# Patient Record
Sex: Female | Born: 1970
Health system: Southern US, Community
[De-identification: ages and names within clinical notes are randomized; demographics above are authoritative.]

## PROBLEM LIST (undated history)

## (undated) DIAGNOSIS — C801 Malignant (primary) neoplasm, unspecified: Secondary | ICD-10-CM

## (undated) DIAGNOSIS — F32A Depression, unspecified: Secondary | ICD-10-CM

## (undated) DIAGNOSIS — R200 Anesthesia of skin: Secondary | ICD-10-CM

## (undated) DIAGNOSIS — R51 Headache: Secondary | ICD-10-CM

## (undated) DIAGNOSIS — R159 Full incontinence of feces: Secondary | ICD-10-CM

## (undated) DIAGNOSIS — G4730 Sleep apnea, unspecified: Secondary | ICD-10-CM

## (undated) DIAGNOSIS — N3281 Overactive bladder: Secondary | ICD-10-CM

## (undated) DIAGNOSIS — F419 Anxiety disorder, unspecified: Secondary | ICD-10-CM

## (undated) DIAGNOSIS — R251 Tremor, unspecified: Secondary | ICD-10-CM

## (undated) DIAGNOSIS — M1990 Unspecified osteoarthritis, unspecified site: Secondary | ICD-10-CM

## (undated) DIAGNOSIS — D649 Anemia, unspecified: Secondary | ICD-10-CM

## (undated) DIAGNOSIS — F329 Major depressive disorder, single episode, unspecified: Secondary | ICD-10-CM

## (undated) HISTORY — PX: TONSILLECTOMY: SUR1361

## (undated) HISTORY — DX: Tremor, unspecified: R25.1

## (undated) HISTORY — PX: OTHER SURGICAL HISTORY: SHX169

## (undated) HISTORY — PX: URETERAL EXPLORATION: SHX2609

## (undated) HISTORY — PX: REMOVAL OF URINARY SLING: SHX6218

## (undated) HISTORY — PX: ABDOMINAL HYSTERECTOMY: SHX81

## (undated) HISTORY — DX: Full incontinence of feces: R15.9

## (undated) HISTORY — PX: VAGINAL HYSTERECTOMY: SUR661

## (undated) HISTORY — DX: Overactive bladder: N32.81

## (undated) HISTORY — PX: DILATION AND CURETTAGE OF UTERUS: SHX78

## (undated) HISTORY — DX: Anesthesia of skin: R20.0

---

## 1997-10-26 HISTORY — PX: COLONOSCOPY: SHX174

## 2000-05-14 ENCOUNTER — Inpatient Hospital Stay (HOSPITAL_COMMUNITY): Admission: AD | Admit: 2000-05-14 | Discharge: 2000-05-14 | Payer: Self-pay | Admitting: *Deleted

## 2000-12-07 ENCOUNTER — Ambulatory Visit (HOSPITAL_BASED_OUTPATIENT_CLINIC_OR_DEPARTMENT_OTHER): Admission: RE | Admit: 2000-12-07 | Discharge: 2000-12-07 | Payer: Self-pay | Admitting: Otolaryngology

## 2004-10-26 HISTORY — PX: FOOT SURGERY: SHX648

## 2012-01-27 ENCOUNTER — Ambulatory Visit: Payer: Self-pay | Admitting: Sports Medicine

## 2012-02-01 ENCOUNTER — Ambulatory Visit (INDEPENDENT_AMBULATORY_CARE_PROVIDER_SITE_OTHER): Payer: BC Managed Care – PPO | Admitting: Sports Medicine

## 2012-02-01 VITALS — BP 133/86 | Ht 63.0 in | Wt 128.0 lb

## 2012-02-01 DIAGNOSIS — M549 Dorsalgia, unspecified: Secondary | ICD-10-CM

## 2012-02-01 MED ORDER — TRAMADOL HCL 50 MG PO TABS: 50.0000 mg | ORAL_TABLET | Freq: Four times a day (QID) | ORAL | Status: AC | PRN

## 2012-02-01 MED ORDER — AMITRIPTYLINE HCL 25 MG PO TABS: 25.0000 mg | ORAL_TABLET | Freq: Every day | ORAL | Status: DC

## 2012-02-01 NOTE — Patient Instructions (Signed)
1. We have given you a medication to take at night which should relax the muscles and one for the daytime which is for pain.  2. Walking daily and gentle stretching motions of your back should also be helpful.  3. Follow up with [place] in about one month.

## 2012-02-01 NOTE — Progress Notes (Signed)
  Subjective:    Patient ID: Megan Gonzalez, female    DOB: 01/10/1971, 41 y.o.   MRN: [id number]  HPI 41 y/o female is here c/o lower back pain since September 2011.  It started after delivery.  She started having back pain immediately at the vaginal delivery.  She has had urinary incontinence since delivery.  She has incontinence due to a prolapse bladder and is currently awaiting surgical repair.  Bowels are controlled.  Back pain is isolated to the lower back.  No leg symptoms.  It is difficult for her to transfer out of a chair.  The pain is a consistent ache.  It is moderately controlled with ibuprofen.  Bending forward increases the pain.  No problem with walking.  She hasn't had any treatments at all for her back pain.  The urologist ordered an MRI.  She was told that the MRI was normal except for arthritis.   Review of Systems     Objective:   Physical Exam  She is in pain as she sits in the chair She transfers unassisted to the exam table Tenderness to palpation over the lumbar spine and paraspinal areas Motion is not restricted although she does get pain with extension No increased pain with FABER Hip girdle strength is decreased, right greater than left DTR's 2+ Negative straight leg raise Sensation is intact     Assessment & Plan:

## 2012-02-03 ENCOUNTER — Encounter: Payer: Self-pay | Admitting: Sports Medicine

## 2012-02-03 DIAGNOSIS — M549 Dorsalgia, unspecified: Secondary | ICD-10-CM | POA: Insufficient documentation

## 2012-02-03 NOTE — Assessment & Plan Note (Addendum)
For now we will be conservative with her treatment.  She will start a walking program and do gentle stretching.  If she continues to have pain after her surgery we will review her previous MRI and plain films to more closely evaluate her facets.  Note review of MRI did not show extensive DJD so I suspect there are other factors in LBP

## 2012-02-23 ENCOUNTER — Other Ambulatory Visit: Payer: Self-pay | Admitting: Urology

## 2012-02-23 MED ORDER — BUPIVACAINE-EPINEPHRINE 0.5% -1:200000 IJ SOLN: 20.0000 mL | Freq: Once | INTRAMUSCULAR | Status: DC

## 2012-02-29 ENCOUNTER — Encounter (HOSPITAL_COMMUNITY): Payer: Self-pay | Admitting: *Deleted

## 2012-02-29 NOTE — Progress Notes (Signed)
Upon obtaining patient's medical history, patient informed me that she had sleep apnea and uses a cpap.  Scheduled a PAT appt.

## 2012-03-02 ENCOUNTER — Ambulatory Visit: Payer: BC Managed Care – PPO | Admitting: Sports Medicine

## 2012-03-09 ENCOUNTER — Encounter (HOSPITAL_COMMUNITY): Payer: Self-pay | Admitting: Pharmacist

## 2012-03-16 ENCOUNTER — Encounter (HOSPITAL_COMMUNITY)
Admission: RE | Admit: 2012-03-16 | Discharge: 2012-03-16 | Disposition: A | Discharge status: Home / Self Care | Payer: BC Managed Care – PPO | Source: Ambulatory Visit | Attending: Obstetrics and Gynecology | Admitting: Obstetrics and Gynecology

## 2012-03-16 ENCOUNTER — Encounter (HOSPITAL_COMMUNITY): Payer: Self-pay

## 2012-03-16 HISTORY — DX: Depression, unspecified: F32.A

## 2012-03-16 HISTORY — DX: Unspecified osteoarthritis, unspecified site: M19.90

## 2012-03-16 HISTORY — DX: Major depressive disorder, single episode, unspecified: F32.9

## 2012-03-16 HISTORY — DX: Headache: R51

## 2012-03-16 HISTORY — DX: Anemia, unspecified: D64.9

## 2012-03-16 LAB — CBC
HCT: 36 % (ref 36.0–46.0)
Hemoglobin: 11.5 g/dL — ABNORMAL LOW (ref 12.0–15.0)
MCV: 81.3 fL (ref 78.0–100.0)
RBC: 4.43 MIL/uL (ref 3.87–5.11)
WBC: 4.3 10*3/uL (ref 4.0–10.5)

## 2012-03-16 NOTE — Patient Instructions (Addendum)
   Your procedure is scheduled on: Thursday May 30th  Enter through the Main Entrance of [facility] at:6am Pick up the phone at the desk and dial [phone] and inform us of your arrival.  Please call this number if you have any problems the morning of surgery: [phone]  Remember: Do not eat food after midnight: Wednesday Do not drink clear liquids after:midnight Wednesday Take these medicines the morning of surgery with a SIP OF WATER: wellbutrin  Do not wear jewelry, make-up, or FINGER nail polish Do not wear lotions, powders, perfumes or deodorant. Do not shave 48 hours prior to surgery. Do not bring valuables to the hospital. Contacts, dentures or bridgework may not be worn into surgery.    Patients discharged on the day of surgery will not be allowed to drive home.     Remember to use your hibiclens as instructed.Please shower with 1/2 bottle the evening before your surgery and the other 1/2 bottle the morning of surgery. Neck down avoiding private area.

## 2012-03-16 NOTE — Pre-Procedure Instructions (Signed)
Pt needed to use wheelchair after arriving to admitting for pat-lower back pain

## 2012-03-17 ENCOUNTER — Other Ambulatory Visit: Payer: Self-pay | Admitting: Obstetrics and Gynecology

## 2012-03-23 MED ORDER — ACETAMINOPHEN 10 MG/ML IV SOLN
1000.0000 mg | Freq: Once | INTRAVENOUS | Status: AC
  Administered 2012-03-24: 1000 mg via INTRAVENOUS
  Filled 2012-03-23: qty 100

## 2012-03-23 MED ORDER — CIPROFLOXACIN IN D5W 400 MG/200ML IV SOLN
400.0000 mg | Freq: Once | INTRAVENOUS | Status: AC
  Administered 2012-03-24: 400 mg via INTRAVENOUS
  Filled 2012-03-23: qty 200

## 2012-03-23 MED ORDER — CIPROFLOXACIN IN D5W 400 MG/200ML IV SOLN
400.0000 mg | INTRAVENOUS | Status: DC
  Filled 2012-03-23: qty 200

## 2012-03-24 ENCOUNTER — Ambulatory Visit (HOSPITAL_COMMUNITY): Payer: BC Managed Care – PPO | Admitting: Anesthesiology

## 2012-03-24 ENCOUNTER — Encounter (HOSPITAL_COMMUNITY): Payer: Self-pay | Admitting: Anesthesiology

## 2012-03-24 ENCOUNTER — Ambulatory Visit (HOSPITAL_COMMUNITY)
Admission: RE | Admit: 2012-03-24 | Discharge: 2012-03-25 | Disposition: A | Discharge status: Home / Self Care | Payer: BC Managed Care – PPO | Source: Ambulatory Visit | Attending: Obstetrics and Gynecology | Admitting: Obstetrics and Gynecology

## 2012-03-24 ENCOUNTER — Encounter (HOSPITAL_COMMUNITY): Payer: Self-pay

## 2012-03-24 ENCOUNTER — Encounter (HOSPITAL_COMMUNITY): Payer: Self-pay | Admitting: *Deleted

## 2012-03-24 ENCOUNTER — Encounter (HOSPITAL_COMMUNITY)
Admission: RE | Disposition: A | Discharge status: Home / Self Care | Payer: Self-pay | Source: Ambulatory Visit | Attending: Obstetrics and Gynecology

## 2012-03-24 DIAGNOSIS — R32 Unspecified urinary incontinence: Secondary | ICD-10-CM

## 2012-03-24 DIAGNOSIS — N920 Excessive and frequent menstruation with regular cycle: Secondary | ICD-10-CM | POA: Insufficient documentation

## 2012-03-24 DIAGNOSIS — N840 Polyp of corpus uteri: Secondary | ICD-10-CM | POA: Insufficient documentation

## 2012-03-24 DIAGNOSIS — M549 Dorsalgia, unspecified: Secondary | ICD-10-CM

## 2012-03-24 DIAGNOSIS — N393 Stress incontinence (female) (male): Secondary | ICD-10-CM | POA: Insufficient documentation

## 2012-03-24 HISTORY — DX: Sleep apnea, unspecified: G47.30

## 2012-03-24 HISTORY — PX: PUBOVAGINAL SLING: SHX1035

## 2012-03-24 LAB — PREGNANCY, URINE: Preg Test, Ur: NEGATIVE

## 2012-03-24 SURGERY — DILATATION & CURETTAGE/HYSTEROSCOPY WITH RESECTOCOPE
Anesthesia: General | Site: Vagina | Wound class: Clean Contaminated

## 2012-03-24 MED ORDER — KETOROLAC TROMETHAMINE 30 MG/ML IJ SOLN
INTRAMUSCULAR | Status: DC | PRN
  Administered 2012-03-24: 30 mg via INTRAVENOUS

## 2012-03-24 MED ORDER — ONDANSETRON HCL 4 MG/2ML IJ SOLN
INTRAMUSCULAR | Status: AC
  Filled 2012-03-24: qty 2

## 2012-03-24 MED ORDER — KETOROLAC TROMETHAMINE 30 MG/ML IJ SOLN: 15.0000 mg | Freq: Once | INTRAMUSCULAR | Status: DC | PRN

## 2012-03-24 MED ORDER — CIPROFLOXACIN IN D5W 400 MG/200ML IV SOLN
400.0000 mg | Freq: Two times a day (BID) | INTRAVENOUS | Status: AC
  Administered 2012-03-24 – 2012-03-25 (×2): 400 mg via INTRAVENOUS
  Filled 2012-03-24 (×2): qty 200

## 2012-03-24 MED ORDER — LIDOCAINE HCL (CARDIAC) 20 MG/ML IV SOLN
INTRAVENOUS | Status: AC
  Filled 2012-03-24: qty 5

## 2012-03-24 MED ORDER — BUPIVACAINE-EPINEPHRINE PF 0.25-1:200000 % IJ SOLN
INTRAMUSCULAR | Status: AC
  Filled 2012-03-24: qty 30

## 2012-03-24 MED ORDER — ZOLPIDEM TARTRATE 5 MG PO TABS: 5.0000 mg | ORAL_TABLET | Freq: Every evening | ORAL | Status: DC | PRN

## 2012-03-24 MED ORDER — KETOROLAC TROMETHAMINE 30 MG/ML IJ SOLN: 30.0000 mg | Freq: Four times a day (QID) | INTRAMUSCULAR | Status: DC

## 2012-03-24 MED ORDER — MIDAZOLAM HCL 2 MG/2ML IJ SOLN
INTRAMUSCULAR | Status: AC
  Filled 2012-03-24: qty 2

## 2012-03-24 MED ORDER — ACETAMINOPHEN 10 MG/ML IV SOLN
1000.0000 mg | Freq: Four times a day (QID) | INTRAVENOUS | Status: DC
  Administered 2012-03-24 – 2012-03-25 (×3): 1000 mg via INTRAVENOUS
  Filled 2012-03-24 (×5): qty 100

## 2012-03-24 MED ORDER — DIPHENHYDRAMINE HCL 12.5 MG/5ML PO ELIX: 12.5000 mg | ORAL_SOLUTION | Freq: Four times a day (QID) | ORAL | Status: DC | PRN

## 2012-03-24 MED ORDER — LACTATED RINGERS IV SOLN
INTRAVENOUS | Status: DC
  Administered 2012-03-24 (×2): via INTRAVENOUS

## 2012-03-24 MED ORDER — STERILE WATER FOR IRRIGATION IR SOLN
Status: DC | PRN
  Administered 2012-03-24: 1000 mL via INTRAVESICAL

## 2012-03-24 MED ORDER — PROPOFOL 10 MG/ML IV EMUL
INTRAVENOUS | Status: DC | PRN
  Administered 2012-03-24: 160 mg via INTRAVENOUS

## 2012-03-24 MED ORDER — ESTRADIOL 0.1 MG/GM VA CREA
TOPICAL_CREAM | VAGINAL | Status: DC | PRN
  Administered 2012-03-24: 1 via VAGINAL

## 2012-03-24 MED ORDER — BELLADONNA ALKALOIDS-OPIUM 16.2-60 MG RE SUPP
1.0000 | Freq: Once | RECTAL | Status: AC
  Administered 2012-03-24: 1 via RECTAL

## 2012-03-24 MED ORDER — AMPHETAMINE-DEXTROAMPHET ER 25 MG PO CP24: 25.0000 mg | ORAL_CAPSULE | Freq: Two times a day (BID) | ORAL | Status: DC

## 2012-03-24 MED ORDER — FENTANYL CITRATE 0.05 MG/ML IJ SOLN
INTRAMUSCULAR | Status: DC | PRN
  Administered 2012-03-24: 100 ug via INTRAVENOUS

## 2012-03-24 MED ORDER — CHLOROPROCAINE HCL 1 % IJ SOLN
INTRAMUSCULAR | Status: DC | PRN
  Administered 2012-03-24: 10 mL

## 2012-03-24 MED ORDER — ONDANSETRON HCL 4 MG/2ML IJ SOLN
4.0000 mg | INTRAMUSCULAR | Status: DC | PRN
  Filled 2012-03-24: qty 2

## 2012-03-24 MED ORDER — LIDOCAINE HCL (CARDIAC) 20 MG/ML IV SOLN
INTRAVENOUS | Status: DC | PRN
  Administered 2012-03-24: 50 mg via INTRAVENOUS

## 2012-03-24 MED ORDER — FENTANYL CITRATE 0.05 MG/ML IJ SOLN
INTRAMUSCULAR | Status: AC
  Administered 2012-03-24: 50 ug via INTRAVENOUS
  Filled 2012-03-24: qty 2

## 2012-03-24 MED ORDER — GLYCINE 1.5 % IR SOLN
Status: DC | PRN
  Administered 2012-03-24: 3000 mL

## 2012-03-24 MED ORDER — CHLOROPROCAINE HCL 1 % IJ SOLN
INTRAMUSCULAR | Status: AC
  Filled 2012-03-24: qty 30

## 2012-03-24 MED ORDER — FENTANYL CITRATE 0.05 MG/ML IJ SOLN
25.0000 ug | INTRAMUSCULAR | Status: DC | PRN
  Administered 2012-03-24: 50 ug via INTRAVENOUS

## 2012-03-24 MED ORDER — INDIGOTINDISULFONATE SODIUM 8 MG/ML IJ SOLN
INTRAMUSCULAR | Status: DC | PRN
  Administered 2012-03-24: 40 mg via INTRAVENOUS

## 2012-03-24 MED ORDER — BUPROPION HCL ER (XL) 300 MG PO TB24
450.0000 mg | ORAL_TABLET | Freq: Every morning | ORAL | Status: DC
  Administered 2012-03-25: 450 mg via ORAL
  Filled 2012-03-24: qty 1

## 2012-03-24 MED ORDER — KETOROLAC TROMETHAMINE 30 MG/ML IJ SOLN
30.0000 mg | Freq: Four times a day (QID) | INTRAMUSCULAR | Status: DC
  Filled 2012-03-24: qty 1

## 2012-03-24 MED ORDER — BISACODYL 10 MG RE SUPP: 10.0000 mg | Freq: Every day | RECTAL | Status: DC | PRN

## 2012-03-24 MED ORDER — DEXAMETHASONE SODIUM PHOSPHATE 10 MG/ML IJ SOLN
INTRAMUSCULAR | Status: AC
  Filled 2012-03-24: qty 1

## 2012-03-24 MED ORDER — SODIUM CHLORIDE 0.9 % IR SOLN: 1.0000 "application " | Freq: Once | Status: DC

## 2012-03-24 MED ORDER — MIDAZOLAM HCL 5 MG/5ML IJ SOLN
INTRAMUSCULAR | Status: DC | PRN
  Administered 2012-03-24: 2 mg via INTRAVENOUS

## 2012-03-24 MED ORDER — BELLADONNA ALKALOIDS-OPIUM 16.2-60 MG RE SUPP
RECTAL | Status: AC
  Filled 2012-03-24: qty 1

## 2012-03-24 MED ORDER — LORATADINE 10 MG PO TABS
10.0000 mg | ORAL_TABLET | Freq: Every morning | ORAL | Status: DC
  Administered 2012-03-25: 10 mg via ORAL
  Filled 2012-03-24 (×2): qty 1

## 2012-03-24 MED ORDER — ONDANSETRON HCL 4 MG/2ML IJ SOLN
4.0000 mg | Freq: Four times a day (QID) | INTRAMUSCULAR | Status: DC | PRN
  Administered 2012-03-24 (×2): 4 mg via INTRAVENOUS
  Filled 2012-03-24: qty 2

## 2012-03-24 MED ORDER — MENTHOL 3 MG MT LOZG: 1.0000 | LOZENGE | OROMUCOSAL | Status: DC | PRN

## 2012-03-24 MED ORDER — KETOROLAC TROMETHAMINE 30 MG/ML IJ SOLN
30.0000 mg | Freq: Four times a day (QID) | INTRAMUSCULAR | Status: DC
  Administered 2012-03-24 – 2012-03-25 (×4): 30 mg via INTRAVENOUS
  Filled 2012-03-24 (×3): qty 1

## 2012-03-24 MED ORDER — INDIGOTINDISULFONATE SODIUM 8 MG/ML IJ SOLN
INTRAMUSCULAR | Status: AC
  Filled 2012-03-24: qty 5

## 2012-03-24 MED ORDER — FENTANYL CITRATE 0.05 MG/ML IJ SOLN
INTRAMUSCULAR | Status: AC
  Filled 2012-03-24: qty 5

## 2012-03-24 MED ORDER — SILVER NITRATE-POT NITRATE 75-25 % EX MISC
CUTANEOUS | Status: AC
  Filled 2012-03-24: qty 6

## 2012-03-24 MED ORDER — CIPROFLOXACIN IN D5W 400 MG/200ML IV SOLN
400.0000 mg | Freq: Two times a day (BID) | INTRAVENOUS | Status: DC
  Filled 2012-03-24 (×2): qty 200

## 2012-03-24 MED ORDER — DEXAMETHASONE SODIUM PHOSPHATE 10 MG/ML IJ SOLN
INTRAMUSCULAR | Status: DC | PRN
  Administered 2012-03-24: 10 mg via INTRAVENOUS

## 2012-03-24 MED ORDER — ACETAMINOPHEN 10 MG/ML IV SOLN: 1000.0000 mg | Freq: Four times a day (QID) | INTRAVENOUS | Status: DC

## 2012-03-24 MED ORDER — PANTOPRAZOLE SODIUM 40 MG PO TBEC
40.0000 mg | DELAYED_RELEASE_TABLET | Freq: Every day | ORAL | Status: DC
  Filled 2012-03-24 (×2): qty 1

## 2012-03-24 MED ORDER — BUPIVACAINE HCL (PF) 0.25 % IJ SOLN
INTRAMUSCULAR | Status: AC
  Filled 2012-03-24: qty 30

## 2012-03-24 MED ORDER — SODIUM CHLORIDE 0.9 % IR SOLN
Freq: Once | Status: DC
  Filled 2012-03-24: qty 1

## 2012-03-24 MED ORDER — HYDROMORPHONE HCL PF 1 MG/ML IJ SOLN: 0.2000 mg | INTRAMUSCULAR | Status: DC | PRN

## 2012-03-24 MED ORDER — SODIUM CHLORIDE 0.9 % IR SOLN
Status: DC | PRN
  Administered 2012-03-24: 09:00:00

## 2012-03-24 MED ORDER — ESTRADIOL 0.1 MG/GM VA CREA
TOPICAL_CREAM | VAGINAL | Status: AC
  Filled 2012-03-24: qty 42.5

## 2012-03-24 MED ORDER — ONDANSETRON HCL 4 MG PO TABS: 4.0000 mg | ORAL_TABLET | Freq: Four times a day (QID) | ORAL | Status: DC | PRN

## 2012-03-24 MED ORDER — BUPIVACAINE-EPINEPHRINE PF 0.25-1:200000 % IJ SOLN
INTRAMUSCULAR | Status: DC | PRN
  Administered 2012-03-24: 18 mL

## 2012-03-24 MED ORDER — DIPHENHYDRAMINE HCL 50 MG/ML IJ SOLN: 12.5000 mg | Freq: Four times a day (QID) | INTRAMUSCULAR | Status: DC | PRN

## 2012-03-24 MED ORDER — PROPOFOL 10 MG/ML IV EMUL
INTRAVENOUS | Status: AC
  Filled 2012-03-24: qty 20

## 2012-03-24 MED ORDER — KETOROLAC TROMETHAMINE 30 MG/ML IJ SOLN
INTRAMUSCULAR | Status: AC
  Filled 2012-03-24: qty 1

## 2012-03-24 MED ORDER — ONDANSETRON HCL 4 MG/2ML IJ SOLN
INTRAMUSCULAR | Status: DC | PRN
  Administered 2012-03-24: 4 mg via INTRAVENOUS

## 2012-03-24 SURGICAL SUPPLY — 54 items
ADH SKN CLS APL DERMABOND .7 (GAUZE/BANDAGES/DRESSINGS) ×3
BLADE SURG 10 STRL SS (BLADE) ×6 IMPLANT
BLADE SURG 15 STRL LF C SS BP (BLADE) ×3 IMPLANT
BLADE SURG 15 STRL SS (BLADE) ×4
BLADE SURG CLIPPER 3M 9600 (MISCELLANEOUS) ×1 IMPLANT
BUPIVACAINE .25% WITH EPI 1:200,000 ×1 IMPLANT
CANISTER SUCTION 2500CC (MISCELLANEOUS) ×8 IMPLANT
CATH FOLEY 2WAY SLVR  5CC 16FR (CATHETERS) ×1
CATH FOLEY 2WAY SLVR 5CC 16FR (CATHETERS) ×3 IMPLANT
CATH ROBINSON RED A/P 16FR (CATHETERS) ×4 IMPLANT
CLOTH BEACON ORANGE TIMEOUT ST (SAFETY) ×4 IMPLANT
CONTAINER PREFILL 10% NBF 60ML (FORM) ×7 IMPLANT
COUNTER NEEDLE 1200 MAGNETIC (NEEDLE) ×1 IMPLANT
DECANTER SPIKE VIAL GLASS SM (MISCELLANEOUS) ×2 IMPLANT
DERMABOND ADVANCED (GAUZE/BANDAGES/DRESSINGS) ×1
DERMABOND ADVANCED .7 DNX12 (GAUZE/BANDAGES/DRESSINGS) ×3 IMPLANT
DRAIN PENROSE 1/2X12 (DRAIN) IMPLANT
DRAPE HYSTEROSCOPY (DRAPE) ×1 IMPLANT
ELECT REM PT RETURN 9FT ADLT (ELECTROSURGICAL) ×4
ELECTRODE REM PT RTRN 9FT ADLT (ELECTROSURGICAL) ×3 IMPLANT
ELECTRODE ROLLER VERSAPOINT (ELECTRODE) IMPLANT
ELECTRODE RT ANGLE VERSAPOINT (CUTTING LOOP) IMPLANT
GAUZE PACKING 2X5 YD STERILE (GAUZE/BANDAGES/DRESSINGS) IMPLANT
GAUZE SPONGE 4X4 16PLY XRAY LF (GAUZE/BANDAGES/DRESSINGS) ×1 IMPLANT
GLOVE BIO SURGEON STRL SZ 6.5 (GLOVE) ×4 IMPLANT
GLOVE BIO SURGEON STRL SZ7.5 (GLOVE) ×4 IMPLANT
GLOVE BIOGEL PI IND STRL 6.5 (GLOVE) IMPLANT
GLOVE BIOGEL PI IND STRL 7.0 (GLOVE) ×6 IMPLANT
GLOVE BIOGEL PI INDICATOR 6.5 (GLOVE) ×2
GLOVE BIOGEL PI INDICATOR 7.0 (GLOVE) ×3
GLOVE ECLIPSE 6.0 STRL STRAW (GLOVE) ×3 IMPLANT
GLOVE SURG SS PI 7.0 STRL IVOR (GLOVE) ×1 IMPLANT
GOWN PREVENTION PLUS LG XLONG (DISPOSABLE) ×12 IMPLANT
GOWN STRL REIN XL XLG (GOWN DISPOSABLE) ×4 IMPLANT
LONE STAR RETRACTOR RING ×1 IMPLANT
LOOP ANGLED CUTTING 22FR (CUTTING LOOP) IMPLANT
NEEDLE HYPO 22GX1.5 SAFETY (NEEDLE) ×6 IMPLANT
PACK HYSTEROSCOPY LF (CUSTOM PROCEDURE TRAY) ×4 IMPLANT
PACK VAGINAL WOMENS (CUSTOM PROCEDURE TRAY) IMPLANT
PENCIL BUTTON HOLSTER BLD 10FT (ELECTRODE) ×4 IMPLANT
PLUG CATH AND CAP STER (CATHETERS) ×5 IMPLANT
PROTECTOR NERVE ULNAR (MISCELLANEOUS) ×2 IMPLANT
RETRACTOR STAY HOOK 5MM (MISCELLANEOUS) ×1 IMPLANT
SET CYSTO W/LG BORE CLAMP LF (SET/KITS/TRAYS/PACK) ×1 IMPLANT
SLING LYNX SUPRAPUBIC (Sling) ×1 IMPLANT
SUT VIC AB 2-0 UR6 27 (SUTURE) ×1 IMPLANT
SYR BULB IRRIGATION 50ML (SYRINGE) ×4 IMPLANT
SYR CONTROL 10ML LL (SYRINGE) ×2 IMPLANT
TOWEL OR 17X24 6PK STRL BLUE (TOWEL DISPOSABLE) ×16 IMPLANT
TRAY FOLEY CATH 14FR (SET/KITS/TRAYS/PACK) IMPLANT
TUBING NON-CON 1/4 X 20 CONN (TUBING) ×4 IMPLANT
TUBING SUCTION BULK 100 FT (MISCELLANEOUS) ×1 IMPLANT
WATER STERILE IRR 1000ML POUR (IV SOLUTION) ×8 IMPLANT
YANKAUER SUCT BULB TIP NO VENT (SUCTIONS) ×5 IMPLANT

## 2012-03-24 NOTE — Anesthesia Postprocedure Evaluation (Signed)
Anesthesia Post Note  Patient: Megan Gonzalez  Procedure(s) Performed: Procedure(s) (LRB): DILATATION & CURETTAGE/HYSTEROSCOPY WITH RESECTOCOPE (N/A) PUBO-VAGINAL SLING (N/A) CYSTO (N/A)  Anesthesia type: General  Patient location: PACU  Post pain: Pain level controlled  Post assessment: Post-op Vital signs reviewed  Last Vitals:  Filed Vitals:   03/24/12 0939  BP: 134/79  Pulse: 95  Temp: 36.6 C  Resp: 16    Post vital signs: Reviewed  Level of consciousness: sedated  Complications: No apparent anesthesia complicationsfj

## 2012-03-24 NOTE — Op Note (Signed)
NAME:  Megan Gonzalez, Megan Gonzalez NO.:  [id number]  MEDICAL RECORD NO.:  [id number]  LOCATION:  [facility]                          FACILITY:  [facility]  PHYSICIAN:  Maxie Better, M.D.DATE OF BIRTH:  September 13, 1971  DATE OF PROCEDURE:  03/24/2012 DATE OF DISCHARGE:                              OPERATIVE REPORT   PREOPERATIVE DIAGNOSES:  Menometrorrhagia, endometrial mass.  POSTOPERATIVE DIAGNOSES:  Menometrorrhagia, endometrial mass.  PROCEDURE:  Diagnostic hysteroscopy, D and C.  ANESTHESIA:  General, paracervical block.  SURGEON:  Maxie Better, MD  ASSISTANT:  None.  PROCEDURE:  Under adequate general anesthesia, the patient, who was positioned prior to being put to sleep was sterilely prepped and draped in usual fashion.  The bladder was catheterized for scant amount of urine.  Examination under anesthesia revealed anteverted uterus.  No adnexal masses could be appreciated.  The patient has a first-degree uterine prolapse.  Bivalve speculum was placed in the vagina.  A 10 mL of 1% Nesacaine was injected paracervical at the 3 and 9 o'clock position.  The anterior lip of the cervix was grasped with a single- tooth tenaculum.  The cervix was the transverse os.  Cervix serially dilated to a #31 Pratt dilator.  Resectoscope with a single loop was inserted into the uterine cavity.  Multiple polypoid appearing endometrium was noted but no discrete polyp per se was seen.  Both tubal ostia were seen.  No masses in endocervical canal was noted.  The cavity was subsequently curetted, reinspected with the hysteroscope, and had resection on the left of what appeared to be a flat polypoid lesion at that point.  After that was done, the cavity was once again curetted. When all tissue was felt to be removed, the procedure was terminated by removing tenaculum and vaginal speculum.  The patient then underwent sling procedure by Dr. Patsi Sears.  Please see his dictation for  the specifics.  Estimated blood loss in my part was minimal.  Fluid deficit was 100 mL.  Specimen was endometrial curetting, resection sent to pathology.  Complication was none.  The patient tolerated the procedure well, will be transferred to recovery room in stable condition.     Maxie Better, M.D.     Volin/MEDQ  D:  03/24/2012  T:  03/24/2012  Job:  [id number]

## 2012-03-24 NOTE — Transfer of Care (Signed)
Immediate Anesthesia Transfer of Care Note  Patient: Megan Gonzalez  Procedure(s) Performed: Procedure(s) (LRB): DILATATION & CURETTAGE/HYSTEROSCOPY WITH RESECTOCOPE (N/A) PUBO-VAGINAL SLING (N/A) CYSTO ()  Patient Location: PACU  Anesthesia Type: General  Level of Consciousness: sedated  Airway & Oxygen Therapy: Patient Spontanous Breathing and Patient connected to nasal cannula oxygen  Post-op Assessment: Report given to PACU RN  Post vital signs: Reviewed and stable  Complications: No apparent anesthesia complications

## 2012-03-24 NOTE — Brief Op Note (Signed)
03/24/2012  9:03 AM  PATIENT:  Megan Gonzalez  41 y.o. female  PRE-OPERATIVE DIAGNOSIS:  Menometrorrhagia, Endometrial Mass  POST-OPERATIVE DIAGNOSIS:  Menometrorrhagia, Endometrial Mass  PROCEDURE:  Procedure(s) (LRB): DILATATION & CURETTAGE/DIAGNOSTIC HYSTEROSCOPY  SURGEON:  Surgeon(s) and Role: Panel 1:    * Joylyn Duggin Cathie Beams, MD - Primary  Panel 2:    * Kathi Ludwig, MD - Primary  PHYSICIAN ASSISTANT:   ASSISTANTS: none   ANESTHESIA:   general, PARACERVICAL BLOCK  FINDINGS: TUBAL OSTIA SEEEN , POLYPOID ENDOMETRIAL TISSUE NO ENDOCERVICAL MASS EBL:  Total I/O In: 500 [I.V.:500] Out: 45 [Urine:25; Blood:20]  BLOOD ADMINISTERED:none  DRAINS: none   LOCAL MEDICATIONS USED:  OTHER NESICAINE  SPECIMEN:  Source of Specimen:  [facility]  DISPOSITION OF SPECIMEN:  PATHOLOGY  COUNTS:  YES  TOURNIQUET:  * No tourniquets in log *  DICTATION: .Other Dictation: Dictation Number [id number]  PLAN OF CARE: Admit for overnight observation  PATIENT DISPOSITION:  PACU - hemodynamically stable.   Delay start of Pharmacological VTE agent (>24hrs) due to surgical blood loss or risk of bleeding: no

## 2012-03-24 NOTE — Op Note (Signed)
Pre-operative diagnosis : Stress urinary incontinence  Postoperative diagnosis: Same  Operation: [place] pubovaginal sling  Surgeon:  S. Patsi Sears, MD  First assistant: None  Anesthesia:  general  Preparation: After appropriate preanesthesia, the patient was brought to the operating room, and placed on the operating table in the dorsal supine position. Armband was rechecked. She was placed in the dorsal lithotomy position, where the pubis was prepped with Betadine solution draped in usual fashion. Timeout was accomplished, and the patient then underwent procedure per Dr. cousins.  Following this, the patient underwent a second timeout, and then was prepared for her anti-incontinence procedure. She required shaving of the pubic hair in the suprapubic space.  Review history: The patient is a 41 year old female, para 4-3-1, with a history of traumatic vaginal delivery in September of 2011 in [place]. She delivered an 8 pound, 6 ounce child, which remained "stuck" within the endocervical canal for several hours. After delivery, the patient noted total urinary incontinence, which did not resolve over time. She underwent physical therapy, which helped somewhat, but did not cure her stress incontinence. She was seen in consultation [facility], advised to have pubovaginal sling, but was unable to have surgery at that time. She has failed anticholinergic medications, and now presents for pubovaginal sling surgery.  Statement of  Likelihood of Success: Excellent. TIME-OUT observed.:  Procedure: Using a marking pen, outline of suprapubic incisions are made 2 fingerbreadths above the pubic tubercle, and lateral. In addition, Foley catheters placed, and the bladder neck is outlined. The mid urethra is also outlined. Marcaine 25% with epinephrine 1 200,000, 10 cc, was injected into the periurethral space, and, with a separate needle, into the suprapubic incision areas.  Using a fresh 10 blade, incisions were made in the suprapubic sites. Using a fresh 15 blade, and with the [place] retractor in place, and Foley catheter placed, a 2 cm incision is made in the mid urethra, previously marked. Subcutaneous tissue was dissected, with care taken to avoid injury to the urethra area dissection is carried lateralward, to the level of the pubis, and the obturator internus. This is accomplished with both sharp and blunt dissection. The [place] needles were then placed bilaterally, after draining the bladder fluid, and placing the patient in a dorsal lithotomy and in Trendelenburg position. Once the [place] needles were placed, cystoscopy was accomplished, with indigo carmine was given, and blue jets of urine were seen bilaterally from the orifices, which were normally located on the trigone. There was no evidence of bladder stone, tumor, or diverticular formation. There was no blood within the bladder. There was no evidence of needles within the bladder.    The [place] sling was then placed, and with the right angle behind the midline for tentioning, was brought into the retropubic space. No tension on the [place] sling was noted. The central blue tab was removed, and the plastic sleeves were removed. The right angle  clamp remained for tensioning, and the mesh sleeves were then incised subcutaneously. The mesh was smooth and flat and loose against the urethra. The mid urethral incision was then closed with 2-0 Vicryl suture, and the suprapubic incisions were closed with Dermabond. Foley cath was left in place. Vaginal packing with Estrace was in place, with a blue string outside the vagina. The patient had received a B. and O. suppository, IV Toradol, and IV Tylenol. She was awakened, taken recovery room in good condition.

## 2012-03-24 NOTE — OR Nursing (Signed)
0830 Dr.Tannenbaum injected Bupivacaine .25% w/ Epi 1:200,000 to vaginal mucosa.

## 2012-03-24 NOTE — Anesthesia Preprocedure Evaluation (Addendum)
Anesthesia Evaluation  Patient identified by MRN, date of birth, ID band Patient awake    Reviewed: Allergy & Precautions, H&P , NPO status , Patient's Chart, lab work & pertinent test results, reviewed documented beta blocker date and time   History of Anesthesia Complications Negative for: history of anesthetic complications  Airway Mallampati: III      Dental  (+) Teeth Intact   Pulmonary sleep apnea (non-compliant with CPAP) ,  breath sounds clear to auscultation  Pulmonary exam normal       Cardiovascular Exercise Tolerance: Good Rhythm:regular Rate:Normal     Neuro/Psych  Headaches, PSYCHIATRIC DISORDERS (depression) Chronic low back pain    GI/Hepatic negative GI ROS, Neg liver ROS,   Endo/Other  negative endocrine ROS  Renal/GU negative Renal ROS     Musculoskeletal   Abdominal   Peds  Hematology negative hematology ROS (+)   Anesthesia Other Findings   Reproductive/Obstetrics negative OB ROS                          Anesthesia Physical Anesthesia Plan  ASA: II  Anesthesia Plan: General LMA   Post-op Pain Management:    Induction:   Airway Management Planned:   Additional Equipment:   Intra-op Plan:   Post-operative Plan:   Informed Consent: I have reviewed the patients History and Physical, chart, labs and discussed the procedure including the risks, benefits and alternatives for the proposed anesthesia with the patient or authorized representative who has indicated his/her understanding and acceptance.   Dental Advisory Given  Plan Discussed with: CRNA and Surgeon  Anesthesia Plan Comments:         Anesthesia Quick Evaluation

## 2012-03-25 ENCOUNTER — Encounter (HOSPITAL_COMMUNITY): Payer: Self-pay | Admitting: Obstetrics and Gynecology

## 2012-03-25 ENCOUNTER — Inpatient Hospital Stay (HOSPITAL_COMMUNITY)
Admission: AD | Admit: 2012-03-25 | Discharge: 2012-03-26 | Disposition: A | Discharge status: Home / Self Care | Payer: BC Managed Care – PPO | Source: Ambulatory Visit | Attending: Obstetrics & Gynecology | Admitting: Obstetrics & Gynecology

## 2012-03-25 DIAGNOSIS — R339 Retention of urine, unspecified: Secondary | ICD-10-CM | POA: Insufficient documentation

## 2012-03-25 DIAGNOSIS — N9989 Other postprocedural complications and disorders of genitourinary system: Secondary | ICD-10-CM | POA: Insufficient documentation

## 2012-03-25 DIAGNOSIS — R338 Other retention of urine: Secondary | ICD-10-CM

## 2012-03-25 DIAGNOSIS — IMO0002 Reserved for concepts with insufficient information to code with codable children: Secondary | ICD-10-CM | POA: Insufficient documentation

## 2012-03-25 MED ORDER — TRIMETHOPRIM 100 MG PO TABS: 100.0000 mg | ORAL_TABLET | ORAL | Status: AC

## 2012-03-25 MED ORDER — HYDROCODONE-ACETAMINOPHEN 7.5-650 MG PO TABS: 1.0000 | ORAL_TABLET | Freq: Four times a day (QID) | ORAL | Status: AC | PRN

## 2012-03-25 NOTE — Discharge Instructions (Signed)
HOME CARE INSTRUCTIONS FOR VAGINAL SLING  Activity:  -No lifting greater than 10-15 pounds for  3 weeks, or as instructed by your  physician.  -No driving a car for one week.  -No sexual intercourse for  6 weeks.  Diet:  You may return to your normal diet tomorrow.   It is important to keep your   bowels regular during the postoperative period.  To avoid constipation, drink   plenty of fluids during the day (8-10 glasses) and eat plenty of fresh fruits and   vegetables.  Use a mild laxative or stool softener if necessary.  Wound Care:  You may begin showering in  1 days, or as instructed by your physician.  .   Return to Work as instructed by your physician.    Special Instructions:   Call your physician if any of these symptoms occur:   -temperature greater than 101 degrees Farenheit.   -redness, swelling or drainage at incision site.   -foul odor of your urine.   -a significant decrease in the amount of urine you have every day.   -severe pain not relieved by your pain medication.   Trial Voiding - refer to Trial Voiding Fact Sheet.  Return to see your doctor per appointment Call to set up a follow-up appointment.  Patient Signature:  ________________________________________________________  Nurse's Signature:  ________________________________________________________

## 2012-03-25 NOTE — Progress Notes (Signed)
  Subjective: Patient reports catheter removed at 8 am. No void yet, but she is drinking fluids.  Objective: Vital signs in last 24 hours: Temp:  [97.9 F (36.6 C)-98.5 F (36.9 C)] 98.2 F (36.8 C) (05/31 0545) Pulse Rate:  [82-95] 88  (05/31 0545) Resp:  [12-19] 18  (05/31 0545) BP: (97-134)/(59-80) 104/68 mmHg (05/31 0545) SpO2:  [99 %-100 %] 100 % (05/31 0545) Weight:  [58.06 kg (128 lb)] 58.06 kg (128 lb) (05/30 1115)A  Intake/Output from previous day: 05/30 0701 - 05/31 0700 In: 5268.7 [P.O.:690; I.V.:3378.7] Out: 1495 [Urine:1075; Emesis/NG output:300; Blood:120] Intake/Output this shift: Total I/O In: -  Out: 200 [Urine:200]  Past Medical History  Diagnosis Date  . Sleep apnea     CPAP-non compliant  . Headache   . Anemia   . Depression   . Arthritis     chronic low back pain    Physical Exam:  General:wdwnwf in nad. Lungs - Normal respiratory effort, chest expands symmetrically.  Abdomen - Soft, non-tender & non-distended.  Lab Results: No results found for this basename: WBC:3,HGB:3,HCT:3 in the last 72 hours BMET No results found for this basename: NA:2,K:2,CL:2,CO2:2,GLUCOSE:2,BUN:2,CREATININE:2,CALCIUM:2 in the last 72 hours No results found for this basename: LABURIN:1 in the last 72 hours No results found for this or any previous visit.  Studies/Results: @RISRSLT24 @  Assessment/Plan: Stable / She has BMs only 3x/week, and has had no BM since Tuesday. She will take Senakot 2 tabs tonight, and begin Miralax 2 tbsp/day.  Anticipate D/c today, pot voiding trial.   Ajia Chadderdon I 03/25/2012, 8:37 AM

## 2012-03-25 NOTE — Progress Notes (Signed)
Packing was removed, scant amount of drainage noted. Pt tolerated well.

## 2012-03-26 ENCOUNTER — Encounter (HOSPITAL_COMMUNITY): Payer: Self-pay | Admitting: *Deleted

## 2012-03-26 MED ORDER — KETOROLAC TROMETHAMINE 30 MG/ML IJ SOLN
30.0000 mg | Freq: Once | INTRAMUSCULAR | Status: AC
  Administered 2012-03-26: 30 mg via INTRAMUSCULAR
  Filled 2012-03-26: qty 1

## 2012-03-26 NOTE — MAU Note (Signed)
Dr Mena Goes returned call and aware foley placed, drained total 1000cc amber urine, pt more comfortable. Ok for pt to go home and follow up in office. Aware provider must see pt before d/c. RN to call Dr Juliene Pina to see pt since ob/gyn directed pt to come to [facility].

## 2012-03-26 NOTE — Discharge Instructions (Signed)
Foley Catheter Care, Adult A soft, flexible tube (Foley catheter) has been placed in your bladder. This may be done to temporarily help with urine drainage after an operation or to relieve blockage from an enlarged prostate gland. HOME CARE INSTRUCTIONS  If you are going home with a Foley catheter in place, follow these instructions: Taking Care of the Catheter:  Keep the area where the catheter leaves your body clean.   Attach the catheter to the leg so there is no tension on the catheter.   Keep the drainage bag below the level of the bladder, but keep it OFF the floor.   Do not take long soaking baths. Your caregiver will give instructions about showering.   Wash your hands before touching ANYTHING related to the catheter or bag.   Using mild soap and warm water on a washcloth:   Clean the area closest to the catheter insertion site using a circular motion around the catheter.   Clean the catheter itself by wiping AWAY from the insertion site for several inches down the tube.   NEVER wipe upward as this could sweep bacteria up into the urethra (tube in your body that normally drains the bladder) and cause infection.  Taking Care of the Drainage Bags:  Two drainage bags will be taken home: a large overnight drainage bag, and a smaller leg bag which fits underneath clothing.   It is okay to wear the overnight bag at any time, but NEVER wear the smaller leg bag at night.   Keep the drainage bag well below the level of your bladder. This prevents backflow of urine into the bladder and allows the urine to drain freely.   Anchor the tubing to your leg to prevent pulling or tension on the catheter. Use tape or a leg strap provided by the hospital.   Empty the drainage bag when it is  to  full. Wash your hands before and after touching the bag.   Periodically check the tubing for kinks to make sure there is no pressure on the tubing which could restrict the flow of urine.  Changing  the Drainage Bags:  Cleanse both ends of the clean bag with alcohol before changing.   Pinch off the rubber catheter to avoid urine spillage during the disconnection.   Disconnect the dirty bag and connect the clean one.   Empty the dirty bag carefully to avoid a urine spill.   Attach the new bag to the leg with tape or a leg strap.  Cleaning the Drainage Bags:  Whenever a drainage bag is disconnected, it must be cleaned quickly so it is ready for the next use.   Wash the bag in warm, soapy water.   Rinse the bag thoroughly with warm water.   Soak the bag for 30 minutes in a solution of white vinegar and water (1 cup vinegar to 1 quart warm water).   Rinse with warm water.  SEEK MEDICAL CARE IF:   Some pain develops in the kidney (lower back) area.   The urine is cloudy or smells bad.   There is some blood in the urine.   The catheter becomes clogged and/or there is no urine drainage.  SEEK IMMEDIATE MEDICAL CARE IF:   You have moderate or severe pain in the kidney region.   You start to throw up (vomit).   Blood fills the tube.   Worsening belly (abdominal) pain develops.   You have a fever.  MAKE SURE YOU:  You have a fever.  MAKE SURE YOU:    Understand these instructions.   Will watch your condition.   Will get help right away if you are not doing well or get worse.  Document Released: 10/12/2005 Document Revised: 10/01/2011 Document Reviewed: 04/08/2007  ExitCare Patient Information 2012 [employer].

## 2012-03-26 NOTE — MAU Note (Signed)
Dr. Eskridge paged.  

## 2012-03-26 NOTE — MAU Note (Signed)
Dr Juliene Pina called and will come see pt

## 2012-03-26 NOTE — MAU Note (Signed)
450cc amber urine drained after foley cath placed to drainage bag. Pt felt much better

## 2012-03-26 NOTE — MAU Provider Note (Signed)
  History     CSN: [id number]  Arrival date and time: 03/25/12 2354   First Provider Initiated Contact with Patient 03/26/12 [phone]      Chief Complaint  Patient presents with  . Post-op Problem   HPI Acute urinary retention following bladder Sling yesterday, was d/c home this morning and has not voided since.  No fever/chills. Some pelvic pain/ pulling sensation. No hematuria.  Surgery reviewed.    Past Medical History  Diagnosis Date  . Sleep apnea     CPAP-non compliant  . Headache   . Anemia   . Depression   . Arthritis     chronic low back pain    Past Surgical History  Procedure Date  . Foot surgery 2006    left  . Svd     x 3  . Colonoscopy 1999  . Tonsillectomy as child  . Pubovaginal sling 03/24/2012    Procedure: Leonides Grills;  Surgeon: Kathi Ludwig, MD;  Location: [facility];  Service: Urology;  Laterality: N/A;  lynx with Environmental manager   . Dilation and curettage of uterus   . Ureteral exploration 5./30/13    Family History  Problem Relation Age of Onset  . Anesthesia problems Neg Hx     History  Substance Use Topics  . Smoking status: Never Smoker   . Smokeless tobacco: Not on file  . Alcohol Use: No    Allergies:  Allergies  Allergen Reactions  . Codeine Nausea And Vomiting  . Penicillins Other (See Comments)    Unknown- childhood reaction    Prescriptions prior to admission  Medication Sig Dispense Refill  . buPROPion (WELLBUTRIN XL) 150 MG 24 hr tablet Take 450 mg by mouth every morning.      . hydrocodone-acetaminophen (LORCET PLUS) 7.5-650 MG per tablet Take 1 tablet by mouth every 6 (six) hours as needed for pain.  30 tablet  0  . loratadine (CLARITIN) 10 MG tablet Take 10 mg by mouth every morning.      . trimethoprim (TRIMPEX) 100 MG tablet Take 1 tablet (100 mg total) by mouth 1 day or 1 dose.  30 tablet  1  . amphetamine-dextroamphetamine (ADDERALL XR) 25 MG 24 hr capsule Take 25 mg by mouth 2 (two) times daily.       . Cyanocobalamin (VITAMIN B-12 PO) Take 1 tablet by mouth every morning.      . Multiple Vitamin (MULITIVITAMIN WITH MINERALS) TABS Take 1 tablet by mouth every morning.        ROS Physical Exam   Blood pressure 111/77, pulse 83, temperature 98.8 F (37.1 C), temperature source Oral, resp. rate 20, height 5\' 3"  (1.6 m), weight 58.06 kg (128 lb), last menstrual period 03/07/2012.  Physical Exam A&O x 3, no acute distress but in discomfort from pain.  Abdo soft, non tender, non acute, suprapubic incisions tender but no erythema.hematoma Extr no edema/ tenderness Pelvic deferred  MAU Course  Procedures Foley catheter placement. Large volume of retained urine drained, is clear   Assessment and Plan  Foley placed, explained cause and care.  F/up 3 days with Dr Patsi Sears or call Dr Cousin's office.  Warning s/s of infection and pain mngmt reviewed.    Gali Spinney R 03/26/2012, 2:48 AM

## 2012-03-26 NOTE — MAU Note (Signed)
Dr Juliene Pina notified of pt's admission and status. Aware Dr Mena Goes paged but has not returned call yet and pt very uncomfortable. Order to place foley #14

## 2012-03-26 NOTE — MAU Note (Signed)
Pt offered leg bag for foley but prefers to keep larger foley bag.

## 2012-03-26 NOTE — Progress Notes (Signed)
Written and verbal d/c instructions given and understanding voiced. Instructed husband in emptying foley bag and understanding voiced. Pt understands to wash around foley with soap and water daily. Will call office Monday am for follow up with urologist.

## 2012-03-26 NOTE — MAU Note (Signed)
Foley has drained total 1000cc amber urine.

## 2012-03-26 NOTE — MAU Note (Signed)
Had D&C, hysteroscopy, urethral sling done yesterday. Went home this am. Last voided at 0930 and now unable to void.

## 2012-05-22 ENCOUNTER — Emergency Department (HOSPITAL_COMMUNITY)
Admission: EM | Admit: 2012-05-22 | Discharge: 2012-05-23 | Disposition: A | Discharge status: Home / Self Care | Payer: BC Managed Care – PPO | Attending: Emergency Medicine | Admitting: Emergency Medicine

## 2012-05-22 ENCOUNTER — Encounter (HOSPITAL_COMMUNITY): Payer: Self-pay | Admitting: Emergency Medicine

## 2012-05-22 DIAGNOSIS — M47817 Spondylosis without myelopathy or radiculopathy, lumbosacral region: Secondary | ICD-10-CM | POA: Insufficient documentation

## 2012-05-22 DIAGNOSIS — Z880 Allergy status to penicillin: Secondary | ICD-10-CM | POA: Insufficient documentation

## 2012-05-22 DIAGNOSIS — Z886 Allergy status to analgesic agent status: Secondary | ICD-10-CM | POA: Insufficient documentation

## 2012-05-22 DIAGNOSIS — Z91199 Patient's noncompliance with other medical treatment and regimen due to unspecified reason: Secondary | ICD-10-CM | POA: Insufficient documentation

## 2012-05-22 DIAGNOSIS — R339 Retention of urine, unspecified: Secondary | ICD-10-CM | POA: Insufficient documentation

## 2012-05-22 DIAGNOSIS — Z9119 Patient's noncompliance with other medical treatment and regimen: Secondary | ICD-10-CM | POA: Insufficient documentation

## 2012-05-22 DIAGNOSIS — G4730 Sleep apnea, unspecified: Secondary | ICD-10-CM | POA: Insufficient documentation

## 2012-05-22 NOTE — ED Notes (Signed)
Pt alert, arrives from home, c/o urinary retention, recent "sling placed on bladder", May 2013, resp even unlabored, skin pwd

## 2012-05-23 LAB — URINALYSIS, ROUTINE W REFLEX MICROSCOPIC
Glucose, UA: NEGATIVE mg/dL
Hgb urine dipstick: NEGATIVE
Specific Gravity, Urine: 1.008 (ref 1.005–1.030)
Urobilinogen, UA: 0.2 mg/dL (ref 0.0–1.0)
pH: 6.5 (ref 5.0–8.0)

## 2012-05-23 NOTE — ED Provider Notes (Signed)
History     CSN: [id number]  Arrival date & time 05/22/12  2328   First MD Initiated Contact with Patient 05/23/12 0026      Chief Complaint  Patient presents with  . Urinary Retention     HPI Pt has not been able to urinate well today.  The last time she felt like she could urinate completely was 3 pm.  Pt had bladder surgery May 30th but has been fine since except for one episode of urinary retention right after the surgery.  She has some back discomfort.  No vomiting fever or diarrhea.  She has seen Dr. Patsi Sears in the urology office. She mentions that she was told she has a neurogenic bladder Past Medical History  Diagnosis Date  . Sleep apnea     CPAP-non compliant  . Headache   . Anemia   . Depression   . Arthritis     chronic low back pain    Past Surgical History  Procedure Date  . Foot surgery 2006    left  . Svd     x 3  . Colonoscopy 1999  . Tonsillectomy as child  . Pubovaginal sling 03/24/2012    Procedure: Leonides Grills;  Surgeon: Kathi Ludwig, MD;  Location: [facility];  Service: Urology;  Laterality: N/A;  lynx with Environmental manager   . Dilation and curettage of uterus   . Ureteral exploration 5./30/13    Family History  Problem Relation Age of Onset  . Anesthesia problems Neg Hx     History  Substance Use Topics  . Smoking status: Never Smoker   . Smokeless tobacco: Not on file  . Alcohol Use: No    OB History    Grav Para Term Preterm Abortions TAB SAB Ect Mult Living   4 3 3  0 1 0 1 0 0 3      Review of Systems  All other systems reviewed and are negative.    Allergies  Codeine and Penicillins  Home Medications   Current Outpatient Rx  Name Route Sig Dispense Refill  . AMPHETAMINE-DEXTROAMPHET ER 25 MG PO CP24 Oral Take 25 mg by mouth 2 (two) times daily.    . BUPROPION HCL ER (XL) 150 MG PO TB24 Oral Take 450 mg by mouth every morning.    Marland Kitchen VITAMIN B-12 PO Oral Take 1 tablet by mouth every morning.    Marland Kitchen  LORATADINE 10 MG PO TABS Oral Take 10 mg by mouth every morning.    . ADULT MULTIVITAMIN W/MINERALS CH Oral Take 1 tablet by mouth every morning.      BP 112/85  Pulse 95  Temp 98 F (36.7 C)  Resp 16  SpO2 99%  LMP 04/24/2012  Physical Exam  Nursing note and vitals reviewed. Constitutional: She appears well-developed and well-nourished. No distress.  HENT:  Head: Normocephalic and atraumatic.  Right Ear: External ear normal.  Left Ear: External ear normal.  Eyes: Conjunctivae are normal. Right eye exhibits no discharge. Left eye exhibits no discharge. No scleral icterus.  Neck: Neck supple. No tracheal deviation present.  Cardiovascular: Normal rate and normal heart sounds.   Pulmonary/Chest: Effort normal and breath sounds normal. No stridor. No respiratory distress.  Abdominal: Soft. Bowel sounds are normal. There is tenderness. There is no rebound and no guarding (mild tenderness suprapubic area).  Musculoskeletal: She exhibits no edema.  Neurological: She is alert. Cranial nerve deficit: no gross deficits.  Skin: Skin is warm and dry. No  rash noted.  Psychiatric: She has a normal mood and affect.    ED Course  Procedures (including critical care time)   Labs Reviewed  URINALYSIS, ROUTINE W REFLEX MICROSCOPIC  URINE CULTURE   No results found.  1. Urinary retention     MDM  Pt urinated and still had >500 cc urine on the bladder scan.  Will place a foley catheter and have her follow up with her urologist.        Celene Kras, MD 05/23/12 [phone]

## 2012-05-23 NOTE — ED Notes (Signed)
Patient given discharge instructions, information, prescriptions, and diet order. Patient states that they adequately understand discharge information given and to return to ED if symptoms return or worsen.    Pt informed to follow up with MD Patsi Sears

## 2012-05-23 NOTE — ED Notes (Signed)
Patient sts she had a bladder sling placed on may 30th. Sts she has not been able to fully empty her bladder all day today. Patient sts she feels very full at this time and a lot of pressure. sts she can urinate, but her bladder doesn't fully empty. Patient in NAD.

## 2012-05-24 LAB — URINE CULTURE
Culture: NO GROWTH
Special Requests: NORMAL

## 2013-10-01 ENCOUNTER — Emergency Department (HOSPITAL_COMMUNITY): Payer: 59

## 2013-10-01 ENCOUNTER — Emergency Department (HOSPITAL_COMMUNITY)
Admission: EM | Admit: 2013-10-01 | Discharge: 2013-10-01 | Disposition: A | Discharge status: Home / Self Care | Payer: 59 | Attending: Emergency Medicine | Admitting: Emergency Medicine

## 2013-10-01 ENCOUNTER — Encounter (HOSPITAL_COMMUNITY): Payer: Self-pay | Admitting: Emergency Medicine

## 2013-10-01 DIAGNOSIS — N949 Unspecified condition associated with female genital organs and menstrual cycle: Secondary | ICD-10-CM | POA: Insufficient documentation

## 2013-10-01 DIAGNOSIS — R109 Unspecified abdominal pain: Secondary | ICD-10-CM

## 2013-10-01 DIAGNOSIS — Z880 Allergy status to penicillin: Secondary | ICD-10-CM | POA: Insufficient documentation

## 2013-10-01 DIAGNOSIS — F329 Major depressive disorder, single episode, unspecified: Secondary | ICD-10-CM | POA: Insufficient documentation

## 2013-10-01 DIAGNOSIS — M79609 Pain in unspecified limb: Secondary | ICD-10-CM | POA: Insufficient documentation

## 2013-10-01 DIAGNOSIS — N938 Other specified abnormal uterine and vaginal bleeding: Secondary | ICD-10-CM

## 2013-10-01 DIAGNOSIS — N898 Other specified noninflammatory disorders of vagina: Secondary | ICD-10-CM | POA: Insufficient documentation

## 2013-10-01 DIAGNOSIS — Z79899 Other long term (current) drug therapy: Secondary | ICD-10-CM | POA: Insufficient documentation

## 2013-10-01 DIAGNOSIS — Z862 Personal history of diseases of the blood and blood-forming organs and certain disorders involving the immune mechanism: Secondary | ICD-10-CM | POA: Insufficient documentation

## 2013-10-01 DIAGNOSIS — M129 Arthropathy, unspecified: Secondary | ICD-10-CM | POA: Insufficient documentation

## 2013-10-01 DIAGNOSIS — F3289 Other specified depressive episodes: Secondary | ICD-10-CM | POA: Insufficient documentation

## 2013-10-01 LAB — CBC WITH DIFFERENTIAL/PLATELET
Eosinophils Absolute: 0.1 10*3/uL (ref 0.0–0.7)
Eosinophils Relative: 2 % (ref 0–5)
Hemoglobin: 12.5 g/dL (ref 12.0–15.0)
Lymphs Abs: 1.9 10*3/uL (ref 0.7–4.0)
MCH: 26.8 pg (ref 26.0–34.0)
MCV: 79.4 fL (ref 78.0–100.0)
Monocytes Absolute: 0.5 10*3/uL (ref 0.1–1.0)
Monocytes Relative: 9 % (ref 3–12)
Platelets: 303 10*3/uL (ref 150–400)
RBC: 4.67 MIL/uL (ref 3.87–5.11)

## 2013-10-01 LAB — BASIC METABOLIC PANEL
BUN: 9 mg/dL (ref 6–23)
Calcium: 9 mg/dL (ref 8.4–10.5)
Creatinine, Ser: 1.06 mg/dL (ref 0.50–1.10)
GFR calc non Af Amer: 64 mL/min — ABNORMAL LOW (ref >90)
Glucose, Bld: 74 mg/dL (ref 70–99)

## 2013-10-01 LAB — URINALYSIS, ROUTINE W REFLEX MICROSCOPIC
Bilirubin Urine: NEGATIVE
Specific Gravity, Urine: 1.015 (ref 1.005–1.030)
Urobilinogen, UA: 1 mg/dL (ref 0.0–1.0)

## 2013-10-01 MED ORDER — MEGESTROL ACETATE 40 MG PO TABS: 40.0000 mg | ORAL_TABLET | Freq: Every day | ORAL | Status: DC

## 2013-10-01 MED ORDER — MORPHINE SULFATE 4 MG/ML IJ SOLN
4.0000 mg | Freq: Once | INTRAMUSCULAR | Status: AC
  Administered 2013-10-01: 4 mg via INTRAVENOUS
  Filled 2013-10-01: qty 1

## 2013-10-01 MED ORDER — ONDANSETRON 4 MG PO TBDP: 4.0000 mg | ORAL_TABLET | Freq: Three times a day (TID) | ORAL | Status: DC | PRN

## 2013-10-01 MED ORDER — ONDANSETRON HCL 4 MG/2ML IJ SOLN
4.0000 mg | Freq: Once | INTRAMUSCULAR | Status: AC
  Administered 2013-10-01: 4 mg via INTRAVENOUS
  Filled 2013-10-01: qty 2

## 2013-10-01 MED ORDER — MEGESTROL ACETATE 40 MG PO TABS
160.0000 mg | ORAL_TABLET | Freq: Every day | ORAL | Status: DC
  Administered 2013-10-01: 160 mg via ORAL
  Filled 2013-10-01: qty 4

## 2013-10-01 MED ORDER — DIAZEPAM 5 MG/ML IJ SOLN
2.5000 mg | Freq: Once | INTRAMUSCULAR | Status: AC
  Administered 2013-10-01: 2.5 mg via INTRAVENOUS
  Filled 2013-10-01: qty 2

## 2013-10-01 MED ORDER — OXYCODONE-ACETAMINOPHEN 5-325 MG PO TABS: 2.0000 | ORAL_TABLET | ORAL | Status: DC | PRN

## 2013-10-01 NOTE — ED Provider Notes (Signed)
CSN: [id number]     Arrival date & time 10/01/13  1042 History   First MD Initiated Contact with Patient 10/01/13 1124     Chief Complaint  Patient presents with  . Vaginal Bleeding  . Leg Pain   (Consider location/radiation/quality/duration/timing/severity/associated sxs/prior Treatment) HPI Comments: Patient is a 42 year old female with a past medical history of anemia, arthritis and sleep apnea who presents with heavy vaginal bleeding since yesterday. Symptoms started gradually and progressively worsened since the onset. Patient reports this is her second time menstruating this month, which has happened frequently for the past few months. She reports generalized lower abdominal cramping and passing clots. She reports having a history of "bladder issues" for which she gets pelvic physical therapy at [facility]. No aggravating/alleviating factors. No other associated symptoms.    Past Medical History  Diagnosis Date  . Sleep apnea     CPAP-non compliant  . Headache(784.0)   . Anemia   . Depression   . Arthritis     chronic low back pain   Past Surgical History  Procedure Laterality Date  . Foot surgery  2006    left  . Svd      x 3  . Colonoscopy  1999  . Tonsillectomy  as child  . Pubovaginal sling  03/24/2012    Procedure: Leonides Grills;  Surgeon: Kathi Ludwig, MD;  Location: [facility];  Service: Urology;  Laterality: N/A;  lynx with Environmental manager   . Dilation and curettage of uterus    . Ureteral exploration  5./30/13   Family History  Problem Relation Age of Onset  . Anesthesia problems Neg Hx    History  Substance Use Topics  . Smoking status: Never Smoker   . Smokeless tobacco: Not on file  . Alcohol Use: No   OB History   Grav Para Term Preterm Abortions TAB SAB Ect Mult Living   4 3 3  0 1 0 1 0 0 3     Review of Systems  Constitutional: Negative for fever, chills and fatigue.  HENT: Negative for trouble swallowing.   Eyes: Negative for visual  disturbance.  Respiratory: Negative for shortness of breath.   Cardiovascular: Negative for chest pain and palpitations.  Gastrointestinal: Negative for nausea, vomiting, abdominal pain and diarrhea.  Genitourinary: Positive for vaginal discharge and pelvic pain. Negative for dysuria and difficulty urinating.  Musculoskeletal: Negative for arthralgias and neck pain.  Skin: Negative for color change.  Neurological: Negative for dizziness and weakness.  Psychiatric/Behavioral: Negative for dysphoric mood.    Allergies  Codeine and Penicillins  Home Medications   Current Outpatient Rx  Name  Route  Sig  Dispense  Refill  . amphetamine-dextroamphetamine (ADDERALL XR) 25 MG 24 hr capsule   Oral   Take 25 mg by mouth 2 (two) times daily.         Marland Kitchen buPROPion (WELLBUTRIN XL) 150 MG 24 hr tablet   Oral   Take 450 mg by mouth every morning.         . cyclobenzaprine (FLEXERIL) 10 MG tablet   Oral   Take 10 mg by mouth 3 (three) times daily as needed for muscle spasms.         Marland Kitchen gabapentin (NEURONTIN) 100 MG capsule   Oral   Take 100 mg by mouth 2 (two) times daily.         Marland Kitchen lidocaine (LIDODERM) 5 %   Transdermal   Place 1 patch onto the skin  daily. Remove & Discard patch within 12 hours or as directed by MD          BP 110/74  Pulse 65  Temp(Src) 98.7 F (37.1 C) (Oral)  Resp 12  SpO2 100% Physical Exam  Nursing note and vitals reviewed. Constitutional: She is oriented to person, place, and time. She appears well-developed and well-nourished. No distress.  HENT:  Head: Normocephalic and atraumatic.  Eyes: Conjunctivae and EOM are normal.  Neck: Normal range of motion.  Cardiovascular: Normal rate and regular rhythm.  Exam reveals no gallop and no friction rub.   No murmur heard. Pulmonary/Chest: Effort normal and breath sounds normal. She has no wheezes. She has no rales. She exhibits no tenderness.  Abdominal: Soft. She exhibits no distension. There is  tenderness. There is no rebound and no guarding.  Generalized lower abdominal tenderness to palpation. No focal tenderness or peritoneal signs.   Genitourinary: Vagina normal.  Copious blood in vagina. Cervical os closed. Patient is not tolerable of speculum exam, as she is in pain. Tenderness to palpation on bimanual exam.   Musculoskeletal: Normal range of motion.  Neurological: She is alert and oriented to person, place, and time. Coordination normal.  Speech is goal-oriented. Moves limbs without ataxia.   Skin: Skin is warm and dry.  Psychiatric: She has a normal mood and affect. Her behavior is normal.    ED Course  Procedures (including critical care time) Labs Review Labs Reviewed  URINALYSIS, ROUTINE W REFLEX MICROSCOPIC - Abnormal; Notable for the following:    Hgb urine dipstick SMALL (*)    All other components within normal limits  BASIC METABOLIC PANEL - Abnormal; Notable for the following:    GFR calc non Af Amer 64 (*)    GFR calc Af Amer 75 (*)    All other components within normal limits  CBC WITH DIFFERENTIAL  URINE MICROSCOPIC-ADD ON   Imaging Review US Transvaginal Non-ob  10/01/2013   CLINICAL DATA:  Dysfunctional uterine bleeding.  EXAM: TRANSABDOMINAL AND TRANSVAGINAL ULTRASOUND OF PELVIS  TECHNIQUE: Both transabdominal and transvaginal ultrasound examinations of the pelvis were performed. Transabdominal technique was performed for global imaging of the pelvis including uterus, ovaries, adnexal regions, and pelvic cul-de-sac. It was necessary to proceed with endovaginal exam following the transabdominal exam to visualize the uterus, ovaries and endometrium.  COMPARISON:  None  FINDINGS: Uterus  Measurements: 8.1 x 4.9 x 4.7 cm. Heterogeneous without a distinctly visible mass. Retroverted.  Endometrium  Thickness: 6.4 mm.  No focal abnormality visualized.  Right ovary  Measurements: 3.1 x 2.0 x 1.9 cm. Normal appearance/no adnexal mass.  Left ovary  Measurements: 2.9  x 1.8 x 1.7 cm. Normal appearance/no adnexal mass.  Other findings  Trace amount of free peritoneal fluid, within normal limits of physiological fluid.  IMPRESSION: Heterogeneous uterus without a discrete mass and without significant enlargement. This could represent early changes of adenomyosis. Otherwise, normal examination.   Electronically Signed   By: Gordan Payment M.D.   On: 10/01/2013 14:17   US Pelvis Complete  10/01/2013   CLINICAL DATA:  Dysfunctional uterine bleeding.  EXAM: TRANSABDOMINAL AND TRANSVAGINAL ULTRASOUND OF PELVIS  TECHNIQUE: Both transabdominal and transvaginal ultrasound examinations of the pelvis were performed. Transabdominal technique was performed for global imaging of the pelvis including uterus, ovaries, adnexal regions, and pelvic cul-de-sac. It was necessary to proceed with endovaginal exam following the transabdominal exam to visualize the uterus, ovaries and endometrium.  COMPARISON:  None  FINDINGS: Uterus  Measurements: 8.1 x 4.9 x 4.7 cm. Heterogeneous without a distinctly visible mass. Retroverted.  Endometrium  Thickness: 6.4 mm.  No focal abnormality visualized.  Right ovary  Measurements: 3.1 x 2.0 x 1.9 cm. Normal appearance/no adnexal mass.  Left ovary  Measurements: 2.9 x 1.8 x 1.7 cm. Normal appearance/no adnexal mass.  Other findings  Trace amount of free peritoneal fluid, within normal limits of physiological fluid.  IMPRESSION: Heterogeneous uterus without a discrete mass and without significant enlargement. This could represent early changes of adenomyosis. Otherwise, normal examination.   Electronically Signed   By: Gordan Payment M.D.   On: 10/01/2013 14:17    EKG Interpretation   None       MDM   1. Dysfunctional uterine bleeding   2. Abdominal pain     2:58 PM Labs and urinalysis unremarkable for acute changes. Vitals stable and patient afebrile. Pelvic US shows no mass or enlargement of uterus. Patient will have Megace here and be discharged with  prescriptions for megace and pain medication. Patient instructed to follow up with her obgyn.     Emilia Beck, PA-C 10/03/13 [phone]

## 2013-10-01 NOTE — ED Notes (Signed)
Pt has heavy bleeding clots since yesterday. Has been seen at [facility] for bladder issues since her children. Was treated for a uti last month. Pt also has rt leg pain and is being treated at [facility] for physical therapy for this. Denies any urianry sx, no n/v/d no dizziness.

## 2013-10-04 NOTE — ED Provider Notes (Signed)
Medical screening examination/treatment/procedure(s) were performed by non-physician practitioner and as supervising physician I was immediately available for consultation/collaboration.  EKG Interpretation   None         Siara Gorder, MD 10/04/13 0707 

## 2013-11-17 ENCOUNTER — Inpatient Hospital Stay (HOSPITAL_COMMUNITY)
Admission: EM | Admit: 2013-11-17 | Discharge: 2013-11-21 | DRG: 989 | Disposition: A | Discharge status: Other Acute Inpatient Hospital | Payer: 59 | Attending: Obstetrics and Gynecology | Admitting: Obstetrics and Gynecology

## 2013-11-17 ENCOUNTER — Encounter (HOSPITAL_COMMUNITY): Payer: Self-pay | Admitting: Emergency Medicine

## 2013-11-17 ENCOUNTER — Emergency Department (HOSPITAL_COMMUNITY): Payer: 59

## 2013-11-17 DIAGNOSIS — IMO0002 Reserved for concepts with insufficient information to code with codable children: Principal | ICD-10-CM | POA: Diagnosis present

## 2013-11-17 DIAGNOSIS — N739 Female pelvic inflammatory disease, unspecified: Secondary | ICD-10-CM

## 2013-11-17 DIAGNOSIS — K5900 Constipation, unspecified: Secondary | ICD-10-CM | POA: Diagnosis present

## 2013-11-17 DIAGNOSIS — D649 Anemia, unspecified: Secondary | ICD-10-CM | POA: Diagnosis present

## 2013-11-17 DIAGNOSIS — N731 Chronic parametritis and pelvic cellulitis: Secondary | ICD-10-CM | POA: Diagnosis present

## 2013-11-17 DIAGNOSIS — G4730 Sleep apnea, unspecified: Secondary | ICD-10-CM | POA: Diagnosis present

## 2013-11-17 DIAGNOSIS — Y836 Removal of other organ (partial) (total) as the cause of abnormal reaction of the patient, or of later complication, without mention of misadventure at the time of the procedure: Secondary | ICD-10-CM | POA: Diagnosis present

## 2013-11-17 DIAGNOSIS — Z90710 Acquired absence of both cervix and uterus: Secondary | ICD-10-CM

## 2013-11-17 DIAGNOSIS — R102 Pelvic and perineal pain: Secondary | ICD-10-CM | POA: Diagnosis present

## 2013-11-17 HISTORY — DX: Anxiety disorder, unspecified: F41.9

## 2013-11-17 HISTORY — DX: Malignant (primary) neoplasm, unspecified: C80.1

## 2013-11-17 LAB — CBC WITH DIFFERENTIAL/PLATELET
BASOS ABS: 0 10*3/uL (ref 0.0–0.1)
Basophils Relative: 0 % (ref 0–1)
EOS PCT: 0 % (ref 0–5)
Eosinophils Absolute: 0.1 10*3/uL (ref 0.0–0.7)
HEMATOCRIT: 26.8 % — AB (ref 36.0–46.0)
HEMOGLOBIN: 8.8 g/dL — AB (ref 12.0–15.0)
Lymphocytes Relative: 7 % — ABNORMAL LOW (ref 12–46)
Lymphs Abs: 1.1 10*3/uL (ref 0.7–4.0)
MCH: 25.9 pg — ABNORMAL LOW (ref 26.0–34.0)
MCHC: 32.8 g/dL (ref 30.0–36.0)
MCV: 78.8 fL (ref 78.0–100.0)
MONO ABS: 0.9 10*3/uL (ref 0.1–1.0)
MONOS PCT: 6 % (ref 3–12)
NEUTROS ABS: 13.7 10*3/uL — AB (ref 1.7–7.7)
Neutrophils Relative %: 87 % — ABNORMAL HIGH (ref 43–77)
Platelets: 463 10*3/uL — ABNORMAL HIGH (ref 150–400)
RBC: 3.4 MIL/uL — ABNORMAL LOW (ref 3.87–5.11)
RDW: 15.1 % (ref 11.5–15.5)
WBC: 15.8 10*3/uL — AB (ref 4.0–10.5)

## 2013-11-17 LAB — URINALYSIS, ROUTINE W REFLEX MICROSCOPIC
Bilirubin Urine: NEGATIVE
Glucose, UA: NEGATIVE mg/dL
Hgb urine dipstick: NEGATIVE
KETONES UR: NEGATIVE mg/dL
LEUKOCYTES UA: NEGATIVE
Nitrite: NEGATIVE
PROTEIN: NEGATIVE mg/dL
Specific Gravity, Urine: 1.009 (ref 1.005–1.030)
Urobilinogen, UA: 0.2 mg/dL (ref 0.0–1.0)
pH: 6 (ref 5.0–8.0)

## 2013-11-17 LAB — COMPREHENSIVE METABOLIC PANEL
ALT: 35 U/L (ref 0–35)
AST: 39 U/L — AB (ref 0–37)
Albumin: 3.1 g/dL — ABNORMAL LOW (ref 3.5–5.2)
Alkaline Phosphatase: 110 U/L (ref 39–117)
BUN: 12 mg/dL (ref 6–23)
CALCIUM: 8.3 mg/dL — AB (ref 8.4–10.5)
CHLORIDE: 96 meq/L (ref 96–112)
CO2: 25 meq/L (ref 19–32)
CREATININE: 1.33 mg/dL — AB (ref 0.50–1.10)
GFR calc Af Amer: 57 mL/min — ABNORMAL LOW (ref >90)
GFR, EST NON AFRICAN AMERICAN: 49 mL/min — AB (ref >90)
Glucose, Bld: 103 mg/dL — ABNORMAL HIGH (ref 70–99)
Potassium: 3.7 mEq/L (ref 3.7–5.3)
Sodium: 134 mEq/L — ABNORMAL LOW (ref 137–147)
Total Bilirubin: 0.3 mg/dL (ref 0.3–1.2)
Total Protein: 7.1 g/dL (ref 6.0–8.3)

## 2013-11-17 LAB — POCT PREGNANCY, URINE: Preg Test, Ur: NEGATIVE

## 2013-11-17 LAB — POCT I-STAT CREATININE: Creatinine, Ser: 1.4 mg/dL — ABNORMAL HIGH (ref 0.50–1.10)

## 2013-11-17 LAB — LIPASE, BLOOD: LIPASE: 13 U/L (ref 11–59)

## 2013-11-17 MED ORDER — MORPHINE SULFATE 4 MG/ML IJ SOLN
4.0000 mg | Freq: Once | INTRAMUSCULAR | Status: AC
  Administered 2013-11-17: 4 mg via INTRAVENOUS
  Filled 2013-11-17: qty 1

## 2013-11-17 MED ORDER — SODIUM CHLORIDE 0.9 % IV BOLUS (SEPSIS)
1000.0000 mL | Freq: Once | INTRAVENOUS | Status: AC
  Administered 2013-11-17: 1000 mL via INTRAVENOUS

## 2013-11-17 MED ORDER — IOHEXOL 300 MG/ML  SOLN
25.0000 mL | Freq: Once | INTRAMUSCULAR | Status: AC | PRN
  Administered 2013-11-17: 25 mL via ORAL

## 2013-11-17 MED ORDER — MORPHINE SULFATE 4 MG/ML IJ SOLN
4.0000 mg | Freq: Once | INTRAMUSCULAR | Status: DC
  Filled 2013-11-17: qty 1

## 2013-11-17 MED ORDER — IOHEXOL 300 MG/ML  SOLN
100.0000 mL | Freq: Once | INTRAMUSCULAR | Status: AC | PRN
  Administered 2013-11-17: 100 mL via INTRAVENOUS

## 2013-11-17 MED ORDER — ONDANSETRON HCL 4 MG/2ML IJ SOLN
4.0000 mg | Freq: Once | INTRAMUSCULAR | Status: AC
  Administered 2013-11-17: 4 mg via INTRAVENOUS
  Filled 2013-11-17: qty 2

## 2013-11-17 NOTE — ED Notes (Signed)
[facility] called for transport. 

## 2013-11-17 NOTE — ED Notes (Signed)
Pt reports persistent constipation and abdominal cramping since hysterectomy. The  surgery was at [facility] on 10/27/2013. Pt using laxatives and enema with minimal relief. Pt advised by surgeon to be seen in ED. Pt c/o bloating, chills, cramping, also, burning on urination

## 2013-11-17 NOTE — ED Provider Notes (Signed)
CSN: [id number]     Arrival date & time 11/17/13  1832 History   First MD Initiated Contact with Patient 11/17/13 1919     Chief Complaint  Patient presents with  . Constipation  . Abdominal Pain  . Abdominal Cramping    pt reports persistant constipation since surgery 10/27/2013  . Urinary Frequency   (Consider location/radiation/quality/duration/timing/severity/associated sxs/prior Treatment) HPI Comments: Patient is a 43 yo F PMHx significant for sleep apnea, depression, anemia, arthritis, anxiety, presenting to the ED for diffuse abdominal cramping with distention with associated constipation and nausea since having a hysterectomy on October 27, 2013 at [facility]. Patient states she has been on a daily regimen of Miralax, Senna, and two enemas with minimal stool passage. Patient states she has been able to pass gas. Last BM was on Monday after an enema. Patient denies any vomiting. Patient's abdominal surgical history includes recent hysterectomy, ureteral exploration, pubovaginal sling.   Patient is a 43 y.o. female presenting with constipation, abdominal pain, cramps, and frequency. The history is provided by the patient.  Constipation Associated symptoms: abdominal pain and nausea   Associated symptoms: no vomiting   Abdominal Pain Associated symptoms: constipation and nausea   Associated symptoms: no vomiting   Abdominal Cramping Associated symptoms include abdominal pain and nausea. Pertinent negatives include no vomiting.  Urinary Frequency Associated symptoms include abdominal pain and nausea. Pertinent negatives include no vomiting.    Past Medical History  Diagnosis Date  . Sleep apnea     CPAP-non compliant  . Headache(784.0)   . Anemia   . Depression   . Arthritis     chronic low back pain  . Anxiety    Past Surgical History  Procedure Laterality Date  . Foot surgery  2006    left  . Svd      x 3  . Colonoscopy  1999  . Tonsillectomy  as child  . Pubovaginal sling   03/24/2012    Procedure: Gaynelle Arabian;  Surgeon: Ailene Rud, MD;  Location: [facility];  Service: Urology;  Laterality: N/A;  lynx with Chemical engineer   . Dilation and curettage of uterus    . Ureteral exploration  5./30/13  . Abdominal hysterectomy    . Removal of urinary sling     Family History  Problem Relation Age of Onset  . Anesthesia problems Neg Hx    History  Substance Use Topics  . Smoking status: Never Smoker   . Smokeless tobacco: Not on file  . Alcohol Use: No   OB History   Grav Para Term Preterm Abortions TAB SAB Ect Mult Living   4 3 3  0 1 0 1 0 0 3     Review of Systems  Gastrointestinal: Positive for nausea, abdominal pain, constipation and abdominal distention. Negative for vomiting, blood in stool and anal bleeding.  Genitourinary: Positive for frequency.  All other systems reviewed and are negative.    Allergies  Codeine and Penicillins  Home Medications   Current Outpatient Rx  Name  Route  Sig  Dispense  Refill  . amphetamine-dextroamphetamine (ADDERALL XR) 25 MG 24 hr capsule   Oral   Take 25 mg by mouth 2 (two) times daily.         Marland Kitchen buPROPion (WELLBUTRIN XL) 150 MG 24 hr tablet   Oral   Take 450 mg by mouth every morning.         . cyclobenzaprine (FLEXERIL) 10 MG tablet   Oral  Take 10 mg by mouth 3 (three) times daily as needed for muscle spasms.         . Flaxseed, Linseed, (FLAX SEEDS) POWD   Oral   Take 1 scoop by mouth daily as needed (digestion).         . gabapentin (NEURONTIN) 100 MG capsule   Oral   Take 100 mg by mouth 2 (two) times daily.         Marland Kitchen ibuprofen (ADVIL,MOTRIN) 200 MG tablet   Oral   Take 600 mg by mouth every 6 (six) hours as needed for moderate pain.         Marland Kitchen lidocaine (LIDODERM) 5 %   Transdermal   Place 1 patch onto the skin daily. Remove & Discard patch within 12 hours or as directed by MD         . magnesium hydroxide (MILK OF MAGNESIA) 800 MG/5ML suspension    Oral   Take 30 mLs by mouth daily as needed for constipation.         . ondansetron (ZOFRAN ODT) 4 MG disintegrating tablet   Oral   Take 1 tablet (4 mg total) by mouth every 8 (eight) hours as needed for nausea or vomiting.   10 tablet   0   . polyethylene glycol (MIRALAX / GLYCOLAX) packet   Oral   Take 17 g by mouth daily.         Marland Kitchen senna (SENOKOT) 8.6 MG tablet   Oral   Take 1 tablet by mouth daily as needed for constipation.          BP 115/66  Pulse 116  Temp(Src) 99.8 F (37.7 C) (Oral)  Resp 18  SpO2 99%  LMP 04/24/2012 Physical Exam  Constitutional: She is oriented to person, place, and time. She appears well-developed and well-nourished. No distress.  HENT:  Head: Normocephalic and atraumatic.  Right Ear: External ear normal.  Left Ear: External ear normal.  Nose: Nose normal.  Mouth/Throat: No oropharyngeal exudate.  Eyes: Conjunctivae are normal.  Neck: Normal range of motion. Neck supple.  Cardiovascular: Normal rate, regular rhythm and normal heart sounds.   Pulmonary/Chest: Effort normal and breath sounds normal. No respiratory distress.  Abdominal: Soft. Bowel sounds are normal. She exhibits distension. She exhibits no mass. There is generalized tenderness. There is guarding. There is no rigidity, no rebound and no CVA tenderness.  Musculoskeletal: Normal range of motion. She exhibits no edema.  Neurological: She is alert and oriented to person, place, and time.  Skin: Skin is warm and dry. She is not diaphoretic.    ED Course  Procedures (including critical care time) Medications  morphine 4 MG/ML injection 4 mg (4 mg Intravenous Not Given 11/17/13 2201)  morphine 4 MG/ML injection 4 mg (4 mg Intravenous Given 11/17/13 2033)  ondansetron (ZOFRAN) injection 4 mg (4 mg Intravenous Given 11/17/13 2033)  iohexol (OMNIPAQUE) 300 MG/ML solution 25 mL (25 mLs Oral Contrast Given 11/17/13 2030)  iohexol (OMNIPAQUE) 300 MG/ML solution 100 mL (100 mLs  Intravenous Contrast Given 11/17/13 2123)  sodium chloride 0.9 % bolus 1,000 mL (0 mLs Intravenous Stopped 11/17/13 2352)  morphine 4 MG/ML injection 4 mg (4 mg Intravenous Given 11/17/13 2212)    Labs Review Labs Reviewed  CBC WITH DIFFERENTIAL - Abnormal; Notable for the following:    WBC 15.8 (*)    RBC 3.40 (*)    Hemoglobin 8.8 (*)    HCT 26.8 (*)    MCH 25.9 (*)  Platelets 463 (*)    Neutrophils Relative % 87 (*)    Neutro Abs 13.7 (*)    Lymphocytes Relative 7 (*)    All other components within normal limits  COMPREHENSIVE METABOLIC PANEL - Abnormal; Notable for the following:    Sodium 134 (*)    Glucose, Bld 103 (*)    Creatinine, Ser 1.33 (*)    Calcium 8.3 (*)    Albumin 3.1 (*)    AST 39 (*)    GFR calc non Af Amer 49 (*)    GFR calc Af Amer 57 (*)    All other components within normal limits  URINALYSIS, ROUTINE W REFLEX MICROSCOPIC - Abnormal; Notable for the following:    APPearance CLOUDY (*)    All other components within normal limits  POCT I-STAT CREATININE - Abnormal; Notable for the following:    Creatinine, Ser 1.40 (*)    All other components within normal limits  LIPASE, BLOOD  POCT PREGNANCY, URINE   Imaging Review Ct Abdomen Pelvis W Contrast  11/17/2013   CLINICAL DATA:  Lower pelvic pain.  Recent vaginal hysterectomy.  EXAM: CT ABDOMEN AND PELVIS WITH CONTRAST  TECHNIQUE: Multidetector CT imaging of the abdomen and pelvis was performed using the standard protocol following bolus administration of intravenous contrast.  CONTRAST:  135mL OMNIPAQUE IOHEXOL 300 MG/ML  SOLN  COMPARISON:  US PELVIS COMPLETE dated 10/01/2013; MR LUMBAR SPINE W/O CM dated 08/12/2012  FINDINGS: The visualized lung bases are clear.  The liver and spleen are unremarkable in appearance. The gallbladder is within normal limits. The pancreas and adrenal glands are unremarkable.  There is mild heterogeneity with respect to the enhancement of both kidneys, with a few apparent  wedge-shaped defects in enhancement, raising question for underlying mild bilateral pyelonephritis. No significant perinephric stranding is seen. There is no evidence of hydronephrosis. No renal or ureteral stones are identified. Prominence of the right ureter likely reflects the process within the pelvis.  The small bowel is unremarkable in appearance. The stomach is within normal limits. No acute vascular abnormalities are seen.  The appendix is normal in caliber and contains air, without evidence for appendicitis. The colon is partially filled with stool and is unremarkable in appearance, aside soft tissue inflammation at the level of the pelvis. Presacral stranding likely reflects the acute pelvic process.  There is a large mildly complex collection of peripherally enhancing fluid within the pelvis measuring 10.3 x 7.2 x 3.8 cm, compatible with a large abscess. Surrounding soft tissue inflammation involves the adjacent small and large bowel loops, and the overlying bladder. There is also a 7.3 x 4.5 x 4.8 cm apparent cyst at the right adnexa; this may be physiologic, but an underlying tubo-ovarian abscess cannot be entirely excluded. There is adjacent inflammation of the right ovary and right fallopian tube, which contains a small amount of fluid.  The bladder is mildly distended and anterior displaced, but grossly unremarkable. The patient is status post hysterectomy. There is mild inflammation about both ovaries, with the right-sided adnexal cystic lesion as described above. No inguinal lymphadenopathy is seen.  No acute osseous abnormalities are identified.  IMPRESSION: 1. Large mildly complex collection of peripherally enhancing fluid in the pelvis measuring 10.3 x 7.2 x 3.8 cm, compatible with a large abscess. Surrounding soft tissue inflammation involves adjacent small and large bowel loops, and the overlying bladder. 7.3 x 4.5 x 4.8 cm apparent cyst at the right adnexa may be physiologic, but underlying  tubo-ovarian abscess  cannot be entirely excluded. Adjacent inflammation of both ovaries and the right fallopian tube; the right fallopian tube contains a small amount of fluid. This could reflect pyosalpinx. 2. Mild heterogeneity with respect to enhancement of both kidneys, with a few apparent wedge-shaped defects in enhancement, raising question for underlying mild bilateral pyelonephritis. Prominence of the right ureter likely reflects the process within the pelvis.  These results were called by telephone at the time of interpretation on 11/17/2013 at 9:43 PM to Dr. Baron Sane, who verbally acknowledged these results.   Electronically Signed   By: Garald Balding M.D.   On: 11/17/2013 21:44    EKG Interpretation   None       MDM   1. Pelvic abscess in female   2. S/P hysterectomy     10:07 PM Discussed patient case with Dr. Mallory Shirk at [facility], he will accept patient as direct admit and provide IV antibiotics and treatment starting over there.    Filed Vitals:   11/17/13 2213  BP: 115/66  Pulse: 116  Temp: 99.8 F (37.7 C)  Resp: 18    Afebrile, NAD, non-toxic appearing, AAOx4. Abdomen diffusely tender with guarding and distention s/p hysterectomy. Bowel sounds present. IVF, pain medication, and nausea medications given with improvement symptoms. I have reviewed nursing notes, vital signs, and all appropriate lab and imaging results for this patient. CT scan concerning for pelvic abscess and questionable tubo-ovarian abscess. Discussed patient case with Dr. Glo Herring at [facility] who will accept patient as admission for further management and treatment. Patient is agreeable to plan for transfer. Patient d/w with Dr. Aline Brochure, agrees with plan.        Harlow Mares, PA-C 11/17/13 2359

## 2013-11-18 ENCOUNTER — Encounter (HOSPITAL_COMMUNITY): Payer: Self-pay | Admitting: Obstetrics and Gynecology

## 2013-11-18 LAB — BASIC METABOLIC PANEL
BUN: 8 mg/dL (ref 6–23)
CALCIUM: 8 mg/dL — AB (ref 8.4–10.5)
CO2: 27 mEq/L (ref 19–32)
Chloride: 103 mEq/L (ref 96–112)
Creatinine, Ser: 1.13 mg/dL — ABNORMAL HIGH (ref 0.50–1.10)
GFR, EST AFRICAN AMERICAN: 69 mL/min — AB (ref >90)
GFR, EST NON AFRICAN AMERICAN: 60 mL/min — AB (ref >90)
Glucose, Bld: 107 mg/dL — ABNORMAL HIGH (ref 70–99)
Potassium: 4.1 mEq/L (ref 3.7–5.3)
SODIUM: 140 meq/L (ref 137–147)

## 2013-11-18 LAB — CBC
HCT: 25.4 % — ABNORMAL LOW (ref 36.0–46.0)
Hemoglobin: 8.3 g/dL — ABNORMAL LOW (ref 12.0–15.0)
MCH: 25.6 pg — AB (ref 26.0–34.0)
MCHC: 32.7 g/dL (ref 30.0–36.0)
MCV: 78.4 fL (ref 78.0–100.0)
PLATELETS: 423 10*3/uL — AB (ref 150–400)
RBC: 3.24 MIL/uL — AB (ref 3.87–5.11)
RDW: 15.3 % (ref 11.5–15.5)
WBC: 12.7 10*3/uL — ABNORMAL HIGH (ref 4.0–10.5)

## 2013-11-18 LAB — SEDIMENTATION RATE: SED RATE: 75 mm/h — AB (ref 0–22)

## 2013-11-18 MED ORDER — BUPROPION HCL ER (XL) 300 MG PO TB24
450.0000 mg | ORAL_TABLET | Freq: Every morning | ORAL | Status: DC
  Administered 2013-11-18 – 2013-11-21 (×3): 450 mg via ORAL
  Filled 2013-11-18 (×5): qty 1

## 2013-11-18 MED ORDER — ALUM & MAG HYDROXIDE-SIMETH 200-200-20 MG/5ML PO SUSP: 30.0000 mL | ORAL | Status: DC | PRN

## 2013-11-18 MED ORDER — AMPHETAMINE-DEXTROAMPHET ER 25 MG PO CP24: 25.0000 mg | ORAL_CAPSULE | Freq: Two times a day (BID) | ORAL | Status: DC

## 2013-11-18 MED ORDER — PIPERACILLIN-TAZOBACTAM 3.375 G IVPB 30 MIN
3.3750 g | Freq: Three times a day (TID) | INTRAVENOUS | Status: DC
Start: 2013-11-18 — End: 2013-11-18

## 2013-11-18 MED ORDER — ZOLPIDEM TARTRATE 5 MG PO TABS
5.0000 mg | ORAL_TABLET | Freq: Every evening | ORAL | Status: DC | PRN
  Administered 2013-11-20: 5 mg via ORAL
  Filled 2013-11-18: qty 1

## 2013-11-18 MED ORDER — ONDANSETRON HCL 4 MG/2ML IJ SOLN
4.0000 mg | Freq: Three times a day (TID) | INTRAMUSCULAR | Status: AC | PRN
  Administered 2013-11-18: 4 mg via INTRAVENOUS
  Filled 2013-11-18: qty 2

## 2013-11-18 MED ORDER — ONDANSETRON 4 MG PO TBDP
4.0000 mg | ORAL_TABLET | Freq: Three times a day (TID) | ORAL | Status: DC | PRN
  Administered 2013-11-18 – 2013-11-19 (×3): 4 mg via ORAL
  Filled 2013-11-18 (×2): qty 1

## 2013-11-18 MED ORDER — OXYCODONE-ACETAMINOPHEN 5-325 MG PO TABS
1.0000 | ORAL_TABLET | ORAL | Status: DC | PRN
  Administered 2013-11-18: 1 via ORAL
  Administered 2013-11-18: 2 via ORAL
  Filled 2013-11-18: qty 2
  Filled 2013-11-18: qty 1

## 2013-11-18 MED ORDER — SODIUM CHLORIDE 0.9 % IV SOLN
INTRAVENOUS | Status: AC
  Administered 2013-11-18: 01:00:00 via INTRAVENOUS

## 2013-11-18 MED ORDER — HYDROMORPHONE HCL PF 1 MG/ML IJ SOLN
1.0000 mg | INTRAMUSCULAR | Status: AC | PRN
  Administered 2013-11-18 (×2): 1 mg via INTRAVENOUS
  Filled 2013-11-18 (×3): qty 1

## 2013-11-18 MED ORDER — SENNA 8.6 MG PO TABS
1.0000 | ORAL_TABLET | Freq: Every day | ORAL | Status: DC | PRN
  Filled 2013-11-18: qty 1

## 2013-11-18 MED ORDER — FLEET ENEMA 7-19 GM/118ML RE ENEM
1.0000 | ENEMA | Freq: Every day | RECTAL | Status: DC | PRN
  Administered 2013-11-18: 1 via RECTAL

## 2013-11-18 MED ORDER — SODIUM CHLORIDE 0.9 % IV SOLN
INTRAVENOUS | Status: DC
  Administered 2013-11-18 – 2013-11-21 (×8): via INTRAVENOUS

## 2013-11-18 MED ORDER — PIPERACILLIN-TAZOBACTAM 3.375 G IVPB
3.3750 g | Freq: Three times a day (TID) | INTRAVENOUS | Status: DC
  Administered 2013-11-18 – 2013-11-21 (×11): 3.375 g via INTRAVENOUS
  Filled 2013-11-18 (×13): qty 50

## 2013-11-18 MED ORDER — IBUPROFEN 600 MG PO TABS
600.0000 mg | ORAL_TABLET | Freq: Four times a day (QID) | ORAL | Status: DC | PRN
  Administered 2013-11-18 (×2): 600 mg via ORAL
  Filled 2013-11-18 (×2): qty 1

## 2013-11-18 MED ORDER — GABAPENTIN 100 MG PO CAPS
100.0000 mg | ORAL_CAPSULE | Freq: Two times a day (BID) | ORAL | Status: DC
  Administered 2013-11-18 – 2013-11-21 (×8): 100 mg via ORAL
  Filled 2013-11-18 (×10): qty 1

## 2013-11-18 MED ORDER — POLYETHYLENE GLYCOL 3350 17 G PO PACK
17.0000 g | PACK | Freq: Every day | ORAL | Status: DC
  Administered 2013-11-18: 17 g via ORAL
  Filled 2013-11-18 (×3): qty 1

## 2013-11-18 MED ORDER — PRENATAL MULTIVITAMIN CH
1.0000 | ORAL_TABLET | Freq: Every day | ORAL | Status: DC
Start: 2013-11-18 — End: 2013-11-20

## 2013-11-18 MED ORDER — ENOXAPARIN SODIUM 40 MG/0.4ML ~~LOC~~ SOLN
40.0000 mg | SUBCUTANEOUS | Status: DC
  Filled 2013-11-18 (×2): qty 0.4

## 2013-11-18 MED ORDER — CYCLOBENZAPRINE HCL 10 MG PO TABS
10.0000 mg | ORAL_TABLET | Freq: Three times a day (TID) | ORAL | Status: DC | PRN
  Filled 2013-11-18: qty 1

## 2013-11-18 NOTE — ED Provider Notes (Signed)
Medical screening examination/treatment/procedure(s) were performed by non-physician practitioner and as supervising physician I was immediately available for consultation/collaboration.  EKG Interpretation    Date/Time:    Ventricular Rate:    PR Interval:    QRS Duration:   QT Interval:    QTC Calculation:   R Axis:     Text Interpretation:                Blanchard Kelch, MD 11/18/13 1144

## 2013-11-18 NOTE — H&P (Signed)
Megan Gonzalez is an 43 y.o. female. She is admitted for antibiotic therapy and consideration of drainage of a central pelvic fluid collection that appears on CT (with Contrast) to represent an abscess, that has developed postoperatively after a vaginal hysterectomy  And release of a pubovaginal sling done 10/27/2013 at [facility] by Drs Linus Mako and Dr Leory Plowman, with pt sent home one day after surgery with foley catheter x 1 wk , removed at [place] then pt has gradually developed intermittent fevers,(felt hot, did not check temp, no thermometer) and constipation with pt having bowel function this week only in response to enemas.  Tonight the pt presented to [facility] ED, where CT contrast shows a central pelvic hourglass shaped rim enhancing fluid collection adjacent to a 5 cm enlarged right ovary, in addition to marked constipation involving the ascending and transverse colon. Appendix is visualized as normal. She has been on No antibiotics prior to admit.   Pertinent Gynecological History: Menses: heavy prior to hysterectomy Bleeding:  Contraception: status post hysterectomy DES exposure: unknown Blood transfusions: none Sexually transmitted diseases: no past history Previous GYN Procedures: vaginal hyst 10/27/13 , release of pubovag sling at same time, prior sling placement and prior effort to correct it  Last mammogram:  Date:  Last pap:  Date:  OB History: G, P   Menstrual History: Menarche age:  Patient's last menstrual period was 04/24/2012.    Past Medical History  Diagnosis Date  . Sleep apnea     CPAP-non compliant  . Headache(784.0)   . Anemia   . Depression   . Arthritis     chronic low back pain  . Anxiety   . Cancer     Past Surgical History  Procedure Laterality Date  . Foot surgery  2006    left  . Svd      x 3  . Colonoscopy  1999  . Tonsillectomy  as child  . Pubovaginal sling  03/24/2012    Procedure: Gaynelle Arabian;  Surgeon: Ailene Rud, MD;  Location: [facility];  Service: Urology;  Laterality: N/A;  lynx with Chemical engineer   . Dilation and curettage of uterus    . Ureteral exploration  5./30/13  . Abdominal hysterectomy    . Removal of urinary sling    . Vaginal hysterectomy N/A 1/2/015    [name], Dr Linus Mako and Dr Leory Plowman, with release of Pubovag sling    Family History  Problem Relation Age of Onset  . Anesthesia problems Neg Hx     Social History:  reports that she has never smoked. She does not have any smokeless tobacco history on file. She reports that she does not drink alcohol or use illicit drugs.  Allergies:  Allergies  Allergen Reactions  . Codeine Nausea And Vomiting  . Penicillins Other (See Comments)    Unknown- childhood reaction    Prescriptions prior to admission  Medication Sig Dispense Refill  . amphetamine-dextroamphetamine (ADDERALL XR) 25 MG 24 hr capsule Take 25 mg by mouth 2 (two) times daily.      Marland Kitchen buPROPion (WELLBUTRIN XL) 150 MG 24 hr tablet Take 450 mg by mouth every morning.      . cyclobenzaprine (FLEXERIL) 10 MG tablet Take 10 mg by mouth 3 (three) times daily as needed for muscle spasms.      . Flaxseed, Linseed, (FLAX SEEDS) POWD Take 1 scoop by mouth daily as needed (digestion).      . gabapentin (NEURONTIN) 100 MG capsule  Take 100 mg by mouth 2 (two) times daily.      Marland Kitchen ibuprofen (ADVIL,MOTRIN) 200 MG tablet Take 600 mg by mouth every 6 (six) hours as needed for moderate pain.      Marland Kitchen lidocaine (LIDODERM) 5 % Place 1 patch onto the skin daily. Remove & Discard patch within 12 hours or as directed by MD      . magnesium hydroxide (MILK OF MAGNESIA) 800 MG/5ML suspension Take 30 mLs by mouth daily as needed for constipation.      . ondansetron (ZOFRAN ODT) 4 MG disintegrating tablet Take 1 tablet (4 mg total) by mouth every 8 (eight) hours as needed for nausea or vomiting.  10 tablet  0  . polyethylene glycol (MIRALAX / GLYCOLAX) packet Take 17 g by mouth  daily.      Marland Kitchen senna (SENOKOT) 8.6 MG tablet Take 1 tablet by mouth daily as needed for constipation.        ROS No vomiting to date. Has had nausea and anorexia the last 2 days, while constipated  Blood pressure 101/60, pulse 115, temperature 98.7 F (37.1 C), temperature source Oral, resp. rate 18, height 5\' 2"  (1.575 m), weight 63.05 kg (139 lb), last menstrual period 04/24/2012, SpO2 99.00%. Physical Exam  Constitutional: She is oriented to person, place, and time. She appears well-developed and well-nourished.  HENT:  Head: Normocephalic.  Eyes: Pupils are equal, round, and reactive to light.  Neck: Neck supple.  Cardiovascular: Normal rate.   Respiratory: Effort normal.  GI: Soft. Bowel sounds are normal. She exhibits distension. She exhibits no mass. There is tenderness. There is guarding. There is no rebound.  Musculoskeletal: Normal range of motion.  Neurological: She is alert and oriented to person, place, and time.  Skin: Skin is warm and dry.  Psychiatric: She has a normal mood and affect. Her behavior is normal. Thought content normal.    Results for orders placed during the hospital encounter of 11/17/13 (from the past 24 hour(s))  URINALYSIS, ROUTINE W REFLEX MICROSCOPIC     Status: Abnormal   Collection Time    11/17/13  7:28 PM      Result Value Range   Color, Urine YELLOW  YELLOW   APPearance CLOUDY (*) CLEAR   Specific Gravity, Urine 1.009  1.005 - 1.030   pH 6.0  5.0 - 8.0   Glucose, UA NEGATIVE  NEGATIVE mg/dL   Hgb urine dipstick NEGATIVE  NEGATIVE   Bilirubin Urine NEGATIVE  NEGATIVE   Ketones, ur NEGATIVE  NEGATIVE mg/dL   Protein, ur NEGATIVE  NEGATIVE mg/dL   Urobilinogen, UA 0.2  0.0 - 1.0 mg/dL   Nitrite NEGATIVE  NEGATIVE   Leukocytes, UA NEGATIVE  NEGATIVE  POCT PREGNANCY, URINE     Status: None   Collection Time    11/17/13  7:35 PM      Result Value Range   Preg Test, Ur NEGATIVE  NEGATIVE  CBC WITH DIFFERENTIAL     Status: Abnormal    Collection Time    11/17/13  7:54 PM      Result Value Range   WBC 15.8 (*) 4.0 - 10.5 K/uL   RBC 3.40 (*) 3.87 - 5.11 MIL/uL   Hemoglobin 8.8 (*) 12.0 - 15.0 g/dL   HCT 26.8 (*) 36.0 - 46.0 %   MCV 78.8  78.0 - 100.0 fL   MCH 25.9 (*) 26.0 - 34.0 pg   MCHC 32.8  30.0 - 36.0 g/dL   RDW  15.1  11.5 - 15.5 %   Platelets 463 (*) 150 - 400 K/uL   Neutrophils Relative % 87 (*) 43 - 77 %   Neutro Abs 13.7 (*) 1.7 - 7.7 K/uL   Lymphocytes Relative 7 (*) 12 - 46 %   Lymphs Abs 1.1  0.7 - 4.0 K/uL   Monocytes Relative 6  3 - 12 %   Monocytes Absolute 0.9  0.1 - 1.0 K/uL   Eosinophils Relative 0  0 - 5 %   Eosinophils Absolute 0.1  0.0 - 0.7 K/uL   Basophils Relative 0  0 - 1 %   Basophils Absolute 0.0  0.0 - 0.1 K/uL  COMPREHENSIVE METABOLIC PANEL     Status: Abnormal   Collection Time    11/17/13  7:54 PM      Result Value Range   Sodium 134 (*) 137 - 147 mEq/L   Potassium 3.7  3.7 - 5.3 mEq/L   Chloride 96  96 - 112 mEq/L   CO2 25  19 - 32 mEq/L   Glucose, Bld 103 (*) 70 - 99 mg/dL   BUN 12  6 - 23 mg/dL   Creatinine, Ser 1.33 (*) 0.50 - 1.10 mg/dL   Calcium 8.3 (*) 8.4 - 10.5 mg/dL   Total Protein 7.1  6.0 - 8.3 g/dL   Albumin 3.1 (*) 3.5 - 5.2 g/dL   AST 39 (*) 0 - 37 U/L   ALT 35  0 - 35 U/L   Alkaline Phosphatase 110  39 - 117 U/L   Total Bilirubin 0.3  0.3 - 1.2 mg/dL   GFR calc non Af Amer 49 (*) >90 mL/min   GFR calc Af Amer 57 (*) >90 mL/min  LIPASE, BLOOD     Status: None   Collection Time    11/17/13  7:54 PM      Result Value Range   Lipase 13  11 - 59 U/L  POCT I-STAT CREATININE     Status: Abnormal   Collection Time    11/17/13  8:02 PM      Result Value Range   Creatinine, Ser 1.40 (*) 0.50 - 1.10 mg/dL    Ct Abdomen Pelvis W Contrast  11/17/2013   CLINICAL DATA:  Lower pelvic pain.  Recent vaginal hysterectomy.  EXAM: CT ABDOMEN AND PELVIS WITH CONTRAST  TECHNIQUE: Multidetector CT imaging of the abdomen and pelvis was performed using the standard  protocol following bolus administration of intravenous contrast.  CONTRAST:  146mL OMNIPAQUE IOHEXOL 300 MG/ML  SOLN  COMPARISON:  US PELVIS COMPLETE dated 10/01/2013; MR LUMBAR SPINE W/O CM dated 08/12/2012  FINDINGS: The visualized lung bases are clear.  The liver and spleen are unremarkable in appearance. The gallbladder is within normal limits. The pancreas and adrenal glands are unremarkable.  There is mild heterogeneity with respect to the enhancement of both kidneys, with a few apparent wedge-shaped defects in enhancement, raising question for underlying mild bilateral pyelonephritis. No significant perinephric stranding is seen. There is no evidence of hydronephrosis. No renal or ureteral stones are identified. Prominence of the right ureter likely reflects the process within the pelvis.  The small bowel is unremarkable in appearance. The stomach is within normal limits. No acute vascular abnormalities are seen.  The appendix is normal in caliber and contains air, without evidence for appendicitis. The colon is partially filled with stool and is unremarkable in appearance, aside soft tissue inflammation at the level of the pelvis. Presacral stranding likely reflects  the acute pelvic process.  There is a large mildly complex collection of peripherally enhancing fluid within the pelvis measuring 10.3 x 7.2 x 3.8 cm, compatible with a large abscess. Surrounding soft tissue inflammation involves the adjacent small and large bowel loops, and the overlying bladder. There is also a 7.3 x 4.5 x 4.8 cm apparent cyst at the right adnexa; this may be physiologic, but an underlying tubo-ovarian abscess cannot be entirely excluded. There is adjacent inflammation of the right ovary and right fallopian tube, which contains a small amount of fluid.  The bladder is mildly distended and anterior displaced, but grossly unremarkable. The patient is status post hysterectomy. There is mild inflammation about both ovaries, with the  right-sided adnexal cystic lesion as described above. No inguinal lymphadenopathy is seen.  No acute osseous abnormalities are identified.  IMPRESSION: 1. Large mildly complex collection of peripherally enhancing fluid in the pelvis measuring 10.3 x 7.2 x 3.8 cm, compatible with a large abscess. Surrounding soft tissue inflammation involves adjacent small and large bowel loops, and the overlying bladder. 7.3 x 4.5 x 4.8 cm apparent cyst at the right adnexa may be physiologic, but underlying tubo-ovarian abscess cannot be entirely excluded. Adjacent inflammation of both ovaries and the right fallopian tube; the right fallopian tube contains a small amount of fluid. This could reflect pyosalpinx. 2. Mild heterogeneity with respect to enhancement of both kidneys, with a few apparent wedge-shaped defects in enhancement, raising question for underlying mild bilateral pyelonephritis. Prominence of the right ureter likely reflects the process within the pelvis.  These results were called by telephone at the time of interpretation on 11/17/2013 at 9:43 PM to Dr. Baron Sane, who verbally acknowledged these results.   Electronically Signed   By: Garald Balding M.D.   On: 11/17/2013 21:44    Assessment/Plan: Central pelvic fluid collection postop s/p hysterectomy 10/27/13,  Probable postop hematoma, now r/o abscess Anemia Constipation S/p urinary retention with recent 10/27/13 release of sling.  hx pubovaginal [place]x sling Dr Gaynelle Arabian 2013  Plan Admit iv antibiotics zosyn, enema and miralax, rehydration,        Consider IR drainage of hematoma/absecess in 3-4 days if not responding to initial care.           Jaimarie Rapozo V 11/18/2013, 2:03 AM

## 2013-11-19 ENCOUNTER — Inpatient Hospital Stay (HOSPITAL_COMMUNITY): Payer: 59 | Admitting: Anesthesiology

## 2013-11-19 ENCOUNTER — Encounter (HOSPITAL_COMMUNITY)
Admission: EM | Disposition: A | Discharge status: Other Acute Inpatient Hospital | Payer: Self-pay | Source: Home / Self Care | Attending: Obstetrics and Gynecology

## 2013-11-19 ENCOUNTER — Encounter (HOSPITAL_COMMUNITY): Payer: 59 | Admitting: Anesthesiology

## 2013-11-19 ENCOUNTER — Encounter (HOSPITAL_COMMUNITY): Payer: Self-pay | Admitting: Anesthesiology

## 2013-11-19 DIAGNOSIS — K5900 Constipation, unspecified: Secondary | ICD-10-CM

## 2013-11-19 DIAGNOSIS — D649 Anemia, unspecified: Secondary | ICD-10-CM

## 2013-11-19 DIAGNOSIS — N731 Chronic parametritis and pelvic cellulitis: Secondary | ICD-10-CM

## 2013-11-19 DIAGNOSIS — IMO0002 Reserved for concepts with insufficient information to code with codable children: Secondary | ICD-10-CM

## 2013-11-19 HISTORY — PX: INCISION AND DRAINAGE ABSCESS: SHX5864

## 2013-11-19 LAB — CBC
HCT: 22.2 % — ABNORMAL LOW (ref 36.0–46.0)
HEMOGLOBIN: 7.2 g/dL — AB (ref 12.0–15.0)
MCH: 25.2 pg — ABNORMAL LOW (ref 26.0–34.0)
MCHC: 32.4 g/dL (ref 30.0–36.0)
MCV: 77.6 fL — ABNORMAL LOW (ref 78.0–100.0)
Platelets: 357 10*3/uL (ref 150–400)
RBC: 2.86 MIL/uL — ABNORMAL LOW (ref 3.87–5.11)
RDW: 15.5 % (ref 11.5–15.5)
WBC: 10.7 10*3/uL — AB (ref 4.0–10.5)

## 2013-11-19 LAB — TYPE AND SCREEN
ABO/RH(D): O POS
Antibody Screen: NEGATIVE

## 2013-11-19 LAB — ABO/RH: ABO/RH(D): O POS

## 2013-11-19 SURGERY — INCISION AND DRAINAGE, ABSCESS
Anesthesia: General

## 2013-11-19 MED ORDER — FENTANYL CITRATE 0.05 MG/ML IJ SOLN
INTRAMUSCULAR | Status: DC | PRN
  Administered 2013-11-19: 100 ug via INTRAVENOUS
  Administered 2013-11-19 (×3): 50 ug via INTRAVENOUS

## 2013-11-19 MED ORDER — LIDOCAINE HCL (CARDIAC) 20 MG/ML IV SOLN
INTRAVENOUS | Status: DC | PRN
  Administered 2013-11-19: 100 mg via INTRAVENOUS

## 2013-11-19 MED ORDER — ONDANSETRON HCL 4 MG/2ML IJ SOLN
INTRAMUSCULAR | Status: AC
  Filled 2013-11-19: qty 2

## 2013-11-19 MED ORDER — ACETAMINOPHEN 325 MG PO TABS
650.0000 mg | ORAL_TABLET | ORAL | Status: AC
  Administered 2013-11-19: 650 mg via ORAL
  Filled 2013-11-19: qty 2

## 2013-11-19 MED ORDER — ONDANSETRON HCL 4 MG/2ML IJ SOLN
INTRAMUSCULAR | Status: DC | PRN
  Administered 2013-11-19: 4 mg via INTRAVENOUS

## 2013-11-19 MED ORDER — DEXAMETHASONE SODIUM PHOSPHATE 10 MG/ML IJ SOLN
INTRAMUSCULAR | Status: DC | PRN
  Administered 2013-11-19: 10 mg via INTRAVENOUS

## 2013-11-19 MED ORDER — BISACODYL 10 MG RE SUPP
10.0000 mg | Freq: Every day | RECTAL | Status: DC | PRN
  Administered 2013-11-19: 10 mg via RECTAL
  Filled 2013-11-19: qty 1

## 2013-11-19 MED ORDER — LACTATED RINGERS IV SOLN
INTRAVENOUS | Status: DC | PRN
  Administered 2013-11-19 (×2): via INTRAVENOUS

## 2013-11-19 MED ORDER — MIDAZOLAM HCL 2 MG/2ML IJ SOLN
INTRAMUSCULAR | Status: DC | PRN
  Administered 2013-11-19: 2 mg via INTRAVENOUS

## 2013-11-19 MED ORDER — MEPERIDINE HCL 25 MG/ML IJ SOLN: 6.2500 mg | INTRAMUSCULAR | Status: DC | PRN

## 2013-11-19 MED ORDER — LIDOCAINE HCL 1 % IJ SOLN
INTRAMUSCULAR | Status: AC
  Filled 2013-11-19: qty 20

## 2013-11-19 MED ORDER — FENTANYL CITRATE 0.05 MG/ML IJ SOLN: 25.0000 ug | INTRAMUSCULAR | Status: DC | PRN

## 2013-11-19 MED ORDER — PROPOFOL 10 MG/ML IV EMUL
INTRAVENOUS | Status: AC
  Filled 2013-11-19: qty 20

## 2013-11-19 MED ORDER — LIDOCAINE HCL (CARDIAC) 20 MG/ML IV SOLN
INTRAVENOUS | Status: AC
  Filled 2013-11-19: qty 5

## 2013-11-19 MED ORDER — DEXAMETHASONE SODIUM PHOSPHATE 10 MG/ML IJ SOLN
INTRAMUSCULAR | Status: AC
  Filled 2013-11-19: qty 1

## 2013-11-19 MED ORDER — MIDAZOLAM HCL 2 MG/2ML IJ SOLN: 0.5000 mg | Freq: Once | INTRAMUSCULAR | Status: AC | PRN

## 2013-11-19 MED ORDER — HYDROMORPHONE HCL PF 1 MG/ML IJ SOLN
1.0000 mg | INTRAMUSCULAR | Status: DC | PRN
  Administered 2013-11-19 – 2013-11-20 (×6): 1 mg via INTRAVENOUS
  Filled 2013-11-19 (×6): qty 1

## 2013-11-19 MED ORDER — ONDANSETRON HCL 4 MG/2ML IJ SOLN: 4.0000 mg | Freq: Four times a day (QID) | INTRAMUSCULAR | Status: DC | PRN

## 2013-11-19 MED ORDER — FENTANYL CITRATE 0.05 MG/ML IJ SOLN
INTRAMUSCULAR | Status: AC
  Filled 2013-11-19: qty 2

## 2013-11-19 MED ORDER — PROPOFOL 10 MG/ML IV BOLUS
INTRAVENOUS | Status: DC | PRN
  Administered 2013-11-19: 50 mg via INTRAVENOUS
  Administered 2013-11-19: 150 mg via INTRAVENOUS
  Administered 2013-11-19: 50 mg via INTRAVENOUS

## 2013-11-19 MED ORDER — MIDAZOLAM HCL 2 MG/2ML IJ SOLN
INTRAMUSCULAR | Status: AC
  Filled 2013-11-19: qty 2

## 2013-11-19 MED ORDER — HYDROMORPHONE HCL 2 MG PO TABS
2.0000 mg | ORAL_TABLET | ORAL | Status: DC | PRN
  Administered 2013-11-19 – 2013-11-21 (×6): 2 mg via ORAL
  Filled 2013-11-19 (×6): qty 1

## 2013-11-19 MED ORDER — PROMETHAZINE HCL 25 MG/ML IJ SOLN: 6.2500 mg | INTRAMUSCULAR | Status: DC | PRN

## 2013-11-19 SURGICAL SUPPLY — 18 items
CATH FOLEY 2WAY SLVR  5CC 14FR (CATHETERS) ×1
CATH FOLEY 2WAY SLVR 5CC 14FR (CATHETERS) IMPLANT
DRAIN JACKSON PRT FLT 7MM (DRAIN) ×1 IMPLANT
DRAPE UNDERBUTTOCKS STRL (DRAPE) ×1 IMPLANT
ELECT REM PT RETURN 9FT ADLT (ELECTROSURGICAL) ×2
ELECTRODE REM PT RTRN 9FT ADLT (ELECTROSURGICAL) IMPLANT
EVACUATOR SILICONE 100CC (DRAIN) ×1 IMPLANT
GAUZE SPONGE 4X4 16PLY XRAY LF (GAUZE/BANDAGES/DRESSINGS) ×1 IMPLANT
GLOVE SS BIOGEL STRL SZ 9 (GLOVE) IMPLANT
GLOVE SUPERSENSE BIOGEL SZ 9 (GLOVE) ×1
GLOVE SURG SS PI 6.5 STRL IVOR (GLOVE) ×2 IMPLANT
GOWN STRL REUS W/TWL XL LVL3 (GOWN DISPOSABLE) ×2 IMPLANT
NS IRRIG 1000ML POUR BTL (IV SOLUTION) ×1 IMPLANT
PACK VAGINAL MINOR WOMEN LF (CUSTOM PROCEDURE TRAY) ×1 IMPLANT
PAD OB MATERNITY 4.3X12.25 (PERSONAL CARE ITEMS) ×1 IMPLANT
SUT MON AB 2-0 CT1 27 (SUTURE) ×1 IMPLANT
SYR 20CC LL (SYRINGE) ×1 IMPLANT
TOWEL OR 17X24 6PK STRL BLUE (TOWEL DISPOSABLE) ×1 IMPLANT

## 2013-11-19 NOTE — Progress Notes (Signed)
Subjective: Patient reports significant pressure in lower abdomen, and sense of rectal pressure. We tried fleets, not tolerated by pt. Bimanual exam repeated and with the rectum empty it is possible to feel the mass extending to cul de sac and significantly more painful to pt  With the exam than when examined on admission .  CTt reviewed, and I feel I & D of pelvic abscess indicated. Patient last ate  Approximately noon, so will post  Colpotomy, incision and drainage of pelvic abscess, vaginal technique.for 8 PM. Additionally the patient has now developed a temp to 102, is on Zosyn, given tylenol, and cultures will be obtained of colpotomy contents at time of surgery Type and screen performed, with pt informed of possibility of need for transfusion, with drain to be placed thru vagina at time of colpotomy. Intended procedure with potential complication such as bleeding , infection progression, need for further surgery should infection progress, or if injury to adjacent organs develops.  Objective: I have reviewed patient's vital signs, intake and output and medications.  General: alert, cooperative and moderate distress Resp: clear to auscultation bilaterally GI: normal findings: bowel sounds normal and abnormal findings:  mass, located on bimanual, markedly tender in cul de sac and at old vaginal cuff closure site. Vaginal Bleeding: none legs negative for Homan's sign   Assessment/Plan: Postoperative vaginal cuff hematoma, now suspected to be infected, with bulging at cul de sac, for colpotomy for drainage, with placement of vaginal cuff drain.   LOS: 2 days    Megan Gonzalez 11/19/2013, 5:59 PM

## 2013-11-19 NOTE — Brief Op Note (Signed)
11/17/2013 - 11/19/2013  10:46 PM  PATIENT:  Megan Gonzalez  43 y.o. female  PRE-OPERATIVE DIAGNOSIS:  Postoperative Cuff Hematoma  POST-OPERATIVE DIAGNOSIS:  Postoperative Cuff Hematoma  PROCEDURE:  Procedure(s): POSTERIOR CULPOTOMY (INCISION AND DRAINAGE OF POST OPERATIVE CUFF HEMATOMA) (N/A) Placement of 7 mm JP drain. SURGEON:  Surgeon(s) and Role:    * Jonnie Kind, MD - Primary  PHYSICIAN ASSISTANT:   ASSISTANTS: none   ANESTHESIA:   general LMA  EBL:  Total I/O In: 2700 [I.V.:2700] Out: 15 [Drains:15]  BLOOD ADMINISTERED:none  DRAINS: (vaginal cuff) Jackson-Pratt drain(s) with closed bulb suction in the vaginal cuff   LOCAL MEDICATIONS USED:  NONE  SPECIMEN:  Source of Specimen:  cuff culture  DISPOSITION OF SPECIMEN:  N/A  COUNTS:  YES  TOURNIQUET:  * No tourniquets in log *  DICTATION: .Dragon Dictation  PLAN OF CARE: has an admit order  PATIENT DISPOSITION:  PACU - hemodynamically stable.   Delay start of Pharmacological VTE agent (>24hrs) due to surgical blood loss or risk of bleeding: not applicable

## 2013-11-19 NOTE — Anesthesia Postprocedure Evaluation (Signed)
Anesthesia Post Note  Patient: Megan Gonzalez  Procedure(s) Performed: Procedure(s) (LRB): POSTERIOR CULPOTOMY (INCISION AND DRAINAGE OF POST OPERATIVE CUFF HEMATOMA) (N/A)  Anesthesia type: General  Patient location: PACU  Post pain: Pain level controlled  Post assessment: Post-op Vital signs reviewed  Last Vitals:  Filed Vitals:   11/19/13 2130  BP: 124/72  Pulse: 113  Temp:   Resp: 16    Post vital signs: Reviewed  Level of consciousness: sedated  Complications: No apparent anesthesia complications

## 2013-11-19 NOTE — Anesthesia Procedure Notes (Signed)
Procedure Name: LMA Insertion Date/Time: 11/19/2013 8:16 PM Performed by: Christain Niznik, Sheron Nightingale Pre-anesthesia Checklist: Patient identified, Timeout performed, Emergency Drugs available, Suction available and Patient being monitored Patient Re-evaluated:Patient Re-evaluated prior to inductionOxygen Delivery Method: Circle system utilized Preoxygenation: Pre-oxygenation with 100% oxygen Intubation Type: IV induction Ventilation: Mask ventilation without difficulty LMA: LMA inserted LMA Size: 4.0 Number of attempts: 1 Tube secured with: at lips. Dental Injury: Teeth and Oropharynx as per pre-operative assessment

## 2013-11-19 NOTE — Anesthesia Preprocedure Evaluation (Signed)
Anesthesia Evaluation  Patient identified by MRN, date of birth, ID band Patient awake    Reviewed: Allergy & Precautions, H&P , Patient's Chart, lab work & pertinent test results, reviewed documented beta blocker date and time   History of Anesthesia Complications Negative for: history of anesthetic complications  Airway Mallampati: II TM Distance: >3 FB Neck ROM: full    Dental   Pulmonary sleep apnea ,  breath sounds clear to auscultation        Cardiovascular Exercise Tolerance: Good Rhythm:regular Rate:Normal     Neuro/Psych  Headaches, PSYCHIATRIC DISORDERS Anxiety Depression negative psych ROS   GI/Hepatic   Endo/Other    Renal/GU      Musculoskeletal   Abdominal   Peds  Hematology  (+) anemia ,   Anesthesia Other Findings Sleep apnea CPAP noncompliant  Reproductive/Obstetrics                           Anesthesia Physical Anesthesia Plan  ASA: II and emergent  Anesthesia Plan: General LMA   Post-op Pain Management:    Induction:   Airway Management Planned:   Additional Equipment:   Intra-op Plan:   Post-operative Plan:   Informed Consent: I have reviewed the patients History and Physical, chart, labs and discussed the procedure including the risks, benefits and alternatives for the proposed anesthesia with the patient or authorized representative who has indicated his/her understanding and acceptance.   Dental Advisory Given  Plan Discussed with: CRNA, Surgeon and Anesthesiologist  Anesthesia Plan Comments:         Anesthesia Quick Evaluation

## 2013-11-19 NOTE — Transfer of Care (Signed)
Immediate Anesthesia Transfer of Care Note  Patient: Megan Gonzalez  Procedure(s) Performed: Procedure(s): POSTERIOR CULPOTOMY (INCISION AND DRAINAGE OF POST OPERATIVE CUFF HEMATOMA) (N/A)  Patient Location: PACU  Anesthesia Type:General  Level of Consciousness: awake, alert  and oriented  Airway & Oxygen Therapy: Patient Spontanous Breathing and Patient connected to nasal cannula oxygen  Post-op Assessment: Report given to PACU RN and Post -op Vital signs reviewed and stable  Post vital signs: Reviewed and stable  Complications: No apparent anesthesia complications

## 2013-11-19 NOTE — Op Note (Signed)
Procedure: Posterior vaginal colpotomy,, placement of vaginal 7 mm JP drain Surgeon Glo Herring Anesthesia Gen. with LMA Details of procedure: Patient was taken operating room prepped and draped after general anesthesia introduced with LMA in place. Since legs were in low lithotomy like support perineum and vagina were sterilely prepped, the patient received antibiotics and timeout was conducted. Next exam under anesthesia showed a well-healed cuff and a firm hard mass beneath the vaginal cuff, and extending posteriorly into the cul-de-sac Vaginal side lower tractors were placed anteriorly and posteriorly to identify the vaginal cuff.. The cuff closure could be taken down beginning on the patient's right side were removed and the stitch in opening the vaginal epithelium for distance approximate 1.5 cm, and a #14 uterine sound, and Kelly clamp could be ultimately used to probe through the vaginal cuff in a channel was identified and the uterine sound passed approximately 8-9 cm above the cuff, without any resistance. There was no blood released by this process. Kelly clamp to be passed as well, and slightly anteflexed position. A Pool suction could be passed into the suspected hematoma, and in no fluid was collected. 10 cc of saline was injected through the Pool suction, and a few cc to 3 removed under aspiration. A 7 mm flat JP drain was trimmed for length of approximately 5 cm of suction holes, and slid into the suspected hematoma, and allowed to exit through the vagina. It was sewn to the vaginal sidewall just inside the hymenal remnant on the right with 2-0 Monocryl for ease of removal. The aspirated saline solution obtained once the suction was initiated was collected and will be sent for culture Post insertion, and a double gloved digital rectal exam was performed the rectal vault was completely empty, and the rectum seemed to extend beneath the mass effect which filled the cul-de-sac (pouch of Douglas).  Again, the mass effect was firm, hard and without any fluctuance patient was allowed to be awakened and go to recovery in stable condition.

## 2013-11-19 NOTE — Progress Notes (Signed)
Subjective: constipated, leaks urine, nausea and vomiting Patient reports nausea, vomiting and tolerating PO.    Objective: I have reviewed patient's vital signs, intake and output, medications, labs and radiology results.   Filed Vitals:   11/18/13 1800 11/18/13 2118 11/19/13 0211 11/19/13 0526  BP: 111/66 97/52 89/51  90/55  Pulse: 77 98 98 99  Temp: 99 F (37.2 C) 99.7 F (37.6 C) 98.1 F (36.7 C) 98.6 F (37 C)  TempSrc: Oral Oral Oral Oral  Resp: 18 18 16 16   Height:      Weight:      SpO2: 100% 98% 98% 99%    General: alert, cooperative and no distress GI: mildly distended no tenderness no mass CBC    Component Value Date/Time   WBC 10.7* 11/19/2013 0515   RBC 2.86* 11/19/2013 0515   HGB 7.2* 11/19/2013 0515   HCT 22.2* 11/19/2013 0515   PLT 357 11/19/2013 0515   MCV 77.6* 11/19/2013 0515   MCH 25.2* 11/19/2013 0515   MCHC 32.4 11/19/2013 0515   RDW 15.5 11/19/2013 0515   LYMPHSABS 1.1 11/17/2013 1954   MONOABS 0.9 11/17/2013 1954   EOSABS 0.1 11/17/2013 1954   BASOSABS 0.0 11/17/2013 1954      Assessment/Plan: Post op 3 weeks TVH with abscess vs hematoma. Dulcolax, change pain med.   LOS: 2 days    ARNOLD,JAMES 11/19/2013, 7:02 AM

## 2013-11-20 ENCOUNTER — Encounter (HOSPITAL_COMMUNITY): Payer: Self-pay | Admitting: Obstetrics and Gynecology

## 2013-11-20 DIAGNOSIS — N731 Chronic parametritis and pelvic cellulitis: Secondary | ICD-10-CM

## 2013-11-20 MED ORDER — POLYETHYLENE GLYCOL 3350 17 G PO PACK
17.0000 g | PACK | Freq: Three times a day (TID) | ORAL | Status: AC
  Administered 2013-11-20 (×2): 17 g via ORAL
  Filled 2013-11-20 (×5): qty 1

## 2013-11-20 MED ORDER — MAGNESIUM HYDROXIDE 400 MG/5ML PO SUSP
30.0000 mL | Freq: Every day | ORAL | Status: DC | PRN
  Administered 2013-11-20: 30 mL via ORAL
  Filled 2013-11-20: qty 30

## 2013-11-20 MED ORDER — ENOXAPARIN SODIUM 40 MG/0.4ML ~~LOC~~ SOLN
40.0000 mg | SUBCUTANEOUS | Status: DC
  Administered 2013-11-20 – 2013-11-21 (×2): 40 mg via SUBCUTANEOUS
  Filled 2013-11-20 (×3): qty 0.4

## 2013-11-20 MED ORDER — FLEET ENEMA 7-19 GM/118ML RE ENEM
1.0000 | ENEMA | Freq: Three times a day (TID) | RECTAL | Status: DC | PRN
  Administered 2013-11-20 (×2): 1 via RECTAL

## 2013-11-20 NOTE — Anesthesia Postprocedure Evaluation (Signed)
Anesthesia Post Note  Patient: Megan Gonzalez  Procedure(s) Performed: Procedure(s) (LRB): POSTERIOR CULPOTOMY (INCISION AND DRAINAGE OF POST OPERATIVE CUFF HEMATOMA) (N/A)  Anesthesia type: General  Patient location: Mother/Baby  Post pain: Pain level controlled  Post assessment: Post-op Vital signs reviewed  Last Vitals:  Filed Vitals:   11/20/13 0656  BP: 111/73  Pulse: 78  Temp: 36.4 C  Resp: 17    Post vital signs: Reviewed  Level of consciousness: awake and alert   Complications: No apparent anesthesia complications

## 2013-11-20 NOTE — Progress Notes (Signed)
Ur chart review completed.  

## 2013-11-20 NOTE — Progress Notes (Signed)
1 Day Post-Op Procedure(s) (LRB): POSTERIOR CULPOTOMY (INCISION AND DRAINAGE OF POST OPERATIVE CUFF HEMATOMA) (N/A) HD #4 Subjective: Patient reports no problems voiding. Overall, she says that she feels "less full". She is hungry and is requesting a regular diet.   Objective: I have reviewed patient's vital signs, intake and output, medications and labs.  General: alert Resp: clear to auscultation bilaterally Cardio: regular rate and rhythm, S1, S2 normal, no murmur, click, rub or gallop GI: soft, non-tender; bowel sounds normal; no masses,  no organomegaly The JP has drained 15 since 0530, serosanguinous fluid noted  Assessment: s/p Procedure(s): POSTERIOR CULPOTOMY (INCISION AND DRAINAGE OF POST OPERATIVE CUFF HEMATOMA) (N/A): stable  Plan: Advance diet Encourage ambulation continue to follow drain output  LOS: 3 days    Sasha Rueth C. 11/20/2013, 9:29 AM

## 2013-11-21 NOTE — Progress Notes (Signed)
Pt transferred at 1759 via [facility]

## 2013-11-21 NOTE — Progress Notes (Signed)
[facility] on floor to transfer pt at 1739

## 2013-11-21 NOTE — Discharge Summary (Signed)
Physician Discharge/External Transfer Summary  Patient ID: Megan Gonzalez MRN: [id number] DOB/AGE: 1971-09-24 43 y.o.  Admit date: 11/17/2013 Transfer date: 11/21/2013  Admission and Active Diagnoses: Active Problems:   Pelvic abscess in female   Constipation, postoperative  Procedures: Posterior vaginal colpotomy, placement of vaginal 7 mm JP drain on 11/19/2013  Transfer Condition: [place] Hospital Course: Patient was admitted on 11/17/13 after presenting to our ED with diffuse abdominal cramping after undergoing a vaginal hysterectomy, sling removal, repair of urethral defect, McCall's culdoplasty and cystourethroscopy on 10/27/13 at [facility] on 10/27/13 by Dr. Laurene Footman.  She was was discharged on POD#1 with a foley catheter in place for seven days, this was removed in office.  Since surgery, patient reported worsening constipation and pain, and presented to the [place]. CT scan showed a 10 cm rim enhancing fluid collection adjacent to the enlarged right ovary, small and large bowel.  The scan also showed marked constipation involving the ascending and transverse colon.  GYN service was consulted and the patient was transferred to [facility] for further management.  On 11/19/2013, she underwent a posterior vaginal colpotomy, placement of vaginal 7 mm JP drain which helped drain the abscess.  Patient was also treated with Zosyn.  Her severe constipation was treated with several agents, she only managed to have very small stools but was still very uncomfortable.  On 11/21/2013, patient reported not being satisfied with the care she was receiving her and wanted to be transferred to [facility], to be under the care of her doctor, Dr. Linus Mako.  Dr. Linus Mako called our service, and agreed with this plan of care.  Significant Diagnostic Studies:  Results for orders placed during the hospital encounter of 11/17/13 (from the past 168 hour(s))  URINALYSIS, ROUTINE W REFLEX  MICROSCOPIC   Collection Time    11/17/13  7:28 PM      Result Value Range   Color, Urine YELLOW  YELLOW   APPearance CLOUDY (*) CLEAR   Specific Gravity, Urine 1.009  1.005 - 1.030   pH 6.0  5.0 - 8.0   Glucose, UA NEGATIVE  NEGATIVE mg/dL   Hgb urine dipstick NEGATIVE  NEGATIVE   Bilirubin Urine NEGATIVE  NEGATIVE   Ketones, ur NEGATIVE  NEGATIVE mg/dL   Protein, ur NEGATIVE  NEGATIVE mg/dL   Urobilinogen, UA 0.2  0.0 - 1.0 mg/dL   Nitrite NEGATIVE  NEGATIVE   Leukocytes, UA NEGATIVE  NEGATIVE  POCT PREGNANCY, URINE   Collection Time    11/17/13  7:35 PM      Result Value Range   Preg Test, Ur NEGATIVE  NEGATIVE  CBC WITH DIFFERENTIAL   Collection Time    11/17/13  7:54 PM      Result Value Range   WBC 15.8 (*) 4.0 - 10.5 K/uL   RBC 3.40 (*) 3.87 - 5.11 MIL/uL   Hemoglobin 8.8 (*) 12.0 - 15.0 g/dL   HCT 26.8 (*) 36.0 - 46.0 %   MCV 78.8  78.0 - 100.0 fL   MCH 25.9 (*) 26.0 - 34.0 pg   MCHC 32.8  30.0 - 36.0 g/dL   RDW 15.1  11.5 - 15.5 %   Platelets 463 (*) 150 - 400 K/uL   Neutrophils Relative % 87 (*) 43 - 77 %   Neutro Abs 13.7 (*) 1.7 - 7.7 K/uL   Lymphocytes Relative 7 (*) 12 - 46 %   Lymphs Abs 1.1  0.7 - 4.0 K/uL   Monocytes  Relative 6  3 - 12 %   Monocytes Absolute 0.9  0.1 - 1.0 K/uL   Eosinophils Relative 0  0 - 5 %   Eosinophils Absolute 0.1  0.0 - 0.7 K/uL   Basophils Relative 0  0 - 1 %   Basophils Absolute 0.0  0.0 - 0.1 K/uL  COMPREHENSIVE METABOLIC PANEL   Collection Time    11/17/13  7:54 PM      Result Value Range   Sodium 134 (*) 137 - 147 mEq/L   Potassium 3.7  3.7 - 5.3 mEq/L   Chloride 96  96 - 112 mEq/L   CO2 25  19 - 32 mEq/L   Glucose, Bld 103 (*) 70 - 99 mg/dL   BUN 12  6 - 23 mg/dL   Creatinine, Ser 1.33 (*) 0.50 - 1.10 mg/dL   Calcium 8.3 (*) 8.4 - 10.5 mg/dL   Total Protein 7.1  6.0 - 8.3 g/dL   Albumin 3.1 (*) 3.5 - 5.2 g/dL   AST 39 (*) 0 - 37 U/L   ALT 35  0 - 35 U/L   Alkaline Phosphatase 110  39 - 117 U/L   Total  Bilirubin 0.3  0.3 - 1.2 mg/dL   GFR calc non Af Amer 49 (*) >90 mL/min   GFR calc Af Amer 57 (*) >90 mL/min  LIPASE, BLOOD   Collection Time    11/17/13  7:54 PM      Result Value Range   Lipase 13  11 - 59 U/L  POCT I-STAT CREATININE   Collection Time    11/17/13  8:02 PM      Result Value Range   Creatinine, Ser 1.40 (*) 0.50 - 1.10 mg/dL  BASIC METABOLIC PANEL   Collection Time    11/18/13  5:20 AM      Result Value Range   Sodium 140  137 - 147 mEq/L   Potassium 4.1  3.7 - 5.3 mEq/L   Chloride 103  96 - 112 mEq/L   CO2 27  19 - 32 mEq/L   Glucose, Bld 107 (*) 70 - 99 mg/dL   BUN 8  6 - 23 mg/dL   Creatinine, Ser 1.13 (*) 0.50 - 1.10 mg/dL   Calcium 8.0 (*) 8.4 - 10.5 mg/dL   GFR calc non Af Amer 60 (*) >90 mL/min   GFR calc Af Amer 69 (*) >90 mL/min  CBC   Collection Time    11/18/13  5:20 AM      Result Value Range   WBC 12.7 (*) 4.0 - 10.5 K/uL   RBC 3.24 (*) 3.87 - 5.11 MIL/uL   Hemoglobin 8.3 (*) 12.0 - 15.0 g/dL   HCT 25.4 (*) 36.0 - 46.0 %   MCV 78.4  78.0 - 100.0 fL   MCH 25.6 (*) 26.0 - 34.0 pg   MCHC 32.7  30.0 - 36.0 g/dL   RDW 15.3  11.5 - 15.5 %   Platelets 423 (*) 150 - 400 K/uL  SEDIMENTATION RATE   Collection Time    11/18/13  5:20 AM      Result Value Range   Sed Rate 75 (*) 0 - 22 mm/hr  CBC   Collection Time    11/19/13  5:15 AM      Result Value Range   WBC 10.7 (*) 4.0 - 10.5 K/uL   RBC 2.86 (*) 3.87 - 5.11 MIL/uL   Hemoglobin 7.2 (*) 12.0 - 15.0 g/dL   HCT  22.2 (*) 36.0 - 46.0 %   MCV 77.6 (*) 78.0 - 100.0 fL   MCH 25.2 (*) 26.0 - 34.0 pg   MCHC 32.4  30.0 - 36.0 g/dL   RDW 15.5  11.5 - 15.5 %   Platelets 357  150 - 400 K/uL  TYPE AND SCREEN   Collection Time    11/19/13  5:55 PM      Result Value Range   ABO/RH(D) O POS     Antibody Screen NEG     Sample Expiration 11/22/2013    ABO/RH   Collection Time    11/19/13  5:55 PM      Result Value Range   ABO/RH(D) O POS    WOUND CULTURE   Collection Time    11/19/13  8:55  PM      Result Value Range   Specimen Description VAGINA VAGINAL CUFF     Special Requests Normal     Gram Stain       Value: ABUNDANT WBC PRESENT,BOTH PMN AND MONONUCLEAR     NO SQUAMOUS EPITHELIAL CELLS SEEN     NO ORGANISMS SEEN     Performed at [facility]   Culture       Value: NO GROWTH 1 DAY     Performed at [facility]   Report Status PENDING       11/17/2013    CT ABDOMEN AND PELVIS WITH CONTRAST CLINICAL DATA:  Lower pelvic pain.  Recent vaginal hysterectomy.  TECHNIQUE: Multidetector CT imaging of the abdomen and pelvis was performed using the standard protocol following bolus administration of intravenous contrast.  CONTRAST:  124mL OMNIPAQUE IOHEXOL 300 MG/ML  SOLN  COMPARISON:  US PELVIS COMPLETE dated 10/01/2013; MR LUMBAR SPINE W/O CM dated 08/12/2012  FINDINGS: The visualized lung bases are clear.  The liver and spleen are unremarkable in appearance. The gallbladder is within normal limits. The pancreas and adrenal glands are unremarkable.  There is mild heterogeneity with respect to the enhancement of both kidneys, with a few apparent wedge-shaped defects in enhancement, raising question for underlying mild bilateral pyelonephritis. No significant perinephric stranding is seen. There is no evidence of hydronephrosis. No renal or ureteral stones are identified. Prominence of the right ureter likely reflects the process within the pelvis.  The small bowel is unremarkable in appearance. The stomach is within normal limits. No acute vascular abnormalities are seen.  The appendix is normal in caliber and contains air, without evidence for appendicitis. The colon is partially filled with stool and is unremarkable in appearance, aside soft tissue inflammation at the level of the pelvis. Presacral stranding likely reflects the acute pelvic process.  There is a large mildly complex collection of peripherally enhancing fluid within the pelvis measuring 10.3 x 7.2 x 3.8 cm,  compatible with a large abscess. Surrounding soft tissue inflammation involves the adjacent small and large bowel loops, and the overlying bladder. There is also a 7.3 x 4.5 x 4.8 cm apparent cyst at the right adnexa; this may be physiologic, but an underlying tubo-ovarian abscess cannot be entirely excluded. There is adjacent inflammation of the right ovary and right fallopian tube, which contains a small amount of fluid.  The bladder is mildly distended and anterior displaced, but grossly unremarkable. The patient is status post hysterectomy. There is mild inflammation about both ovaries, with the right-sided adnexal cystic lesion as described above. No inguinal lymphadenopathy is seen.  No acute osseous abnormalities are identified.  IMPRESSION: 1. Large  mildly complex collection of peripherally enhancing fluid in the pelvis measuring 10.3 x 7.2 x 3.8 cm, compatible with a large abscess. Surrounding soft tissue inflammation involves adjacent small and large bowel loops, and the overlying bladder. 7.3 x 4.5 x 4.8 cm apparent cyst at the right adnexa may be physiologic, but underlying tubo-ovarian abscess cannot be entirely excluded. Adjacent inflammation of both ovaries and the right fallopian tube; the right fallopian tube contains a small amount of fluid. This could reflect pyosalpinx. 2. Mild heterogeneity with respect to enhancement of both kidneys, with a few apparent wedge-shaped defects in enhancement, raising question for underlying mild bilateral pyelonephritis. Prominence of the right ureter likely reflects the process within the pelvis.  These results were called by telephone at the time of interpretation on 11/17/2013 at 9:43 PM to Dr. Baron Sane, who verbally acknowledged these results.   Electronically Signed   By: Garald Balding M.D.   On: 11/17/2013 21:44    Transfer Exam: Blood pressure 95/67, pulse 100, temperature 98.3 F (36.8 C), temperature source Oral, resp. rate 16, height 5'  2" (1.575 m), weight 140 lb (63.504 kg), last menstrual period 04/24/2012, SpO2 97.00%. General appearance: alert and no distress Resp: clear to auscultation bilaterally Cardio: regular rate and rhythm GI: soft, mild-moderate diffuse tenderness; bowel sounds normal; no organomegaly Pelvic: Rectal exam revealed no stool in rectal vault, no stitches palpated at the level of the McCall's culdoplasty Extremities: extremities normal, atraumatic, no cyanosis or edema and Homans sign is negative, no sign of DVT Neurologic: Grossly normal  Disposition: Transfer to [facility]    Medication List         amphetamine-dextroamphetamine 25 MG 24 hr capsule  Commonly known as:  ADDERALL XR  Take 25 mg by mouth 2 (two) times daily.     buPROPion 150 MG 24 hr tablet  Commonly known as:  WELLBUTRIN XL  Take 450 mg by mouth every morning.     cyclobenzaprine 10 MG tablet  Commonly known as:  FLEXERIL  Take 10 mg by mouth 3 (three) times daily as needed for muscle spasms.     Flax Seeds Powd  Take 1 scoop by mouth daily as needed (digestion).     gabapentin 100 MG capsule  Commonly known as:  NEURONTIN  Take 100 mg by mouth 2 (two) times daily.     ibuprofen 200 MG tablet  Commonly known as:  ADVIL,MOTRIN  Take 600 mg by mouth every 6 (six) hours as needed for moderate pain.     lidocaine 5 %  Commonly known as:  LIDODERM  Place 1 patch onto the skin daily. Remove & Discard patch within 12 hours or as directed by MD     magnesium hydroxide 800 MG/5ML suspension  Commonly known as:  MILK OF MAGNESIA  Take 30 mLs by mouth daily as needed for constipation.     ondansetron 4 MG disintegrating tablet  Commonly known as:  ZOFRAN ODT  Take 1 tablet (4 mg total) by mouth every 8 (eight) hours as needed for nausea or vomiting.     polyethylene glycol packet  Commonly known as:  MIRALAX / GLYCOLAX  Take 17 g by mouth daily.     senna 8.6 MG tablet  Commonly known as:   SENOKOT  Take 1 tablet by mouth daily as needed for constipation.         Signed: Osborne Oman, MD  11/21/2013, 11:34 AM

## 2013-11-21 NOTE — Progress Notes (Signed)
2 Days Post-Op Procedure(s) (LRB): POSTERIOR CULPOTOMY (INCISION AND DRAINAGE OF POST OPERATIVE CUFF HEMATOMA) (N/A)  Subjective: Patient reports that she still can't have a BM despite multiple enemas, Miralax, milk of magnesia yesterday. The discomfort from this constipation is her complaint.   Objective: I have reviewed patient's vital signs, intake and output, medications, labs and microbiology.  General: alert Resp: clear to auscultation bilaterally Cardio: regular rate and rhythm, S1, S2 normal, no murmur, click, rub or gallop GI: soft, non-tender; bowel sounds normal; no masses,  no organomegaly  Assessment: s/p Procedure(s): POSTERIOR CULPOTOMY (INCISION AND DRAINAGE OF POST OPERATIVE CUFF HEMATOMA) (N/A): stable  Plan: Encourage ambulation Consider consult with GI regarding constipation.  LOS: 4 days    Xzavian Semmel C. 11/21/2013, 7:17 AM

## 2013-11-21 NOTE — Progress Notes (Signed)
Report called to Lenna Sciara, RN on surgical floor at [facility].

## 2013-11-21 NOTE — Progress Notes (Signed)
Pt had inconsolable breakdown, crying and visibly upset about unable to have a BM

## 2013-11-22 LAB — WOUND CULTURE
CULTURE: NO GROWTH
SPECIAL REQUESTS: NORMAL

## 2014-01-12 LAB — PULMONARY FUNCTION TEST

## 2014-03-28 ENCOUNTER — Other Ambulatory Visit: Payer: Self-pay | Admitting: Gastroenterology

## 2014-03-28 DIAGNOSIS — R109 Unspecified abdominal pain: Secondary | ICD-10-CM

## 2014-03-28 DIAGNOSIS — K5900 Constipation, unspecified: Secondary | ICD-10-CM

## 2014-03-30 ENCOUNTER — Ambulatory Visit
Admission: RE | Admit: 2014-03-30 | Discharge: 2014-03-30 | Disposition: A | Discharge status: Home / Self Care | Payer: 59 | Source: Ambulatory Visit | Attending: Gastroenterology | Admitting: Gastroenterology

## 2014-03-30 DIAGNOSIS — R109 Unspecified abdominal pain: Secondary | ICD-10-CM

## 2014-03-30 DIAGNOSIS — K5900 Constipation, unspecified: Secondary | ICD-10-CM

## 2014-06-11 ENCOUNTER — Other Ambulatory Visit: Payer: Self-pay | Admitting: Neurosurgery

## 2014-06-11 DIAGNOSIS — M79603 Pain in arm, unspecified: Secondary | ICD-10-CM

## 2014-06-11 DIAGNOSIS — G4489 Other headache syndrome: Secondary | ICD-10-CM

## 2014-06-16 ENCOUNTER — Other Ambulatory Visit: Payer: 59

## 2014-06-21 ENCOUNTER — Other Ambulatory Visit (HOSPITAL_COMMUNITY): Payer: Self-pay | Admitting: Neurosurgery

## 2014-06-21 DIAGNOSIS — R209 Unspecified disturbances of skin sensation: Secondary | ICD-10-CM

## 2014-06-21 DIAGNOSIS — G4489 Other headache syndrome: Secondary | ICD-10-CM

## 2014-06-21 DIAGNOSIS — M79603 Pain in arm, unspecified: Secondary | ICD-10-CM

## 2014-06-22 ENCOUNTER — Ambulatory Visit (HOSPITAL_COMMUNITY)
Admission: RE | Admit: 2014-06-22 | Discharge: 2014-06-22 | Disposition: A | Discharge status: Home / Self Care | Payer: 59 | Source: Ambulatory Visit | Attending: Neurosurgery | Admitting: Neurosurgery

## 2014-06-22 ENCOUNTER — Ambulatory Visit (HOSPITAL_COMMUNITY): Admission: RE | Admit: 2014-06-22 | Payer: 59 | Source: Ambulatory Visit

## 2014-06-22 DIAGNOSIS — R209 Unspecified disturbances of skin sensation: Secondary | ICD-10-CM | POA: Insufficient documentation

## 2014-06-22 DIAGNOSIS — G4489 Other headache syndrome: Secondary | ICD-10-CM

## 2014-06-22 DIAGNOSIS — M79603 Pain in arm, unspecified: Secondary | ICD-10-CM

## 2014-06-22 IMAGING — CT CT CERVICAL SPINE W/O CM
4 series · 17 of 33 positions shown, 20 images · non-contrast
Comparison: None.

CLINICAL DATA: Disturbance of skin sensation right arm

EXAM:
CT CERVICAL SPINE WITHOUT CONTRAST
TECHNIQUE: Multidetector CT imaging of the cervical spine was performed without
intravenous contrast. Multiplanar CT image reconstructions were also
generated.

[Series 3: c_spine 2.0 b31s · axial · 0.29mm/px · z∈[-172,-106]mm · 3 of 83 slices shown]
[im 17/83  bone]
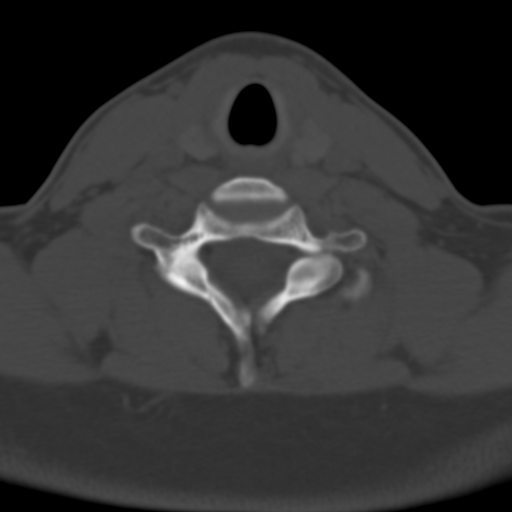
[im 33/83  bone]
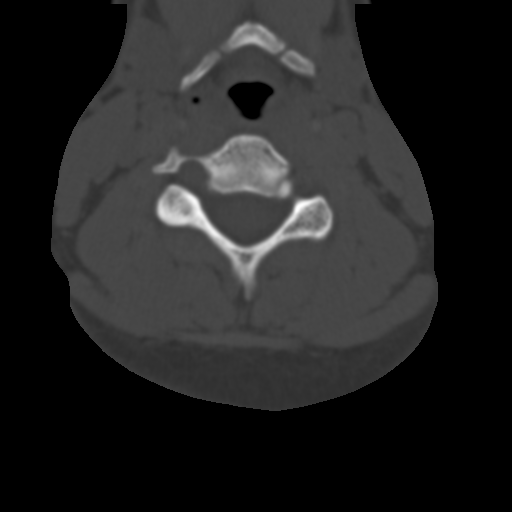
[im 50/83  bone]
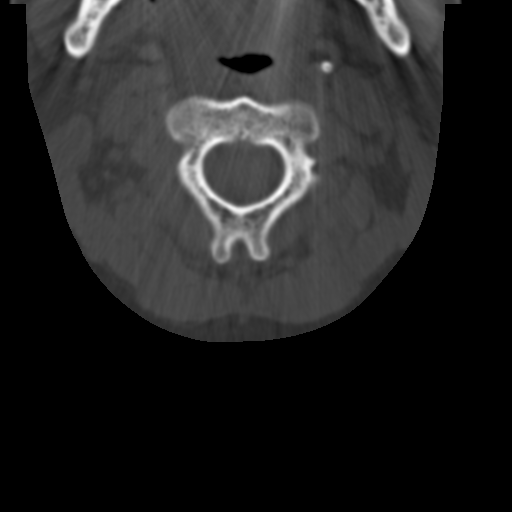

[Series 602: axial reformats · axial · 0.32mm/px · z∈[-231,-106]mm · 6 of 97 slices shown, 8 images]
[im 14/97  soft-tissue]
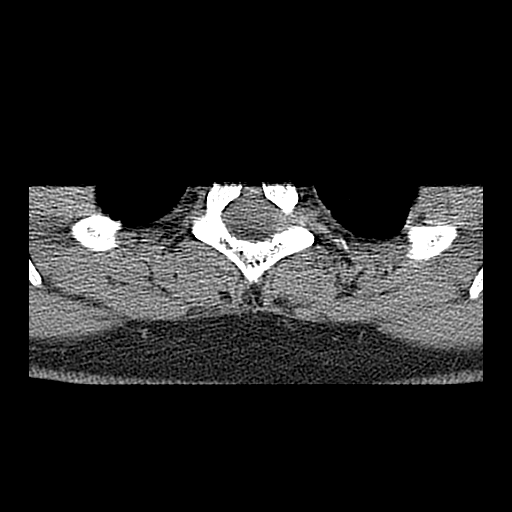
[im 14/97  bone]
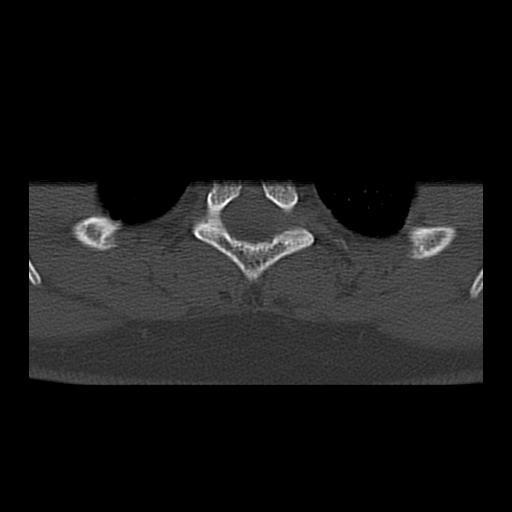
[im 28/97  bone]
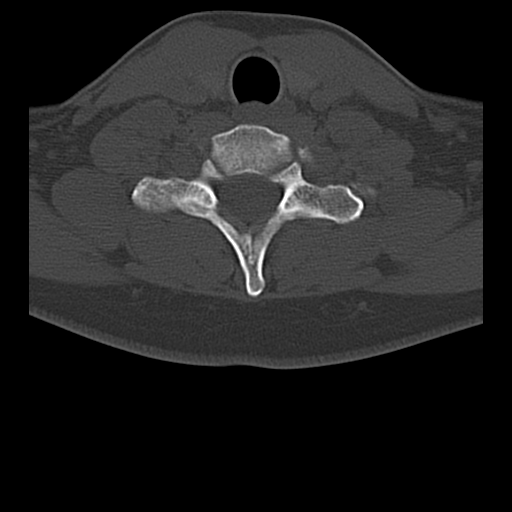
[im 42/97  bone]
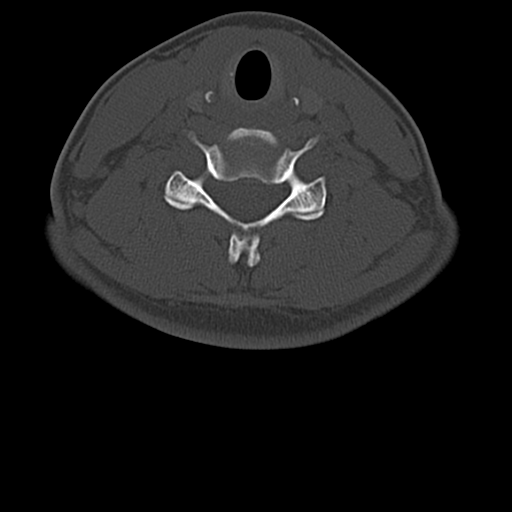
[im 55/97  bone]
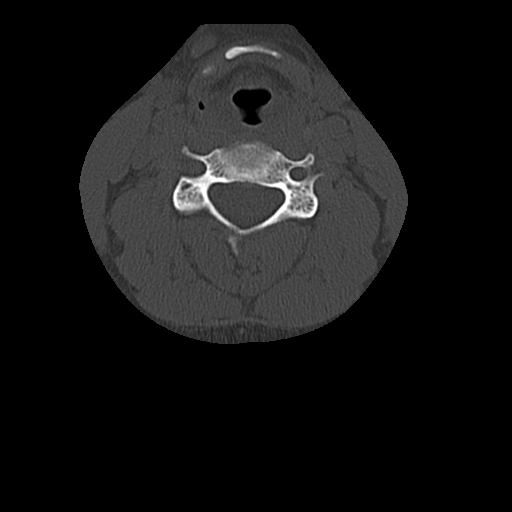
[im 69/97  soft-tissue]
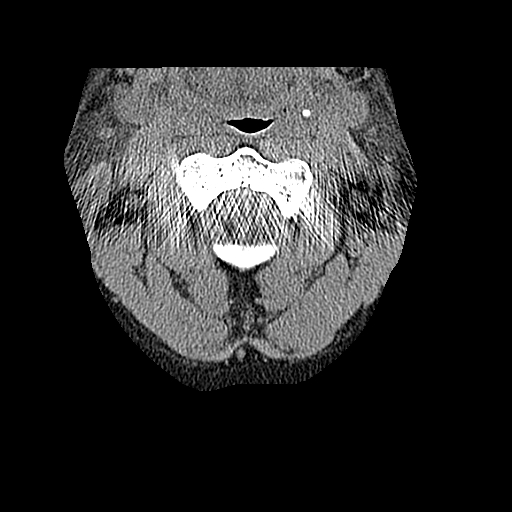
[im 69/97  bone]
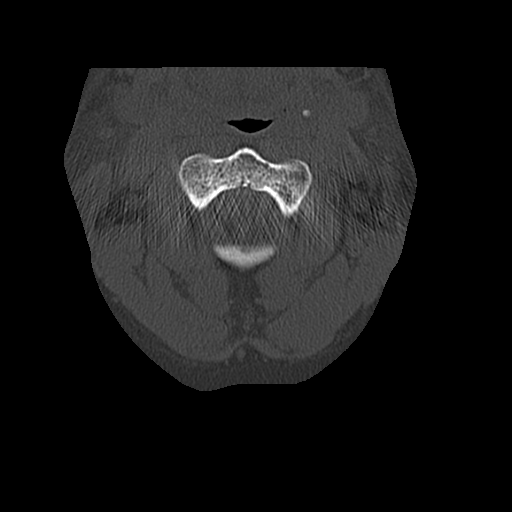
[im 83/97  bone]
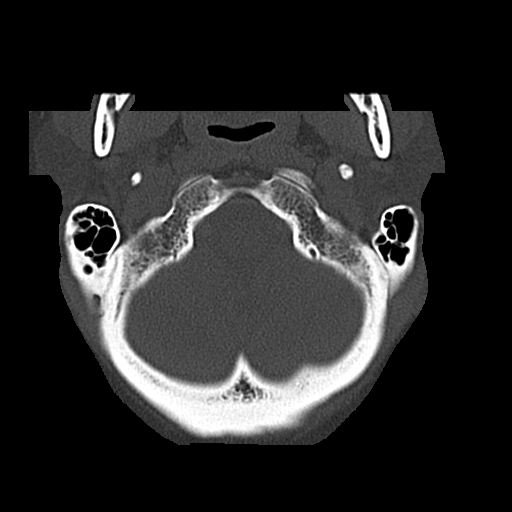

[Series 603: coronal images · coronal · 0.32mm/px · 3 of 49 slices shown]
[im 10/49  bone]
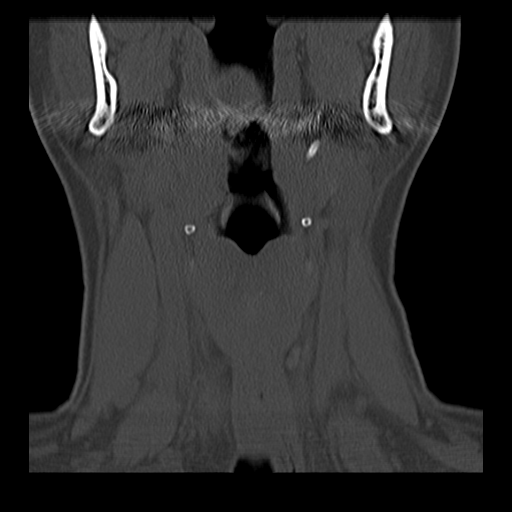
[im 20/49  bone]
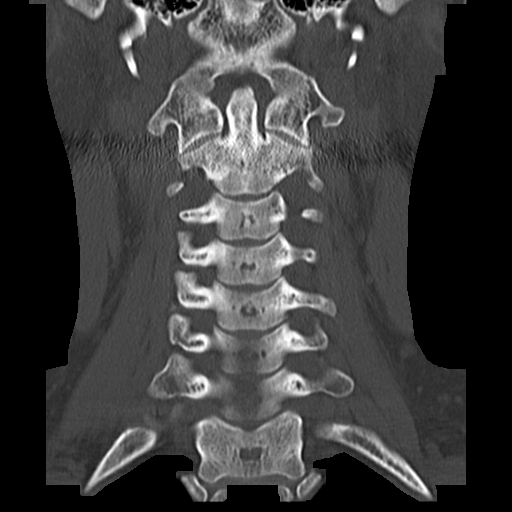
[im 29/49  bone]
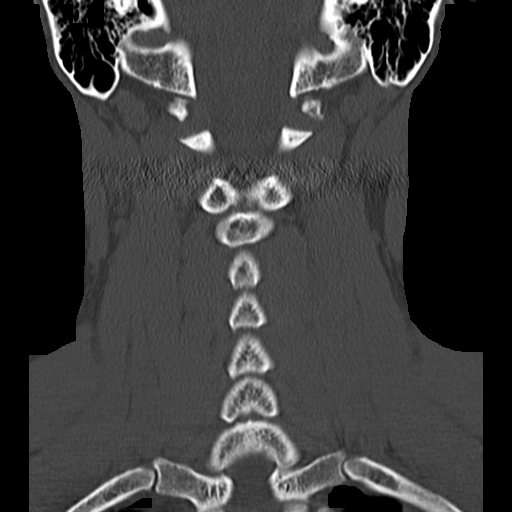

[Series 604: sagittal images · sagittal · 0.32mm/px · 5 of 48 slices shown, 6 images]
[im 16/48  bone]
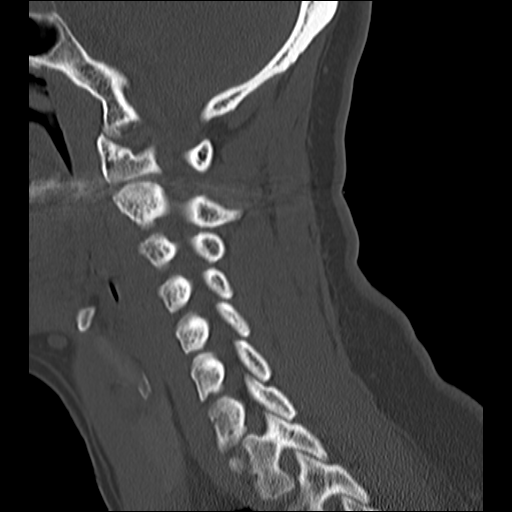
[im 20/48  bone]
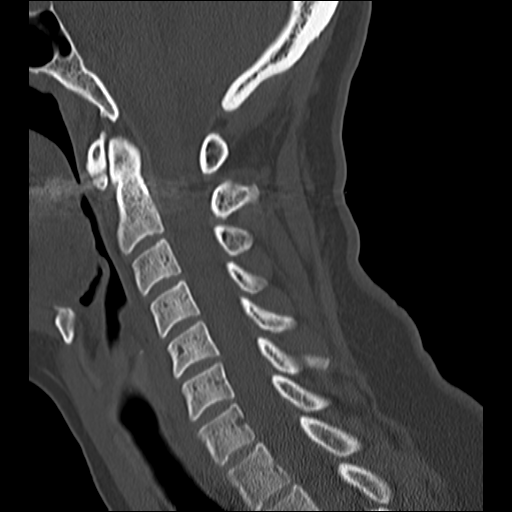
[im 24/48  soft-tissue]
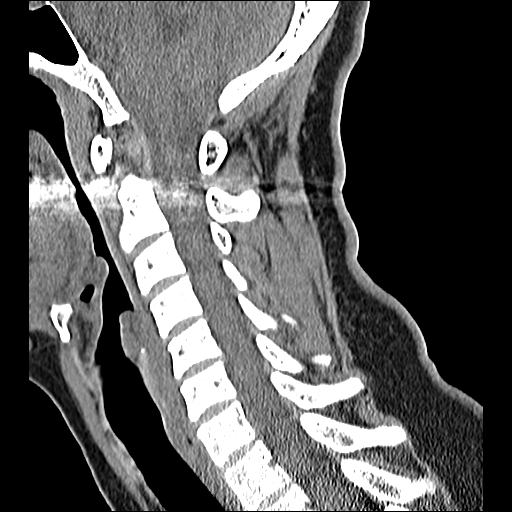
[im 24/48  bone]
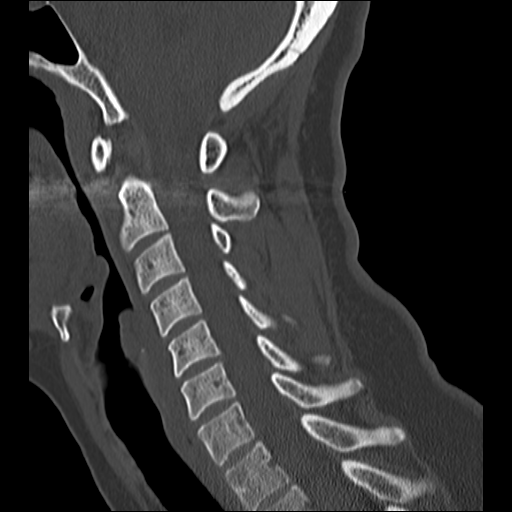
[im 28/48  bone]
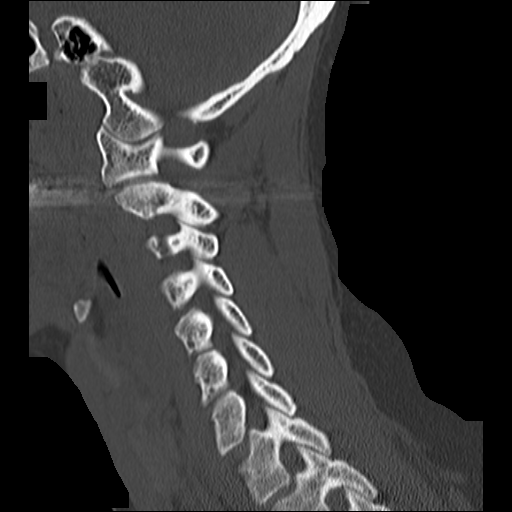
[im 32/48  bone]
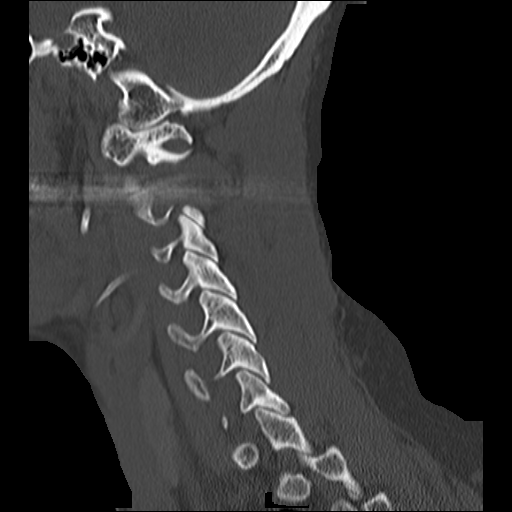

[17 of 33 positions shown; findings below may reference images not displayed]

FINDINGS: Normal cervical alignment. Negative for fracture or mass. Negative
for disc degeneration or spondylosis. No significant spinal
stenosis. Facet joints are normal.
IMPRESSION: Negative

## 2014-06-22 MED ORDER — GADOBENATE DIMEGLUMINE 529 MG/ML IV SOLN
13.0000 mL | Freq: Once | INTRAVENOUS | Status: AC | PRN
  Administered 2014-06-22: 13 mL via INTRAVENOUS

## 2014-07-18 ENCOUNTER — Ambulatory Visit: Payer: 59 | Admitting: Neurology

## 2014-07-20 ENCOUNTER — Ambulatory Visit: Payer: 59 | Admitting: Neurology

## 2014-07-23 ENCOUNTER — Ambulatory Visit (INDEPENDENT_AMBULATORY_CARE_PROVIDER_SITE_OTHER): Payer: 59 | Admitting: Neurology

## 2014-07-23 ENCOUNTER — Encounter: Payer: Self-pay | Admitting: Neurology

## 2014-07-23 ENCOUNTER — Telehealth: Payer: Self-pay | Admitting: Neurology

## 2014-07-23 VITALS — BP 140/76 | HR 110 | Ht 64.0 in | Wt 136.2 lb

## 2014-07-23 DIAGNOSIS — F444 Conversion disorder with motor symptom or deficit: Secondary | ICD-10-CM

## 2014-07-23 DIAGNOSIS — F458 Other somatoform disorders: Secondary | ICD-10-CM

## 2014-07-23 DIAGNOSIS — F322 Major depressive disorder, single episode, severe without psychotic features: Secondary | ICD-10-CM

## 2014-07-23 DIAGNOSIS — F331 Major depressive disorder, recurrent, moderate: Secondary | ICD-10-CM

## 2014-07-23 NOTE — Telephone Encounter (Signed)
Patients mother called [facility] sees Dr Candis Schatz as her psychiatrist and  Dr. Mina Marble for pain

## 2014-07-23 NOTE — Telephone Encounter (Signed)
Pt's mother, Linwood Dibbles, called. Re: calling with info as requested regarding referral. CB# [phone] / Gayleen Orem,

## 2014-07-23 NOTE — Progress Notes (Signed)
Subjective:   Megan Gonzalez was seen in consultation in the movement disorder clinic at the request of Dr. Vertell Limber.  Her PCP is AMEEN, Danella Deis, MD.  The evaluation is for tremor.  Pt is accompanied by her mother who supplements the history.  Mother actually provides most of the history as patient is unable to give me concise history that is able to be followed.   Pts mother states that tremor started in the hands one year ago.  Pt states that it began with a burning all over on one side of the body (points to the head on the L when says this) and says that she noted burning of the L hand as well but then almost immediately she began to note tremor on the R hand.  Started acutely.   Her mother states that she had a baby 3 years ago, and "her nerves and body have been deteriorating ever since then."  She doesn't remember what happened or what she was told when she saw her PCP immediately after the initial event but her mother eventually became involved with her care because the patient could no longer work, take care of her own children (her mother now does this), drive or manage herself (she dresses herself but is unable to do complex things like finances).   She went to Dr Vertell Limber and had an MRI of the brain and that was done on 06/22/14 and was negative.    Pt reports that tremor "comes and goes" when asked initially about it and then states that "it is always there."  Her mother states that she doesn't know if it is intermittent or if it comes and goes.  She is R hand dominant.    She does see psychiatry but cannot remember who.  Mother thinks that she is significantly depressed because cannot take care of her children (pt then begins to cry).  Just d/c adderrall and started new medication but they cannot remember which one.   Affected by caffeine:  Doesn't drink any Affected by alcohol:  Doesn't drink any; Denies illegal drugs Affected by stress:  No. Affected by fatigue:  No. Spills soup  if on spoon:  No. Spills glass of liquid if full:  No. Affects ADL's (tying shoes, brushing teeth, etc):  No. Doesn't work outside of the home; quit working after had baby  Current/Previously tried tremor medications: n/a  Current medications that may exacerbate tremor:  Adderall XR (just d/c)  Outside reports reviewed: historical medical records and referral letter/letters.  Allergies  Allergen Reactions  . Codeine Nausea And Vomiting  . Penicillins Other (See Comments)    Unknown- childhood reaction    Outpatient Encounter Prescriptions as of 07/23/2014  Medication Sig  . cyclobenzaprine (FLEXERIL) 10 MG tablet Take 10 mg by mouth 3 (three) times daily as needed for muscle spasms.  . Flaxseed, Linseed, (FLAX SEEDS) POWD Take 1 scoop by mouth daily as needed (digestion).  . gabapentin (NEURONTIN) 100 MG capsule Take 100 mg by mouth 2 (two) times daily.  Marland Kitchen ibuprofen (ADVIL,MOTRIN) 200 MG tablet Take 600 mg by mouth every 6 (six) hours as needed for moderate pain.  Marland Kitchen lidocaine (LIDODERM) 5 % Place 1 patch onto the skin daily. Remove & Discard patch within 12 hours or as directed by MD  . magnesium hydroxide (MILK OF MAGNESIA) 800 MG/5ML suspension Take 30 mLs by mouth daily as needed for constipation.  . ondansetron (ZOFRAN ODT) 4 MG disintegrating tablet Take 1 tablet (  4 mg total) by mouth every 8 (eight) hours as needed for nausea or vomiting.  . polyethylene glycol (MIRALAX / GLYCOLAX) packet Take 17 g by mouth daily.  Marland Kitchen senna (SENOKOT) 8.6 MG tablet Take 1 tablet by mouth daily as needed for constipation.  . [DISCONTINUED] amphetamine-dextroamphetamine (ADDERALL XR) 25 MG 24 hr capsule Take 25 mg by mouth 2 (two) times daily.  . [DISCONTINUED] buPROPion (WELLBUTRIN XL) 150 MG 24 hr tablet Take 450 mg by mouth every morning.    Past Medical History  Diagnosis Date  . Sleep apnea     CPAP-non compliant  . Headache(784.0)   . Anemia   . Depression   . Arthritis     chronic low  back pain  . Anxiety   . Cancer     Past Surgical History  Procedure Laterality Date  . Foot surgery  2006    left  . Svd      x 3  . Colonoscopy  1999  . Tonsillectomy  as child  . Pubovaginal sling  03/24/2012    Procedure: Gaynelle Arabian;  Surgeon: Ailene Rud, MD;  Location: [facility];  Service: Urology;  Laterality: N/A;  lynx with Chemical engineer   . Dilation and curettage of uterus    . Ureteral exploration  5./30/13  . Abdominal hysterectomy    . Removal of urinary sling    . Vaginal hysterectomy N/A 1/2/015    [name], Dr Linus Mako and Dr Leory Plowman, with release of Pubovag sling  . Incision and drainage abscess N/A 11/19/2013    Procedure: POSTERIOR CULPOTOMY (INCISION AND DRAINAGE OF POST OPERATIVE CUFF HEMATOMA);  Surgeon: Jonnie Kind, MD;  Location: [facility];  Service: Gynecology;  Laterality: N/A;  . Bladder neurostimulator      History   Social History  . Marital Status: Married    Spouse Name: N/A    Number of Children: N/A  . Years of Education: N/A   Occupational History  . Not on file.   Social History Main Topics  . Smoking status: Never Smoker   . Smokeless tobacco: Never Used  . Alcohol Use: No  . Drug Use: No  . Sexual Activity: Not Currently    Birth Control/ Protection: Surgical   Other Topics Concern  . Not on file   Social History Narrative  . No narrative on file    Family Status  Relation Status Death Age  . Mother Alive     healthy  . Father Alive     healthy  . Brother Alive     2, half, healthy  . Sister Alive     2, half, healthy  . Child Alive     3, healthy    Review of Systems A complete 10 system ROS was obtained and was negative apart from what is mentioned.   Objective:   VITALS:   Filed Vitals:   07/23/14 0927  BP: 140/76  Pulse: 110  Height: 5\' 4"  (1.626 m)  Weight: 136 lb 3.2 oz (61.78 kg)  SpO2: 97%   Gen:  Appears stated age and in NAD.  Tearful at times. HEENT:  Normocephalic,  atraumatic. The mucous membranes are moist. The superficial temporal arteries are without ropiness or tenderness. Cardiovascular: Regular rate and rhythm. Lungs: Clear to auscultation bilaterally. Neck: There are no carotid bruits noted bilaterally.  NEUROLOGICAL:  Orientation:  The patient is alert.  Very flat affect.  Difficult to get to follow commands.  Doesn't attempt  to answer orientation commands (loses "track" of question asked and then repetivitely says "what?").  She is asked to name months backward, and misses 2 in the sequence, but otherwise gets it correct.  Unfocused.   Cranial nerves: There is good facial symmetry. The pupils are equal round and reactive to light bilaterally. Fundoscopic exam reveals clear disc margins bilaterally. Extraocular muscles are intact and visual fields are full to confrontational testing. Speech is fluent and clear. Soft palate rises symmetrically and there is no tongue deviation. Hearing is intact to conversational tone. Tone: Tone is good throughout. Sensation: Sensation is intact to light touch and pinprick throughout (facial, trunk, extremities). Vibration is intact at the bilateral big toe. There is no extinction with double simultaneous stimulation. There is no sensory dermatomal level identified. Coordination:  The patient has no dysdiadichokinesia or dysmetria. Motor: Strength is at least 4+ to 5-/5 throughout but there is give way weakness that improves with encouragement.  No lateralizing weakness.  No pronator drift.   DTR's: Deep tendon reflexes are 2+/4 at the bilateral biceps, triceps, brachioradialis, patella and achilles.  Plantar responses are downgoing bilaterally. Gait and Station: The patient is initially asked to arise out of the seat without the use of her hands.  She appears confused about the task and moves back and forth in the chair, with virtually no attempt to get up.  Then, she was told that she could use her hands and she pushes  off with her hands, but leans her entire body backward.  She is able to get out of the chair.  Her mother gives her a cane and she puts that in the right hand.  She stoops over when she walks and is slow and tenuous.  She does not shuffle.  MOVEMENT EXAM: Tremor:  There is a right upper extremity resting tremor that varies in frequency and amplitude.  When distracted, it is virtually gone.  When asked to name the months backwards, there is no tremor at all.  When asked to perform various commands of rapid alternating movements, the tremor either goes away or matches the frequency at which she was asked to perform the command.  When asked to pour water from one glass to another, she leans over the sink and becomes very tearful.  She is so slow that she is unable to pour the water from one glass to another after several minutes and the task is stopped as she is very disturbed.  She does spill some.    An MRI of the brain was performed with and without gadolinium on 06/22/2014.  I reviewed this with the patient and her mother.  This was normal.   Assessment/Plan:   1. Tremor.  -I suspect that this is psychogenic tremor.  I see no evidence of a neurodegenerative process.  I had a long discussion with the patient and her mother today.  We talked about treatments for psychogenic tremor.  I think that depression is playing a strong role, and her mother agrees.  I talked to her about the role of cognitive behavioral therapy.  She asked me for a referral to a counselor, which I would be happy to provide, but it turns out she was already seeing a psychiatrist.  Her mother was not happy with whoever she was seeing and wanted to transfer care, but they could not tell me exactly who she was seeing.  She will call me tomorrow and let me know and I will then provide a referral  if I can.  Discussed with patient and mother that this can be very treatment resisitant and involves a lot of work on the patients part.  Much  greater than 50% of 60 min visit in counseling in this regard.

## 2014-07-24 NOTE — Telephone Encounter (Signed)
Referral sent to Dr Valentina Shaggy. They will contact patient with appt time and date.

## 2014-07-24 NOTE — Telephone Encounter (Signed)
Luvenia Starch, will you refer to Dr. Valentina Shaggy for severe depression and psychogenic tremor

## 2014-08-09 ENCOUNTER — Ambulatory Visit (INDEPENDENT_AMBULATORY_CARE_PROVIDER_SITE_OTHER): Payer: 59 | Admitting: Internal Medicine

## 2014-08-09 ENCOUNTER — Encounter: Payer: Self-pay | Admitting: Internal Medicine

## 2014-08-09 VITALS — BP 124/90 | HR 88 | Temp 97.8°F | Resp 16 | Ht 62.0 in | Wt 147.4 lb

## 2014-08-09 DIAGNOSIS — R102 Pelvic and perineal pain unspecified side: Secondary | ICD-10-CM

## 2014-08-09 DIAGNOSIS — R0789 Other chest pain: Secondary | ICD-10-CM

## 2014-08-09 DIAGNOSIS — M545 Low back pain: Secondary | ICD-10-CM

## 2014-08-09 DIAGNOSIS — M629 Disorder of muscle, unspecified: Secondary | ICD-10-CM

## 2014-08-09 DIAGNOSIS — Z0000 Encounter for general adult medical examination without abnormal findings: Secondary | ICD-10-CM

## 2014-08-09 DIAGNOSIS — K5900 Constipation, unspecified: Secondary | ICD-10-CM

## 2014-08-09 DIAGNOSIS — R29898 Other symptoms and signs involving the musculoskeletal system: Secondary | ICD-10-CM

## 2014-08-09 NOTE — Patient Instructions (Addendum)
We will work on finding another doctor for you to try for the back and for the urogynecologist. Remember that a doctor can recommend surgery but cannot make you have surgery if you do not want to.  Orthopedic surgeon to try: [facility] [address] [place], [place] [address] [phone]  Come back to check in with [place] in about 5-6 months to make sure the blood pressure is normal.   Call us with questions or problems.

## 2014-08-09 NOTE — Assessment & Plan Note (Signed)
Doing well with on linzess however she is now having loose bowel movements 2-3 times per day. No blood in the bowel movements. I think that it is better for her to have 2-3 diarrheal movements per day then to be chronically constipated and have recommended that she followup with her GI doctor. Since she is now able to move her bowels she may not need colostomy bag. She is very adamant that she does not want this.

## 2014-08-09 NOTE — Progress Notes (Signed)
Pre visit review using our clinic review tool, if applicable. No additional management support is needed unless otherwise documented below in the visit note. 

## 2014-08-09 NOTE — Progress Notes (Signed)
   Subjective:    Patient ID: Megan Gonzalez, female    DOB: 01-Mar-1971, 43 y.o.   MRN: [id number]  HPI The patient is a 43 year old female comes in today to establish care. She has complicated past medical history including complications of several pelvic surgeries after vaginal hysterectomy. She has had previous pelvic sling which was removed recently and currently she is dealing with muscular problems and being unable to move her bowels due to lack of muscle. She is also having some disc problems in her lumbar region which her orthopedic surgeon thinks she probably needs to have operation for. She is currently seeing GI as she is very constipated generally and has been placed on linzess which has helped her to have bowel movements however they're now diarrheal in nature. She is only having 2-3 daily and she denies any blood in the bowel movements. She denies fevers, chills, abdominal pain. She does have back pain and she is very dissatisfied with her current state of living. She is not clear that her doctors are currently doing the most that they can for her and she is also upset that her GI doctor, Dr. Benson Norway, has recommended that she may need colectomy if she is unable to pass her stools with her muscular dysfunction. She thinks she is way too young for this. She also has a lot of problems with lack of feeling in her pelvic region and she is not happy with this as well.  Review of Systems  Constitutional: Positive for activity change. Negative for fever, appetite change, fatigue and unexpected weight change.  HENT: Negative.   Eyes: Negative.   Respiratory: Negative for cough, chest tightness, shortness of breath and wheezing.   Cardiovascular: Negative for chest pain, palpitations and leg swelling.  Gastrointestinal: Positive for diarrhea and constipation. Negative for nausea, vomiting, abdominal pain and abdominal distention.  Genitourinary: Positive for pelvic pain.  Musculoskeletal: Positive  for arthralgias, back pain and myalgias. Negative for gait problem.  Skin: Negative.   Neurological: Positive for weakness and numbness. Negative for dizziness, light-headedness and headaches.  Psychiatric/Behavioral: Positive for dysphoric mood.      Objective:   Physical Exam  Constitutional: She is oriented to person, place, and time. She appears well-developed and well-nourished.  HENT:  Head: Normocephalic and atraumatic.  Eyes: EOM are normal.  Neck: Normal range of motion.  Cardiovascular: Normal rate and regular rhythm.   Pulmonary/Chest: Effort normal and breath sounds normal.  Abdominal: Soft. Bowel sounds are normal. She exhibits no distension. There is no tenderness. There is no rebound.  Neurological: She is alert and oriented to person, place, and time. Coordination normal.  Skin: Skin is warm and dry.   Filed Vitals:   08/09/14 0954  BP: 124/90  Pulse: 88  Temp: 97.8 F (36.6 C)  TempSrc: Oral  Resp: 16  Height: 5\' 2"  (1.575 m)  Weight: 147 lb 6.4 oz (66.86 kg)  SpO2: 97%      Assessment & Plan:  Patient declines flu shot.

## 2014-08-09 NOTE — Assessment & Plan Note (Addendum)
Stress test earlier this year negative in [place] and she states she is not currently having the chest pains.

## 2014-08-09 NOTE — Assessment & Plan Note (Signed)
Patient continues to go to [facility] for her care however she is very unsatisfied currently with their management of her problems. Have made referral for urogynecology at [facility] and she is free to get a second opinion from them regarding her complicated medical history.

## 2014-08-09 NOTE — Assessment & Plan Note (Signed)
She has seen orthopedic surgeon in town who recently did an injection in her back. She is supposed to see them back tomorrow. She is concerned that they will recommend surgery and given that she's had several surgeries which she feels has made her worse rather than better she is unclear if she wants surgery. I did remind her that no one can force her to have surgery and can only make a recommendation about care. If she is not feeling like she is getting the answers from this doctor she can always get a second opinion about her back.

## 2014-08-09 NOTE — Assessment & Plan Note (Addendum)
Patient declines flu shot currently, not candidate for Pap smear. Also declines tetanus shot. BP normotensive today.

## 2014-08-22 ENCOUNTER — Telehealth: Payer: Self-pay | Admitting: Internal Medicine

## 2014-08-22 NOTE — Telephone Encounter (Signed)
Spoke w/pt regarding her referral to [facility] urogynecology b/c their office stated they had tried to call pt and pt had not called them back. Pt states that she has been hesitant bc she does not want [facility] to think that [facility] has treated her poorly. She states that she is not unsatisfied, but she would like to know if [facility] has any additional treatment options that [facility] is not able to offer her. She is requesting that the information regarding her dissatisfaction with [facility] be removed from her records.

## 2014-08-27 ENCOUNTER — Encounter: Payer: Self-pay | Admitting: Internal Medicine

## 2014-09-19 ENCOUNTER — Encounter: Payer: Self-pay | Admitting: Internal Medicine

## 2014-09-19 ENCOUNTER — Telehealth: Payer: Self-pay | Admitting: Internal Medicine

## 2014-09-19 ENCOUNTER — Ambulatory Visit (INDEPENDENT_AMBULATORY_CARE_PROVIDER_SITE_OTHER)
Admission: RE | Admit: 2014-09-19 | Discharge: 2014-09-19 | Disposition: A | Discharge status: Home / Self Care | Payer: 59 | Source: Ambulatory Visit | Attending: Internal Medicine | Admitting: Internal Medicine

## 2014-09-19 ENCOUNTER — Ambulatory Visit (INDEPENDENT_AMBULATORY_CARE_PROVIDER_SITE_OTHER): Payer: 59 | Admitting: Internal Medicine

## 2014-09-19 ENCOUNTER — Other Ambulatory Visit (INDEPENDENT_AMBULATORY_CARE_PROVIDER_SITE_OTHER): Payer: 59

## 2014-09-19 VITALS — BP 130/90 | HR 98 | Temp 98.7°F | Resp 13 | Wt 148.0 lb

## 2014-09-19 DIAGNOSIS — J209 Acute bronchitis, unspecified: Secondary | ICD-10-CM

## 2014-09-19 DIAGNOSIS — J310 Chronic rhinitis: Secondary | ICD-10-CM

## 2014-09-19 LAB — CBC WITH DIFFERENTIAL/PLATELET
BASOS ABS: 0 10*3/uL (ref 0.0–0.1)
Basophils Relative: 0.5 % (ref 0.0–3.0)
EOS ABS: 0.2 10*3/uL (ref 0.0–0.7)
Eosinophils Relative: 3.9 % (ref 0.0–5.0)
HEMATOCRIT: 37.5 % (ref 36.0–46.0)
Hemoglobin: 12.3 g/dL (ref 12.0–15.0)
LYMPHS ABS: 1.4 10*3/uL (ref 0.7–4.0)
Lymphocytes Relative: 23.7 % (ref 12.0–46.0)
MCHC: 32.8 g/dL (ref 30.0–36.0)
MCV: 80.4 fl (ref 78.0–100.0)
Monocytes Absolute: 0.5 10*3/uL (ref 0.1–1.0)
Monocytes Relative: 9.1 % (ref 3.0–12.0)
Neutro Abs: 3.6 10*3/uL (ref 1.4–7.7)
Neutrophils Relative %: 62.8 % (ref 43.0–77.0)
PLATELETS: 274 10*3/uL (ref 150.0–400.0)
RBC: 4.66 Mil/uL (ref 3.87–5.11)
RDW: 14.3 % (ref 11.5–15.5)
WBC: 5.8 10*3/uL (ref 4.0–10.5)

## 2014-09-19 MED ORDER — AZITHROMYCIN 250 MG PO TABS: ORAL_TABLET | ORAL | Status: DC

## 2014-09-19 MED ORDER — BENZONATATE 200 MG PO CAPS
200.0000 mg | ORAL_CAPSULE | Freq: Three times a day (TID) | ORAL | Status: DC | PRN
Start: 2014-09-19 — End: 2014-11-22

## 2014-09-19 MED ORDER — PREDNISONE 20 MG PO TABS: 20.0000 mg | ORAL_TABLET | Freq: Two times a day (BID) | ORAL | Status: DC

## 2014-09-19 NOTE — Progress Notes (Signed)
Pre visit review using our clinic review tool, if applicable. No additional management support is needed unless otherwise documented below in the visit note. 

## 2014-09-19 NOTE — Telephone Encounter (Signed)
Is requesting to transfer from [facility] to [place].  Please advise.

## 2014-09-19 NOTE — Progress Notes (Signed)
   Subjective:    Patient ID: Megan Gonzalez, female    DOB: 09-Aug-1971, 43 y.o.   MRN: [id number]  HPI   Her symptoms have been present for 3 weeks; she describes constant discomfort in her throat and tightness in her chest.  She's been coughing up brown/green secretions.  She describes being chilled with sweats. She has pain in the right frontal sinus area and over the right crown anteriorly. She describes rhinitis with clear drainage. She also has sore throat.  She describes some discomfort with deep breathing and shortness of breath. She's had associated sneezing.  She's never smoked. She has no history of asthma.    Review of Systems  Facial pain , nasal purulence, dental pain,  otic pain or otic discharge denied. No fever .     Objective:   Physical Exam   Positive or pertinent findings include: Despite absence of fever; she exhibits rigor. There is minimal erythema of the oropharynx without exudate. There's lace like scarring of the right tympanic membrane.  General appearance:adequately nourished; no acute distress or increased work of breathing is present.  No  lymphadenopathy about the head, neck, or axilla noted.   Eyes: No conjunctival inflammation or lid edema is present. There is no scleral icterus.  Ears:  External ear exam shows no significant lesions or deformities.  Otoscopic examination reveals clear canals, tympanic membranes are intact bilaterally without bulging, retraction, inflammation or discharge.  Nose:  External nasal examination shows no deformity or inflammation. Nasal mucosa are pink and moist without lesions or exudates. No septal dislocation or deviation.No obstruction to airflow.   Oral exam: Dental hygiene is good; lips and gums are healthy appearing.  Neck:  No deformities, thyromegaly, masses, or tenderness noted.     Heart:  Repeat pulse 89.Normal rate and regular rhythm. S1 and S2 normal without gallop, murmur, click, rub or other  extra sounds.   Lungs:Chest clear to auscultation; no wheezes, rhonchi,rales ,or rubs present.No increased work of breathing.    Extremities:  No cyanosis, edema, or clubbing  noted    Skin: Warm & dry w/o tenting.         Assessment & Plan:  #1 acute bronchitis w/o bronchospasm #2 URI, acute Plan: See orders and recommendations

## 2014-09-19 NOTE — Patient Instructions (Addendum)
Your next office appointment will be determined based upon review of your pending labs & x-rays. Those instructions will be transmitted to you through My Chart    Followup as needed for your acute issue. Please report any significant change in your symptoms.  lain Mucinex (NOT D) for thick secretions ;force NON dairy fluids .   Nasal cleansing in the shower as discussed with lather of mild shampoo.After 10 seconds wash off lather while  exhaling through nostrils. Make sure that all residual soap is removed to prevent irritation.  Flonase OR Nasacort AQ 1 spray in each nostril twice a day as needed. Use the "crossover" technique into opposite nostril spraying toward opposite ear @ 45 degree angle, not straight up into nostril.  Use a Neti pot daily only  as needed for significant sinus congestion; going from open side to congested side . Plain Allegra (NOT D )  160 daily , Loratidine 10 mg , OR Zyrtec 10 mg @ bedtime  as needed for itchy eyes & sneezing.

## 2014-09-25 NOTE — Telephone Encounter (Signed)
Fine with me

## 2014-09-28 ENCOUNTER — Telehealth: Payer: Self-pay | Admitting: Internal Medicine

## 2014-09-28 NOTE — Telephone Encounter (Signed)
Received medical records, 47 pages, from [facility], sent to Dr. Linna Darner. 09/28/14/ss.

## 2014-10-01 ENCOUNTER — Other Ambulatory Visit (INDEPENDENT_AMBULATORY_CARE_PROVIDER_SITE_OTHER): Payer: 59

## 2014-10-01 ENCOUNTER — Ambulatory Visit (INDEPENDENT_AMBULATORY_CARE_PROVIDER_SITE_OTHER): Payer: 59 | Admitting: Internal Medicine

## 2014-10-01 ENCOUNTER — Encounter: Payer: Self-pay | Admitting: Internal Medicine

## 2014-10-01 VITALS — BP 140/90 | HR 100 | Temp 98.5°F | Wt 143.1 lb

## 2014-10-01 DIAGNOSIS — Z8659 Personal history of other mental and behavioral disorders: Secondary | ICD-10-CM

## 2014-10-01 DIAGNOSIS — IMO0001 Reserved for inherently not codable concepts without codable children: Secondary | ICD-10-CM

## 2014-10-01 DIAGNOSIS — R0600 Dyspnea, unspecified: Secondary | ICD-10-CM

## 2014-10-01 DIAGNOSIS — R0789 Other chest pain: Secondary | ICD-10-CM

## 2014-10-01 DIAGNOSIS — N644 Mastodynia: Secondary | ICD-10-CM

## 2014-10-01 LAB — D-DIMER, QUANTITATIVE: D-Dimer, Quant: 1.22 ug/mL-FEU — ABNORMAL HIGH (ref 0.00–0.48)

## 2014-10-01 LAB — TROPONIN I: TNIDX: 0.01 ug/l (ref 0.00–0.06)

## 2014-10-01 NOTE — Patient Instructions (Addendum)
Your next office appointment will be determined based upon review of your pending labs . Those instructions will be transmitted to you  by mail Followup as needed for your acute issue. Please report any significant change in your symptoms. Caffeine ( coffee, tea, cola, chocolate ) can increase fibrocystic changes. Avoid thse to excess as much as  possible. Consider vitamin E 400 international units daily if having active symptoms. Share the EKG to all doctors seen. There are nonspecific changes; as long as there is no new change these are not clinically significant

## 2014-10-01 NOTE — Progress Notes (Signed)
Pre visit review using our clinic review tool, if applicable. No additional management support is needed unless otherwise documented below in the visit note. 

## 2014-10-01 NOTE — Progress Notes (Signed)
   Subjective:    Patient ID: Megan Gonzalez, female    DOB: [birth year], 43 y.o.   MRN: [id number]  HPI  She is here with multiple complaints.  She describes shortness of breath rest; she is unsure how long this has been present. She has no history of asthma and has never smoked  She also describes pain in the left breast for 2 weeks. She's had pressure on her chest as well. She states that the pressure and pain in the chest radiate posteriorly. Chest Xray last month revealed no aortic changes.  No breast discharge or bleeding.She had a mammogram in August which was normal.  She drinks 2 cups of tea a day but denies other intake of caffeine.  She had an appointment with a heart specialist in [facility] but this was canceled by the cardiologist  She has had a nonproductive cough for 6 weeks. She describes ongoing fever, chills, or sweats she has completed a course of Zithromax.  She is on Brintellix from Dr. Candis Schatz, Psychiatry.    Review of Systems    She must sit on a cushion for some pelvic issues  She describes neurogenic bladder. The Problem List mentions bladder prolapse  She's had no associated sputum production or hemoptysis.      Objective:   Physical Exam   Positive or pertinent findings include: She is avery poor historian. She ambulates with a cane and has dramatic forward extension of her lower extremities as she walks. She has pain in the abdomen as I percussed for organomegaly There is an intention tremor right upper extremity as she handed her mother her cane. No abnormality of breast noted.  General appearance :adequately nourished; in no distress but actions & reactions very dramatic in character. Eyes: No conjunctival inflammation or scleral icterus is present. Oral exam:  Lips and gums are healthy appearing.There is no oropharyngeal erythema or exudate noted.  Heart:  Normal rate and regular rhythm.Pulse recheck 90. S1 and S2 normal without  gallop, murmur, click, rub or other extra sounds   Lungs:Chest clear to auscultation; no wheezes, rhonchi,rales ,or rubs present.No increased work of breathing.  Abdomen: bowel sounds normal, soft and non-tender without masses, organomegaly or hernias noted.  No guarding or rebound. No AAA or bruit. Vascular : all pulses equal ; no bruits present. Homan's negative. Skin:Warm & dry.  Intact without suspicious lesions or rashes ; no tenting Lymphatic: No lymphadenopathy is noted about the head, neck, axilla            Assessment & Plan:  #1 chest pain #2 dyspnea; negative recent Xray #3 breast pain See orders

## 2014-10-02 ENCOUNTER — Other Ambulatory Visit: Payer: Self-pay | Admitting: Internal Medicine

## 2014-10-02 ENCOUNTER — Ambulatory Visit (INDEPENDENT_AMBULATORY_CARE_PROVIDER_SITE_OTHER)
Admission: RE | Admit: 2014-10-02 | Discharge: 2014-10-02 | Disposition: A | Discharge status: Home / Self Care | Payer: 59 | Source: Ambulatory Visit | Attending: Internal Medicine | Admitting: Internal Medicine

## 2014-10-02 ENCOUNTER — Telehealth: Payer: Self-pay

## 2014-10-02 DIAGNOSIS — R0789 Other chest pain: Secondary | ICD-10-CM

## 2014-10-02 DIAGNOSIS — R0600 Dyspnea, unspecified: Secondary | ICD-10-CM

## 2014-10-02 DIAGNOSIS — Z01818 Encounter for other preprocedural examination: Secondary | ICD-10-CM

## 2014-10-02 DIAGNOSIS — R7989 Other specified abnormal findings of blood chemistry: Secondary | ICD-10-CM

## 2014-10-02 DIAGNOSIS — R791 Abnormal coagulation profile: Secondary | ICD-10-CM

## 2014-10-02 DIAGNOSIS — IMO0001 Reserved for inherently not codable concepts without codable children: Secondary | ICD-10-CM | POA: Insufficient documentation

## 2014-10-02 MED ORDER — IOHEXOL 350 MG/ML SOLN: 80.0000 mL | Freq: Once | INTRAVENOUS | Status: AC | PRN

## 2014-10-02 NOTE — Telephone Encounter (Signed)
Phone call from [place] with [facility]. Stat PE study was done on patient this morning. Exam verbally is negative. Report not in system yet due to [place]. Should be in system some time this afternoon.

## 2014-10-02 NOTE — Progress Notes (Signed)
Spoke with patient and she did get a chance to review her results through [place]. I informed her that someone will be calling her with an appointment for her cat scan.

## 2014-10-02 NOTE — Telephone Encounter (Signed)
   Preliminary report is there is no evidence of blood clots.  The d-dimer is a screening test, not a diagnostic test. It can be false positive as appears to be in this case  She should continue her follow-up for her chest pain with the cardiologist.

## 2014-10-25 ENCOUNTER — Encounter: Payer: Self-pay | Admitting: Internal Medicine

## 2014-10-25 ENCOUNTER — Ambulatory Visit (INDEPENDENT_AMBULATORY_CARE_PROVIDER_SITE_OTHER): Payer: 59 | Admitting: Internal Medicine

## 2014-10-25 ENCOUNTER — Ambulatory Visit (HOSPITAL_COMMUNITY): Payer: 59 | Attending: Cardiovascular Disease | Admitting: *Deleted

## 2014-10-25 VITALS — BP 110/72 | HR 82 | Temp 97.8°F | Resp 12 | Ht 62.0 in | Wt 150.8 lb

## 2014-10-25 DIAGNOSIS — M79662 Pain in left lower leg: Secondary | ICD-10-CM

## 2014-10-25 DIAGNOSIS — R0789 Other chest pain: Secondary | ICD-10-CM

## 2014-10-25 DIAGNOSIS — M79605 Pain in left leg: Secondary | ICD-10-CM

## 2014-10-25 NOTE — Progress Notes (Signed)
   Subjective:    Patient ID: Megan Gonzalez, female    DOB: Jul 13, 1971, 43 y.o.   MRN: [id number]  HPI The patient is a 43 YO female who comes in today for follow up of her atypical chest pain. She had negative CT chest recently and negative troponin. She was taking prednisone and did not notice a difference. She denies congestion at this time. She is now having some leg pain in the last week or so. She has not noticed swelling but the pain is posterior to her kneecap as well as in the calf of her left leg. No changes in activity or travel. She did have an elevated d-dimer last month. No fevers, rash, chills. Her mother is with her and wants to know why I wrote something negative in her last note. I read to her the note which states "she was unsatisfied with her current care and wanted a second opinion". She was not happy with this wording and states that they feel they have received worse care at the second place due to this wording.   Review of Systems  Constitutional: Negative for fever, activity change, appetite change, fatigue and unexpected weight change.  HENT: Negative.   Respiratory: Positive for chest tightness. Negative for cough, shortness of breath and wheezing.   Cardiovascular: Positive for chest pain. Negative for palpitations and leg swelling.  Gastrointestinal: Positive for abdominal pain. Negative for diarrhea, constipation and abdominal distention.  Musculoskeletal: Positive for myalgias. Negative for back pain, arthralgias and gait problem.  Neurological: Negative for dizziness, syncope, weakness, light-headedness and headaches.      Objective:   Physical Exam  Constitutional: She is oriented to person, place, and time. She appears well-developed and well-nourished. No distress.  HENT:  Head: Normocephalic and atraumatic.  Eyes: EOM are normal.  Neck: Normal range of motion.  Cardiovascular: Normal rate and regular rhythm.   Pulmonary/Chest: Effort normal and breath  sounds normal. No respiratory distress. She has no wheezes. She has no rales. She exhibits no tenderness.  Abdominal: Soft. Bowel sounds are normal. She exhibits no distension.  Musculoskeletal: She exhibits tenderness.  Tenderness posterior knee radiating into the calf. Calf is also tender to the touch but no swelling in that leg in comparison to the other.   Neurological: She is alert and oriented to person, place, and time.  Skin: Skin is warm and dry. No rash noted. No erythema.   Filed Vitals:   10/25/14 1109  BP: 110/72  Pulse: 82  Temp: 97.8 F (36.6 C)  TempSrc: Oral  Resp: 12  Height: 5\' 2"  (1.575 m)  Weight: 150 lb 12.8 oz (68.402 kg)  SpO2: 97%      Assessment & Plan:

## 2014-10-25 NOTE — Progress Notes (Signed)
Pre visit review using our clinic review tool, if applicable. No additional management support is needed unless otherwise documented below in the visit note. 

## 2014-10-25 NOTE — Progress Notes (Signed)
Lower Extremity Venous Duplex - Limited - left lower extremity negative for DVT

## 2014-10-25 NOTE — Assessment & Plan Note (Signed)
Negative troponin and CT chest at last visit. Will place another referral to cardiology as she has not been scheduled yet. Stable.

## 2014-10-25 NOTE — Assessment & Plan Note (Signed)
Given her elevated D-dimer last visit will check ultrasound to rule out DVT. She does not have any swelling in the calf but does have pain in the calf as well as posterior to the kneecap. Possibly baker's cyst given tenderness behind knee. Advised NSAIDs, ice, lidoderm patch. If DVT positive will need treatment. Ordered imaging stat.

## 2014-10-25 NOTE — Patient Instructions (Signed)
We will refer you to a cardiologist in town.   We will get an ultrasound of your left leg to make sure you do not have a blood clot. We recommend using the lidoderm (lidocaine) patch on your leg for the pain. The other recommendation is ice on your leg. Use ice for 20 minutes on then remove for 20 minutes. Do this at least 3-4 times per day. Try to elevate your leg and stay off it as much as possible.   Baker Cyst A Baker cyst is a sac-like structure that forms in the back of the knee. It is filled with the same fluid that is located in your knee. This fluid lubricates the bones and cartilage of the knee and allows them to move over each other more easily. CAUSES  When the knee becomes injured or inflamed, increased fluid forms in the knee. When this happens, the joint lining is pushed out behind the knee and forms the Baker cyst. This cyst may also be caused by inflammation from arthritic conditions and infections. SIGNS AND SYMPTOMS  A Baker cyst usually has no symptoms. When the cyst is substantially enlarged:  You may feel pressure behind the knee, stiffness in the knee, or a mass in the area behind the knee.  You may develop pain, redness, and swelling in the calf. This can suggest a blood clot and requires evaluation by your health care provider. DIAGNOSIS  A Baker cyst is most often found during an ultrasound exam. This exam may have been performed for other reasons, and the cyst was found incidentally. Sometimes an MRI is used. This picks up other problems within a joint that an ultrasound exam may not. If the Baker cyst developed immediately after an injury, X-ray exams may be used to diagnose the cyst. TREATMENT  The treatment depends on the cause of the cyst. Anti-inflammatory medicines and rest often will be prescribed. If the cyst is caused by a bacterial infection, antibiotic medicines may be prescribed.  HOME CARE INSTRUCTIONS   If the cyst was caused by an injury, for the first  24 hours, keep the injured leg elevated on 2 pillows while lying down.  For the first 24 hours while you are awake, apply ice to the injured area:  Put ice in a plastic bag.  Place a towel between your skin and the bag.  Leave the ice on for 20 minutes, 2-3 times a day.  Only take over-the-counter or prescription medicines for pain, discomfort, or fever as directed by your health care provider.  Only take antibiotic medicine as directed. Make sure to finish it even if you start to feel better. MAKE SURE YOU:   Understand these instructions.  Will watch your condition.  Will get help right away if you are not doing well or get worse. Document Released: 10/12/2005 Document Revised: 08/02/2013 Document Reviewed: 05/24/2013 [facility] Patient Information 2015 [place], [place]. This information is not intended to replace advice given to you by your health care provider. Make sure you discuss any questions you have with your health care provider.

## 2014-10-30 ENCOUNTER — Encounter: Payer: Self-pay | Admitting: Internal Medicine

## 2014-11-01 ENCOUNTER — Encounter: Payer: Self-pay | Admitting: Internal Medicine

## 2014-11-21 ENCOUNTER — Ambulatory Visit: Payer: Self-pay | Admitting: Cardiology

## 2014-11-22 ENCOUNTER — Ambulatory Visit (INDEPENDENT_AMBULATORY_CARE_PROVIDER_SITE_OTHER): Payer: 59 | Admitting: Interventional Cardiology

## 2014-11-22 ENCOUNTER — Ambulatory Visit: Payer: Self-pay | Admitting: Interventional Cardiology

## 2014-11-22 ENCOUNTER — Encounter: Payer: Self-pay | Admitting: Interventional Cardiology

## 2014-11-22 VITALS — BP 142/100 | HR 79 | Ht 62.0 in | Wt 157.0 lb

## 2014-11-22 DIAGNOSIS — R0789 Other chest pain: Secondary | ICD-10-CM

## 2014-11-22 NOTE — Patient Instructions (Signed)
Your physician recommends that you schedule a follow-up appointment as needed with Dr. Varanasi.  

## 2014-11-22 NOTE — Progress Notes (Signed)
Patient ID: Megan Gonzalez, female   DOB: [birth year], 44 y.o.   MRN: [id number]     Patient ID: Megan Gonzalez MRN: [id number] DOB/AGE: 1971-03-11 44 y.o.   Referring Physician  Dr. Doug Sou   Reason for Consultation chest pain  HPI: 44 y/o who has had chest pain and SHOB.  It can be a sharp pain or a burning feeling.  It never goes away completely.  She has issues with her bladder and SI joint.  She cannot do much exercise due to these issues.  She saw Dr. Linna Darner in Dec 2015 and had a referral made here.  Pain has been intermittent since 2013.  It has been increasing in intensity.  Not related to exercise.  She does not really exercise.  She does feel that it is worse with heavy movements.  She gets out of breath quickly.  She feels sweating and hot flashes.    Father with heart disease.    Patient does not smoke.     Current Outpatient Prescriptions  Medication Sig Dispense Refill  . BRINTELLIX 10 MG TABS Take by mouth daily. One daily    . cyclobenzaprine (FLEXERIL) 10 MG tablet Take 10 mg by mouth 3 (three) times daily as needed for muscle spasms.    Marland Kitchen gabapentin (NEURONTIN) 100 MG capsule Take 100 mg by mouth 2 (two) times daily.    Marland Kitchen ibuprofen (ADVIL,MOTRIN) 200 MG tablet Take 600 mg by mouth every 6 (six) hours as needed for moderate pain.    Marland Kitchen lidocaine (LIDODERM) 5 % Place 1 patch onto the skin daily. Remove & Discard patch within 12 hours or as directed by MD    . Rolan Lipa 145 MCG CAPS capsule Take 145 mcg by mouth daily.     . methocarbamol (ROBAXIN) 750 MG tablet Take 750 mg by mouth 3 (three) times daily.     . ondansetron (ZOFRAN ODT) 4 MG disintegrating tablet Take 1 tablet (4 mg total) by mouth every 8 (eight) hours as needed for nausea or vomiting. 10 tablet 0   No current facility-administered medications for this visit.   Past Medical History  Diagnosis Date  . Sleep apnea     CPAP-non compliant  . Headache(784.0)   . Anemia   . Depression   . Arthritis      chronic low back pain  . Anxiety   . Cancer     Family History  Problem Relation Age of Onset  . Anesthesia problems Neg Hx   . Heart disease Father     History   Social History  . Marital Status: Married    Spouse Name: N/A    Number of Children: N/A  . Years of Education: N/A   Occupational History  . Not on file.   Social History Main Topics  . Smoking status: Never Smoker   . Smokeless tobacco: Never Used  . Alcohol Use: No  . Drug Use: No  . Sexual Activity: Not Currently    Birth Control/ Protection: Surgical   Other Topics Concern  . Not on file   Social History Narrative    Past Surgical History  Procedure Laterality Date  . Foot surgery  2006    left  . Svd      x 3  . Colonoscopy  1999  . Tonsillectomy  as child  . Pubovaginal sling  03/24/2012    Procedure: Gaynelle Arabian;  Surgeon: Ailene Rud, MD;  Location: [facility];  Service: Urology;  Laterality:  N/A;  lynx with [employer]   . Dilation and curettage of uterus    . Ureteral exploration  5./30/13  . Abdominal hysterectomy    . Removal of urinary sling    . Vaginal hysterectomy N/A 1/2/015    [name], Dr Linus Mako and Dr Leory Plowman, with release of Pubovag sling  . Incision and drainage abscess N/A 11/19/2013    Procedure: POSTERIOR CULPOTOMY (INCISION AND DRAINAGE OF POST OPERATIVE CUFF HEMATOMA);  Surgeon: Jonnie Kind, MD;  Location: [facility];  Service: Gynecology;  Laterality: N/A;  . Bladder neurostimulator        (Not in a hospital admission)  Review of systems complete and found to be negative unless listed above .  No nausea, vomiting.  No fever chills, No focal weakness,  No palpitations.  Physical Exam: Filed Vitals:   11/22/14 1100  BP: 142/100  Pulse: 79    Weight: 157 lb (71.215 kg)  Physical exam: Appears generally uncomfortable- difficulty sitting still [place]/AT EOMI No JVD, No carotid bruit RRR S1S2  No wheezing Soft. NT, nondistended No  edema. No focal motor or sensory deficits Normal affect  Labs:   Lab Results  Component Value Date   WBC 5.8 09/19/2014   HGB 12.3 09/19/2014   HCT 37.5 09/19/2014   MCV 80.4 09/19/2014   PLT 274.0 09/19/2014   No results for input(s): NA, K, CL, CO2, BUN, CREATININE, CALCIUM, PROT, BILITOT, ALKPHOS, ALT, AST, GLUCOSE in the last 168 hours.  Invalid input(s): LABALBU No results found for: CKTOTAL, CKMB, CKMBINDEX, TROPONINI No results found for: CHOL No results found for: HDL No results found for: LDLCALC No results found for: TRIG No results found for: CHOLHDL No results found for: LDLDIRECT    Radiology: Prior echocardiogram and nuclear stress test from August 2015 were reviewed and were both normal. EKG: Normal  ASSESSMENT AND PLAN:   1) chest pain: Atypical. She has been having essentially constant chest pain for several weeks. Her EKG today is normal while she is having pain. It is worse with outpatient of the left side of her chest. She may have costochondritis. She is already taking some anti-inflammatories intermittently. Taking the ibuprofen on a regular basis for a week may help. I don't think any further ischemia evaluation is required. She has had a very thorough workup at the [facility] medical clinic.  She needs aggressive primary prevention.  She'll try to exercise as much as possible. She is limited by joint pains and orthopedic issues.  Signed:   Mina Marble, MD, [facility] 11/22/2014, 1:11 PM

## 2014-12-10 ENCOUNTER — Ambulatory Visit: Payer: 59 | Admitting: Internal Medicine

## 2014-12-14 ENCOUNTER — Encounter: Payer: Self-pay | Admitting: Internal Medicine

## 2014-12-14 ENCOUNTER — Ambulatory Visit (INDEPENDENT_AMBULATORY_CARE_PROVIDER_SITE_OTHER): Payer: 59 | Admitting: Internal Medicine

## 2014-12-14 VITALS — BP 130/84 | HR 92 | Temp 98.0°F | Resp 18 | Wt 158.0 lb

## 2014-12-14 DIAGNOSIS — M545 Low back pain: Secondary | ICD-10-CM

## 2014-12-14 MED ORDER — GABAPENTIN 300 MG PO CAPS: 300.0000 mg | ORAL_CAPSULE | Freq: Three times a day (TID) | ORAL | Status: DC

## 2014-12-14 NOTE — Assessment & Plan Note (Signed)
Will continue the ibuprofen and increase the gabapentin over the next 1-2 weeks to 300 mg TID to see if this helps. A lot of her pain in the back and legs does seem to be nerve pain which should respond to the gabapentin. Hopefully this will help her to be more steady on her feet and decrease falls.

## 2014-12-14 NOTE — Patient Instructions (Addendum)
We will send in a new prescription for the gabapentin for 300 mg. Tomorrow take a 300 mg pill in the morning and 100 mg at night time. Do this for 2 days. After that you can take 300 mg in the morning and at night time. Do this for 2 days. If you are still having pain add a 300 mg in the afternoon which means you can take it 3 times a day for the pain.   We will see you back in about 2-3 months to see how you are doing. If you have any problems or questions sooner call the office.

## 2014-12-14 NOTE — Progress Notes (Signed)
   Subjective:    Patient ID: Megan Gonzalez, female    DOB: Dec 23, 1970, 44 y.o.   MRN: [id number]  HPI The patient is a 44 YO female who is coming in to follow up on her back and leg pain. She has had it for several years and it seems like it is worsening lately. She is using gabapentin which helps some but not enough. The pain makes her feel unsteady on her feet and she has fallen several times recently. She denies new numbness in her legs or bowel or bladder incontinence. She rates it a 4/10 when good and 8/10 -10/10 when bad. She is still having a lot of other concerns and issues and goes to [facility] for her second opinion urogynecology in several weeks. She is still having the chest pains although they seem to be more mild. The ibuprofen is still helping.   Review of Systems  Constitutional: Positive for activity change. Negative for fever, appetite change, fatigue and unexpected weight change.  HENT: Negative.   Eyes: Negative.   Respiratory: Negative for cough, chest tightness, shortness of breath and wheezing.   Cardiovascular: Negative for chest pain, palpitations and leg swelling.  Gastrointestinal: Positive for diarrhea and constipation. Negative for nausea, vomiting, abdominal pain and abdominal distention.  Genitourinary: Positive for pelvic pain.  Musculoskeletal: Positive for myalgias, back pain and arthralgias. Negative for gait problem.  Skin: Negative.   Neurological: Positive for weakness and numbness. Negative for dizziness, light-headedness and headaches.  Psychiatric/Behavioral: Positive for dysphoric mood.      Objective:   Physical Exam  Constitutional: She is oriented to person, place, and time. She appears well-developed and well-nourished.  HENT:  Head: Normocephalic and atraumatic.  Eyes: EOM are normal.  Neck: Normal range of motion.  Cardiovascular: Normal rate and regular rhythm.   Pulmonary/Chest: Effort normal and breath sounds normal.  Abdominal: Soft.  Bowel sounds are normal. She exhibits no distension. There is no tenderness. There is no rebound.  Neurological: She is alert and oriented to person, place, and time. Coordination normal.  Skin: Skin is warm and dry.   Filed Vitals:   12/14/14 1618  BP: 130/84  Pulse: 92  Temp: 98 F (36.7 C)  TempSrc: Oral  Resp: 18  Weight: 158 lb 0.6 oz (71.686 kg)  SpO2: 97%      Assessment & Plan:

## 2014-12-14 NOTE — Progress Notes (Signed)
Pre visit review using our clinic review tool, if applicable. No additional management support is needed unless otherwise documented below in the visit note. 

## 2014-12-17 ENCOUNTER — Telehealth: Payer: Self-pay | Admitting: Neurology

## 2014-12-17 NOTE — Telephone Encounter (Signed)
Received request from [facility] for medical records and forwarded to medical records department.

## 2015-12-20 ENCOUNTER — Other Ambulatory Visit: Payer: Self-pay | Admitting: Internal Medicine

## 2016-04-03 ENCOUNTER — Ambulatory Visit (INDEPENDENT_AMBULATORY_CARE_PROVIDER_SITE_OTHER): Payer: Commercial Managed Care - HMO | Admitting: Medical

## 2016-04-03 ENCOUNTER — Ambulatory Visit (HOSPITAL_BASED_OUTPATIENT_CLINIC_OR_DEPARTMENT_OTHER)
Admission: RE | Admit: 2016-04-03 | Discharge: 2016-04-03 | Disposition: A | Discharge status: Home / Self Care | Payer: Commercial Managed Care - HMO | Source: Ambulatory Visit | Attending: Medical | Admitting: Medical

## 2016-04-03 ENCOUNTER — Telehealth: Payer: Self-pay | Admitting: Medical

## 2016-04-03 ENCOUNTER — Encounter: Payer: Self-pay | Admitting: Medical

## 2016-04-03 VITALS — BP 124/82 | HR 87 | Temp 97.7°F | Ht 62.0 in | Wt 156.8 lb

## 2016-04-03 DIAGNOSIS — R1011 Right upper quadrant pain: Secondary | ICD-10-CM

## 2016-04-03 DIAGNOSIS — R197 Diarrhea, unspecified: Secondary | ICD-10-CM

## 2016-04-03 DIAGNOSIS — R1013 Epigastric pain: Secondary | ICD-10-CM | POA: Diagnosis not present

## 2016-04-03 LAB — CBC WITH DIFFERENTIAL/PLATELET
BASOS PCT: 0.3 % (ref 0.0–3.0)
Basophils Absolute: 0 10*3/uL (ref 0.0–0.1)
Eosinophils Absolute: 0.1 10*3/uL (ref 0.0–0.7)
Eosinophils Relative: 1.7 % (ref 0.0–5.0)
HEMATOCRIT: 40.7 % (ref 36.0–46.0)
Hemoglobin: 13.4 g/dL (ref 12.0–15.0)
LYMPHS PCT: 41.7 % (ref 12.0–46.0)
Lymphs Abs: 1.9 10*3/uL (ref 0.7–4.0)
MCHC: 32.9 g/dL (ref 30.0–36.0)
MCV: 81.1 fl (ref 78.0–100.0)
MONOS PCT: 7.5 % (ref 3.0–12.0)
Monocytes Absolute: 0.3 10*3/uL (ref 0.1–1.0)
NEUTROS ABS: 2.2 10*3/uL (ref 1.4–7.7)
Neutrophils Relative %: 48.8 % (ref 43.0–77.0)
PLATELETS: 279 10*3/uL (ref 150.0–400.0)
RBC: 5.02 Mil/uL (ref 3.87–5.11)
RDW: 13.3 % (ref 11.5–15.5)
WBC: 4.6 10*3/uL (ref 4.0–10.5)

## 2016-04-03 LAB — COMPREHENSIVE METABOLIC PANEL
ALK PHOS: 67 U/L (ref 39–117)
ALT: 12 U/L (ref 0–35)
AST: 16 U/L (ref 0–37)
Albumin: 4.7 g/dL (ref 3.5–5.2)
BILIRUBIN TOTAL: 0.4 mg/dL (ref 0.2–1.2)
BUN: 9 mg/dL (ref 6–23)
CALCIUM: 9.4 mg/dL (ref 8.4–10.5)
CO2: 31 mEq/L (ref 19–32)
Chloride: 103 mEq/L (ref 96–112)
Creatinine, Ser: 1.18 mg/dL (ref 0.40–1.20)
GFR: 63.94 mL/min (ref >60.00)
GLUCOSE: 85 mg/dL (ref 70–99)
Potassium: 4.4 mEq/L (ref 3.5–5.1)
Sodium: 138 mEq/L (ref 135–145)
TOTAL PROTEIN: 7.2 g/dL (ref 6.0–8.3)

## 2016-04-03 LAB — POC URINALSYSI DIPSTICK (AUTOMATED)
Bilirubin, UA: NEGATIVE
Glucose, UA: NEGATIVE
KETONES UA: NEGATIVE
LEUKOCYTES UA: NEGATIVE
Nitrite, UA: NEGATIVE
PH UA: 6
PROTEIN UA: NEGATIVE
RBC UA: NEGATIVE
SPEC GRAV UA: 1.025
Urobilinogen, UA: 0.2

## 2016-04-03 LAB — LIPASE: LIPASE: 7 U/L — AB (ref 11.0–59.0)

## 2016-04-03 LAB — AMYLASE: AMYLASE: 46 U/L (ref 27–131)

## 2016-04-03 MED ORDER — RANITIDINE HCL 150 MG PO CAPS: 150.0000 mg | ORAL_CAPSULE | Freq: Two times a day (BID) | ORAL | Status: DC

## 2016-04-03 MED ORDER — GI COCKTAIL ~~LOC~~
30.0000 mL | Freq: Once | ORAL | Status: AC
  Administered 2016-04-03: 30 mL via ORAL

## 2016-04-03 NOTE — Progress Notes (Signed)
Subjective:    Patient ID: Megan Gonzalez, female    DOB: 08-22-1971, 45 y.o.   MRN: [id number]  HPI  Pt is in for some upper abdomen pain that seems to radiate toward her back. Pt has been having bowel movements daily. Yesterday has loose stools. Having loose stools 3-4 times last 2 days. This worse than her usual loose stool pattern. Pt reports a lot of gas/flatulence. Burping some. After eats no nausea or vomiting. Pt appetite decreased. Pt describes bloated abdomen sensation and crampy pain upper abdomen. Pt specifies that pain is more in ruq. Seems to run toward her rt upper back. She has not eaten since yesterday 6 pm.   Pt states recently her GI MD gave linzess(so appears ibs-c) and  delixant   (pt pcp is Dr. Benson Norway). Pt states she had this type pain for 3 wks. When she Dr. Benson Norway. Pt states she has seen Dr. Benson Norway twice. Pt states she has never had EGD or colonoscopy.  Pt states one abdominal surgery being hysterectomy.     Review of Systems  Constitutional: Negative for fever, chills and fatigue.  HENT: Negative for congestion.   Respiratory: Negative for cough, chest tightness, shortness of breath and wheezing.   Cardiovascular: Negative for chest pain and palpitations.  Gastrointestinal: Positive for abdominal pain, diarrhea and abdominal distention. Negative for nausea, vomiting, constipation, blood in stool and rectal pain.       Loose stools  Neurological: Negative for dizziness and headaches.  Hematological: Negative for adenopathy. Does not bruise/bleed easily.  Psychiatric/Behavioral: Negative for behavioral problems and confusion.   Past Medical History  Diagnosis Date  . Sleep apnea     CPAP-non compliant  . Headache(784.0)   . Anemia   . Depression   . Arthritis     chronic low back pain  . Anxiety   . Cancer [facility])      Social History   Social History  . Marital Status: Married    Spouse Name: N/A  . Number of Children: N/A  . Years of Education: N/A    Occupational History  . Not on file.   Social History Main Topics  . Smoking status: Never Smoker   . Smokeless tobacco: Never Used  . Alcohol Use: No  . Drug Use: No  . Sexual Activity: Not Currently    Birth Control/ Protection: Surgical   Other Topics Concern  . Not on file   Social History Narrative    Past Surgical History  Procedure Laterality Date  . Foot surgery  2006    left  . Svd      x 3  . Colonoscopy  1999  . Tonsillectomy  as child  . Pubovaginal sling  03/24/2012    Procedure: Gaynelle Arabian;  Surgeon: Ailene Rud, MD;  Location: [facility];  Service: Urology;  Laterality: N/A;  lynx with Chemical engineer   . Dilation and curettage of uterus    . Ureteral exploration  5./30/13  . Abdominal hysterectomy    . Removal of urinary sling    . Vaginal hysterectomy N/A 1/2/015    [name], Dr Linus Mako and Dr Leory Plowman, with release of Pubovag sling  . Incision and drainage abscess N/A 11/19/2013    Procedure: POSTERIOR CULPOTOMY (INCISION AND DRAINAGE OF POST OPERATIVE CUFF HEMATOMA);  Surgeon: Jonnie Kind, MD;  Location: [facility];  Service: Gynecology;  Laterality: N/A;  . Bladder neurostimulator      Family History  Problem Relation  Age of Onset  . Anesthesia problems Neg Hx   . Heart disease Father     Allergies  Allergen Reactions  . Codeine Nausea And Vomiting  . Penicillins Other (See Comments)    Unknown- childhood reaction    Current Outpatient Prescriptions on File Prior to Visit  Medication Sig Dispense Refill  . BRINTELLIX 10 MG TABS Take by mouth daily. One daily    . cyclobenzaprine (FLEXERIL) 10 MG tablet Take 10 mg by mouth 3 (three) times daily as needed for muscle spasms.    Marland Kitchen gabapentin (NEURONTIN) 300 MG capsule TAKE 1 CAPSULE BY MOUTH THREE TIMES DAILY 90 capsule 0  . ibuprofen (ADVIL,MOTRIN) 200 MG tablet Take 600 mg by mouth every 6 (six) hours as needed for moderate pain.    Marland Kitchen lidocaine (LIDODERM) 5 % Place 1  patch onto the skin daily. Remove & Discard patch within 12 hours or as directed by MD    . Rolan Lipa 145 MCG CAPS capsule Take 145 mcg by mouth daily.     . methocarbamol (ROBAXIN) 750 MG tablet Take 750 mg by mouth 3 (three) times daily.     . ondansetron (ZOFRAN ODT) 4 MG disintegrating tablet Take 1 tablet (4 mg total) by mouth every 8 (eight) hours as needed for nausea or vomiting. 10 tablet 0   No current facility-administered medications on file prior to visit.    BP 124/82 mmHg  Pulse 87  Temp(Src) 97.7 F (36.5 C) (Oral)  Ht '5\' 2"'  (1.575 m)  Wt 156 lb 12.8 oz (71.124 kg)  BMI 28.67 kg/m2  SpO2 98%  LMP 04/24/2012       Objective:   Physical Exam  General Appearance- Not in acute distress.  HEENT Eyes- Scleraeral/Conjuntiva-bilat- Not Yellow. Mouth & Throat- Normal.  Chest and Lung Exam Auscultation: Breath sounds:-Normal. Adventitious sounds:- No Adventitious sounds.  Cardiovascular Auscultation:Rythm - Regular. Heart Sounds -Normal heart sounds.  Abdomen Inspection:-Inspection Normal.  Palpation/Perucssion: Palpation and Percussion of the abdomen reveal- Ruq moderate Tender and mild epigastric tender, No Rebound tenderness, No rigidity(Guarding) and No Palpable abdominal masses.  Liver:-Normal.  Spleen:- Normal.   Back- faint cva tenderness. (pt does not pain from ruq at time seems to radiate toward rt upper back.lateral to rt scapula region.     Assessment & Plan:   For your abdomen pain we will get cbc, cmp, amylase, lipase and h pylori. Continue delixant.  Your loose stools may be related to linzess. I would ask you call your GI office and notify them on how you are feeling/loose stools. If loose still still exceeding 3 times a day by Monday then do stool panel kit.  For your ruq pain go down to radiology after lab work. Study will be done this am. You can wait downstairs for stat result or come upstairs. After I get test results might give you GI  cocktail.  Also depending on lab results and [place], I may rx ranitidine to add to delixant treatment.   Today we are doing initial test. But if over weekend your symptoms change or worsen then ED evaluation.  Follow up in 3-4 days or as needed  Leanda Padmore, Percell Miller, [employer]

## 2016-04-03 NOTE — Addendum Note (Signed)
Addended by: Tasia Catchings on: 04/03/2016 02:00 PM   Modules accepted: Orders

## 2016-04-03 NOTE — Progress Notes (Signed)
Pre visit review using our clinic review tool, if applicable. No additional management support is needed unless otherwise documented below in the visit note. 

## 2016-04-03 NOTE — Telephone Encounter (Signed)
Sent ranitidine rx to her pharmacy

## 2016-04-03 NOTE — Patient Instructions (Addendum)
For your abdomen pain we will get cbc, cmp, amylase, lipase and h pylori. Continue delixant.  Your loose stools may be related to linzess. I would ask you call your GI office and notify them on how you are feeling/loose stools. If loose still still exceeding 3 times a day by Monday then do stool panel kit.  For your ruq pain go down to radiology after lab work. Study will be done this am. You can wait downstairs for stat result or come upstairs. After I get test results might give you GI cocktail.  Also depending on lab results and [place], I may rx ranitidine to add to delixant treatment.   Today we are doing initial test. But if over weekend your symptoms change or worsen then ED evaluation.  Follow up in 3-4 days or as needed

## 2016-04-06 ENCOUNTER — Emergency Department (HOSPITAL_BASED_OUTPATIENT_CLINIC_OR_DEPARTMENT_OTHER): Payer: Commercial Managed Care - HMO

## 2016-04-06 ENCOUNTER — Emergency Department (HOSPITAL_BASED_OUTPATIENT_CLINIC_OR_DEPARTMENT_OTHER)
Admission: EM | Admit: 2016-04-06 | Discharge: 2016-04-06 | Disposition: A | Discharge status: Home / Self Care | Payer: Commercial Managed Care - HMO | Attending: Emergency Medicine | Admitting: Emergency Medicine

## 2016-04-06 ENCOUNTER — Encounter (HOSPITAL_BASED_OUTPATIENT_CLINIC_OR_DEPARTMENT_OTHER): Payer: Self-pay | Admitting: Emergency Medicine

## 2016-04-06 ENCOUNTER — Telehealth: Payer: Self-pay | Admitting: Medical

## 2016-04-06 DIAGNOSIS — K2970 Gastritis, unspecified, without bleeding: Secondary | ICD-10-CM | POA: Diagnosis not present

## 2016-04-06 DIAGNOSIS — F329 Major depressive disorder, single episode, unspecified: Secondary | ICD-10-CM | POA: Insufficient documentation

## 2016-04-06 DIAGNOSIS — R1013 Epigastric pain: Secondary | ICD-10-CM

## 2016-04-06 DIAGNOSIS — M1990 Unspecified osteoarthritis, unspecified site: Secondary | ICD-10-CM | POA: Insufficient documentation

## 2016-04-06 LAB — CBC WITH DIFFERENTIAL/PLATELET
BASOS PCT: 0 %
Basophils Absolute: 0 10*3/uL (ref 0.0–0.1)
EOS ABS: 0.1 10*3/uL (ref 0.0–0.7)
Eosinophils Relative: 2 %
HEMATOCRIT: 36.6 % (ref 36.0–46.0)
HEMOGLOBIN: 12.3 g/dL (ref 12.0–15.0)
Lymphocytes Relative: 39 %
Lymphs Abs: 2.4 10*3/uL (ref 0.7–4.0)
MCH: 27.1 pg (ref 26.0–34.0)
MCHC: 33.6 g/dL (ref 30.0–36.0)
MCV: 80.6 fL (ref 78.0–100.0)
MONOS PCT: 8 %
Monocytes Absolute: 0.5 10*3/uL (ref 0.1–1.0)
NEUTROS ABS: 3.1 10*3/uL (ref 1.7–7.7)
NEUTROS PCT: 51 %
Platelets: 252 10*3/uL (ref 150–400)
RBC: 4.54 MIL/uL (ref 3.87–5.11)
RDW: 12.9 % (ref 11.5–15.5)
WBC: 6.1 10*3/uL (ref 4.0–10.5)

## 2016-04-06 LAB — URINALYSIS, ROUTINE W REFLEX MICROSCOPIC
BILIRUBIN URINE: NEGATIVE
Glucose, UA: NEGATIVE mg/dL
Hgb urine dipstick: NEGATIVE
KETONES UR: NEGATIVE mg/dL
LEUKOCYTES UA: NEGATIVE
NITRITE: NEGATIVE
PH: 6.5 (ref 5.0–8.0)
Protein, ur: NEGATIVE mg/dL
SPECIFIC GRAVITY, URINE: 1.015 (ref 1.005–1.030)

## 2016-04-06 LAB — COMPREHENSIVE METABOLIC PANEL
ALBUMIN: 4.1 g/dL (ref 3.5–5.0)
ALK PHOS: 61 U/L (ref 38–126)
ALT: 12 U/L — AB (ref 14–54)
ANION GAP: 5 (ref 5–15)
AST: 20 U/L (ref 15–41)
BILIRUBIN TOTAL: 0.5 mg/dL (ref 0.3–1.2)
BUN: 13 mg/dL (ref 6–20)
CALCIUM: 8.7 mg/dL — AB (ref 8.9–10.3)
CO2: 26 mmol/L (ref 22–32)
CREATININE: 1.11 mg/dL — AB (ref 0.44–1.00)
Chloride: 106 mmol/L (ref 101–111)
GFR calc Af Amer: >60 mL/min (ref >60)
GFR calc non Af Amer: 59 mL/min — ABNORMAL LOW (ref >60)
GLUCOSE: 90 mg/dL (ref 65–99)
Potassium: 3.7 mmol/L (ref 3.5–5.1)
SODIUM: 137 mmol/L (ref 135–145)
TOTAL PROTEIN: 6.4 g/dL — AB (ref 6.5–8.1)

## 2016-04-06 LAB — H. PYLORI BREATH TEST: H. pylori Breath Test: NOT DETECTED

## 2016-04-06 LAB — LIPASE, BLOOD: Lipase: 19 U/L (ref 11–51)

## 2016-04-06 LAB — PREGNANCY, URINE: Preg Test, Ur: NEGATIVE

## 2016-04-06 MED ORDER — DICYCLOMINE HCL 20 MG PO TABS: 20.0000 mg | ORAL_TABLET | Freq: Two times a day (BID) | ORAL | Status: DC

## 2016-04-06 MED ORDER — MORPHINE SULFATE (PF) 4 MG/ML IV SOLN
4.0000 mg | Freq: Once | INTRAVENOUS | Status: AC
  Administered 2016-04-06: 4 mg via INTRAVENOUS
  Filled 2016-04-06 (×2): qty 1

## 2016-04-06 MED ORDER — PANTOPRAZOLE SODIUM 40 MG PO TBEC: 40.0000 mg | DELAYED_RELEASE_TABLET | Freq: Every day | ORAL | Status: DC

## 2016-04-06 MED ORDER — ONDANSETRON HCL 4 MG/2ML IJ SOLN
4.0000 mg | Freq: Once | INTRAMUSCULAR | Status: AC
  Administered 2016-04-06: 4 mg via INTRAVENOUS
  Filled 2016-04-06: qty 2

## 2016-04-06 MED ORDER — SODIUM CHLORIDE 0.9 % IV BOLUS (SEPSIS)
1000.0000 mL | Freq: Once | INTRAVENOUS | Status: AC
  Administered 2016-04-06: 1000 mL via INTRAVENOUS

## 2016-04-06 NOTE — ED Notes (Signed)
Patient is in [place]

## 2016-04-06 NOTE — Telephone Encounter (Signed)
Pt sent mychart msg and is requesting call back related to pain and results of [place]. She is in pain again.    Appointment Request From: Megan Gonzalez       With Provider: [facility]]      Preferred Date Range: From 04/06/2016 To 04/07/2016      Reason for visit: Office Visit      Comments:   I'm still not feeling well. My pain is intense in my upper back and feeling very nausea. Is there any other medicine you can prescribed for this pain. I felt good after the GiI cocktail and the pain came back on Sunday more intense

## 2016-04-06 NOTE — ED Notes (Signed)
[phone] Megan Gonzalez Pts mother

## 2016-04-06 NOTE — ED Provider Notes (Signed)
CSN: [id number]     Arrival date & time 04/06/16  1839 History  By signing my name below, I, Soijett Blue, attest that this documentation has been prepared under the direction and in the presence of Gareth Morgan, MD. Electronically Signed: Soijett Blue, ED Scribe. 04/06/2016. 7:29 PM.  Chief Complaint  Patient presents with  . Abdominal Pain      The history is provided by the patient. No language interpreter was used.    HPI Comments: Analecia Ytuarte is a 45 y.o. female with a medical hx of pelvic pain, bladder pain who presents to the Emergency Department complaining of 8-9/10, constant, sharp, abdominal pain onset 3-4 weeks. Pt reports that her abdominal pain radiates to her mid back and her symptoms were initially intermittent and are now constant. Pt was seen at [place] 3 days ago and given a GI cocktail that alleviated her symptoms at the time. Pt notes that her abdominal pain is worsened following eating, and she denies having a fatty diet. Pt has been seen by her GI doctor and had medications changed. She states that she is having associated symptoms of nausea, mucous in stool, and night sweats. She states that she has tried 600 mg ibuprofen for the relief for her symptoms. She denies fever, dysuria, and any other symptoms. Pt notes that she still has her gallbladder.    Past Medical History  Diagnosis Date  . Sleep apnea     CPAP-non compliant  . Headache(784.0)   . Anemia   . Depression   . Arthritis     chronic low back pain  . Anxiety   . Cancer [facility])    Past Surgical History  Procedure Laterality Date  . Foot surgery  2006    left  . Svd      x 3  . Colonoscopy  1999  . Tonsillectomy  as child  . Pubovaginal sling  03/24/2012    Procedure: Gaynelle Arabian;  Surgeon: Ailene Rud, MD;  Location: [facility];  Service: Urology;  Laterality: N/A;  lynx with Chemical engineer   . Dilation and curettage of uterus    . Ureteral exploration  5./30/13  .  Abdominal hysterectomy    . Removal of urinary sling    . Vaginal hysterectomy N/A 1/2/015    [name], Dr Linus Mako and Dr Leory Plowman, with release of Pubovag sling  . Incision and drainage abscess N/A 11/19/2013    Procedure: POSTERIOR CULPOTOMY (INCISION AND DRAINAGE OF POST OPERATIVE CUFF HEMATOMA);  Surgeon: Jonnie Kind, MD;  Location: [place] [facility];  Service: Gynecology;  Laterality: N/A;  . Bladder neurostimulator     Family History  Problem Relation Age of Onset  . Anesthesia problems Neg Hx   . Heart disease Father    Social History  Substance Use Topics  . Smoking status: Never Smoker   . Smokeless tobacco: Never Used  . Alcohol Use: No   OB History    Gravida Para Term Preterm AB TAB SAB Ectopic Multiple Living   4 3 3  0 1 0 1 0 0 3     Review of Systems  Constitutional: Negative for fever.  HENT: Negative for sore throat.   Eyes: Negative for visual disturbance.  Respiratory: Negative for cough and shortness of breath.   Cardiovascular: Negative for chest pain.  Gastrointestinal: Positive for nausea and abdominal pain. Negative for vomiting, diarrhea and constipation.  Genitourinary: Negative for dysuria and difficulty urinating.  Musculoskeletal: Positive for back pain. Negative  for neck pain.  Skin: Negative for rash.  Neurological: Negative for syncope and headaches.      Allergies  Codeine and Penicillins  Home Medications   Prior to Admission medications   Medication Sig Start Date End Date Taking? Authorizing Provider  BRINTELLIX 10 MG TABS Take by mouth daily. One daily 08/08/14   Historical Provider, MD  cyclobenzaprine (FLEXERIL) 10 MG tablet Take 10 mg by mouth 3 (three) times daily as needed for muscle spasms.    Historical Provider, MD  dicyclomine (BENTYL) 20 MG tablet Take 1 tablet (20 mg total) by mouth 2 (two) times daily. 04/06/16   Gareth Morgan, MD  gabapentin (NEURONTIN) 300 MG capsule TAKE 1 CAPSULE BY MOUTH THREE TIMES DAILY 12/20/15    Hoyt Koch, MD  ibuprofen (ADVIL,MOTRIN) 200 MG tablet Take 600 mg by mouth every 6 (six) hours as needed for moderate pain.    Historical Provider, MD  lidocaine (LIDODERM) 5 % Place 1 patch onto the skin daily. Remove & Discard patch within 12 hours or as directed by MD    Historical Provider, MD  LINZESS 145 MCG CAPS capsule Take 145 mcg by mouth daily.  08/05/14   Historical Provider, MD  methocarbamol (ROBAXIN) 750 MG tablet Take 750 mg by mouth 3 (three) times daily.     Historical Provider, MD  ondansetron (ZOFRAN ODT) 4 MG disintegrating tablet Take 1 tablet (4 mg total) by mouth every 8 (eight) hours as needed for nausea or vomiting. 10/01/13   Kaitlyn Szekalski, PA-C  pantoprazole (PROTONIX) 40 MG tablet Take 1 tablet (40 mg total) by mouth daily. 04/06/16   Gareth Morgan, MD  ranitidine (ZANTAC) 150 MG capsule Take 1 capsule (150 mg total) by mouth 2 (two) times daily. 04/03/16   Edward Saguier, PA-C   BP 125/83 mmHg  Pulse 82  Temp(Src) 98.4 F (36.9 C) (Oral)  Resp 16  Ht 5\' 2"  (1.575 m)  Wt 156 lb (70.761 kg)  BMI 28.53 kg/m2  SpO2 98%  LMP 04/24/2012 Physical Exam  Constitutional: She is oriented to person, place, and time. She appears well-developed and well-nourished. No distress.  HENT:  Head: Normocephalic and atraumatic.  Eyes: EOM are normal.  Neck: Neck supple.  Cardiovascular: Normal rate, regular rhythm and normal heart sounds.  Exam reveals no gallop and no friction rub.   No murmur heard. Pulmonary/Chest: Effort normal and breath sounds normal. No respiratory distress. She has no wheezes. She has no rales.  Abdominal: Soft. She exhibits no distension. There is tenderness in the right upper quadrant, epigastric area and left upper quadrant. There is no rebound and no guarding.  Tenderness to RUQ, LUQ, and epigastric region. No rebound or guarding. Bilateral CVA tenderness.  Neg murphy's sign  Musculoskeletal: Normal range of motion.  Midline thoracic  tenderness.   Neurological: She is alert and oriented to person, place, and time.  Skin: Skin is warm and dry.  Psychiatric: She has a normal mood and affect. Her behavior is normal.  Nursing note and vitals reviewed.   ED Course  Procedures (including critical care time) DIAGNOSTIC STUDIES: Oxygen Saturation is 100% on RA, nl by my interpretation.    COORDINATION OF CARE: 7:28 PM Discussed treatment plan with pt at bedside which includes UA, labs, US gallbladder, and pt agreed to plan.    Labs Review Labs Reviewed  URINALYSIS, ROUTINE W REFLEX MICROSCOPIC (NOT AT [facility]) - Abnormal; Notable for the following:    APPearance CLOUDY (*)  All other components within normal limits  COMPREHENSIVE METABOLIC PANEL - Abnormal; Notable for the following:    Creatinine, Ser 1.11 (*)    Calcium 8.7 (*)    Total Protein 6.4 (*)    ALT 12 (*)    GFR calc non Af Amer 59 (*)    All other components within normal limits  PREGNANCY, URINE  CBC WITH DIFFERENTIAL/PLATELET  LIPASE, BLOOD    Imaging Review Dg Thoracic Spine 2 View  04/06/2016  CLINICAL DATA:  Right upper quadrant pain that radiates to the upper back. EXAM: THORACIC SPINE 2 VIEWS COMPARISON:  Chest CT 10/02/2014 FINDINGS: Normal alignment of the thoracic spine. The vertebral body heights are maintained. Normal alignment at the cervicothoracic junction. IMPRESSION: No acute abnormality. Electronically Signed   By: Markus Daft M.D.   On: 04/06/2016 20:46   US Abdomen Limited Ruq  04/06/2016  CLINICAL DATA:  Right upper quadrant pain that radiates to the back for 1 month. EXAM: US ABDOMEN LIMITED - RIGHT UPPER QUADRANT COMPARISON:  Ultrasound 04/03/2016 FINDINGS: Gallbladder: No gallstones or wall thickening visualized. Common bile duct: Diameter: 3 mm. Liver: No focal lesion identified. Within normal limits in parenchymal echogenicity. IMPRESSION: Negative right upper quadrant ultrasound. Electronically Signed   By: Markus Daft M.D.    On: 04/06/2016 20:44   I have personally reviewed and evaluated these images and lab results as part of my medical decision-making.   EKG Interpretation None      MDM   Final diagnoses:  Epigastric abdominal pain  Gastritis    45yo female with history of PTSD, depression, anxiety, abdominal pain, presents with concern for 3 weeks of intermittent abdominal pain with worsening over the last few days.  Labs show no transaminitis or signs of pancreatitis. US shows no cholelithiasis.  XR of thoracic spine obtained given midline tenderness and shows no fx. No sign of UTI. Hx not consistent with nephrolithiasis, appendicitis, obstruction, or pelvic etiology of pain.  Possible gastritis. Gave rx for protonix and bentyl and recommend continued PCP follow up. Patient discharged in stable condition with understanding of reasons to return.   I personally performed the services described in this documentation, which was scribed in my presence. The recorded information has been reviewed and is accurate.    Gareth Morgan, MD 04/07/16 [phone]

## 2016-04-06 NOTE — ED Notes (Addendum)
Patient states that sh is having sharp throbbing pain to her mid back and epigastric region x 3 -4 weeks. Patient states that he pain surrounds her right side from her right upper quad to hr back. The patient reports abnormal BM's recently

## 2016-04-06 NOTE — Telephone Encounter (Signed)
Pt reports that her pain is worse and the pain is going from ribs all around to her stomach and she is feeling more nauseated today than at the last office visit. Pt has a follow up appointment with E. Saguier on 04/07/16. Pt was advised to go to the ER if the pain got worse over night. She voices understanding. Pt did not have any further questions.

## 2016-04-06 NOTE — Discharge Instructions (Signed)

## 2016-04-07 ENCOUNTER — Ambulatory Visit (INDEPENDENT_AMBULATORY_CARE_PROVIDER_SITE_OTHER): Payer: Commercial Managed Care - HMO | Admitting: Medical

## 2016-04-07 ENCOUNTER — Encounter: Payer: Self-pay | Admitting: Medical

## 2016-04-07 VITALS — BP 120/80 | HR 71 | Temp 98.2°F | Ht 62.0 in | Wt 157.0 lb

## 2016-04-07 DIAGNOSIS — R1013 Epigastric pain: Secondary | ICD-10-CM

## 2016-04-07 LAB — CLOSTRIDIUM DIFFICILE BY PCR: Toxigenic C. Difficile by PCR: NOT DETECTED

## 2016-04-07 MED ORDER — GI COCKTAIL ~~LOC~~
30.0000 mL | Freq: Once | ORAL | Status: AC
  Administered 2016-04-07: 30 mL via ORAL

## 2016-04-07 NOTE — Progress Notes (Signed)
Subjective:    Patient ID: Megan Gonzalez, female    DOB: [birth year], 45 y.o.   MRN: [id number]  HPI  Pt in for follow from ED. Pt seen past Friday. I gave her Gi cocktail after work up that day. She states pain controlled/resolved until Sunday afternoon(started to reoccur very slowly. But Monday morning she ate steak for breakfast and pain became worse. Eventually pain would not resolve/became severe and she went to ED. Pt had negative work up in ED. Pt is ranitidne and delixant. She followed up here today. I gave her another GI cocktail and within 15-20 minutes the pain decreased/subsided again as before. Pt has no chest pain. Pt has Gi MD. She never had EGD per her report. Megan. Benson Gonzalez is her MD. Pt had upper GI type symptoms for 2 years per her report.   No black of blood stools. Pt also given rx  bentyl by ED.  ED new she was on delixant. And they gave her protonix. They advised stopping ranitidine.   Review of Systems  Constitutional: Negative for fever, chills and fatigue.  Respiratory: Negative for cough, chest tightness, shortness of breath and wheezing.   Cardiovascular: Negative for chest pain and palpitations.  Gastrointestinal: Positive for abdominal pain. Negative for nausea, vomiting, diarrhea, constipation, blood in stool, abdominal distention and anal bleeding.  Genitourinary: Negative for dysuria, urgency and flank pain.  Musculoskeletal: Negative for back pain.  Skin: Negative for rash.  Neurological: Negative for dizziness and headaches.  Hematological: Negative for adenopathy. Does not bruise/bleed easily.  Psychiatric/Behavioral: Negative for behavioral problems and confusion.    Past Medical History  Diagnosis Date  . Sleep apnea     CPAP-non compliant  . Headache(784.0)   . Anemia   . Depression   . Arthritis     chronic low back pain  . Anxiety   . Cancer [facility])      Social History   Social History  . Marital Status: Married    Spouse Name: N/A    . Number of Children: N/A  . Years of Education: N/A   Occupational History  . Not on file.   Social History Main Topics  . Smoking status: Never Smoker   . Smokeless tobacco: Never Used  . Alcohol Use: No  . Drug Use: No  . Sexual Activity: Not Currently    Birth Control/ Protection: Surgical   Other Topics Concern  . Not on file   Social History Narrative    Past Surgical History  Procedure Laterality Date  . Foot surgery  2006    left  . Svd      x 3  . Colonoscopy  1999  . Tonsillectomy  as child  . Pubovaginal sling  03/24/2012    Procedure: Megan Gonzalez;  Surgeon: Megan Rud, MD;  Location: [facility];  Service: Urology;  Laterality: N/A;  lynx with Chemical engineer   . Dilation and curettage of uterus    . Ureteral exploration  5./30/13  . Abdominal hysterectomy    . Removal of urinary sling    . Vaginal hysterectomy N/A 1/2/015    Megan Gonzalez, Megan Gonzalez, with release of Pubovag sling  . Incision and drainage abscess N/A 11/19/2013    Procedure: POSTERIOR CULPOTOMY (INCISION AND DRAINAGE OF POST OPERATIVE CUFF HEMATOMA);  Surgeon: Megan Kind, MD;  Location: [facility];  Service: Gynecology;  Laterality: N/A;  . Bladder neurostimulator      Family History  Problem Relation Age of Onset  . Anesthesia problems Neg Hx   . Heart disease Father     Allergies  Allergen Reactions  . Codeine Nausea And Vomiting  . Penicillins Other (See Comments)    Unknown- childhood reaction    Current Outpatient Prescriptions on File Prior to Visit  Medication Sig Dispense Refill  . BRINTELLIX 10 MG TABS Take by mouth daily. One daily    . cyclobenzaprine (FLEXERIL) 10 MG tablet Take 10 mg by mouth 3 (three) times daily as needed for muscle spasms.    Marland Kitchen dicyclomine (BENTYL) 20 MG tablet Take 1 tablet (20 mg total) by mouth 2 (two) times daily. 20 tablet 0  . gabapentin (NEURONTIN) 300 MG capsule TAKE 1 CAPSULE BY MOUTH THREE TIMES DAILY 90  capsule 0  . ibuprofen (ADVIL,MOTRIN) 200 MG tablet Take 600 mg by mouth every 6 (six) hours as needed for moderate pain.    Marland Kitchen lidocaine (LIDODERM) 5 % Place 1 patch onto the skin daily. Remove & Discard patch within 12 hours or as directed by MD    . Rolan Lipa 145 MCG CAPS capsule Take 145 mcg by mouth daily.     . methocarbamol (ROBAXIN) 750 MG tablet Take 750 mg by mouth 3 (three) times daily.     . ondansetron (ZOFRAN ODT) 4 MG disintegrating tablet Take 1 tablet (4 mg total) by mouth every 8 (eight) hours as needed for nausea or vomiting. 10 tablet 0  . ranitidine (ZANTAC) 150 MG capsule Take 1 capsule (150 mg total) by mouth 2 (two) times daily. 60 capsule 0   No current facility-administered medications on file prior to visit.    BP 120/80 mmHg  Pulse 71  Temp(Src) 98.2 F (36.8 C) (Oral)  Ht 5\' 2"  (1.575 m)  Wt 157 lb (71.215 kg)  BMI 28.71 kg/m2  SpO2 98%  LMP 04/24/2012       Objective:   Physical Exam  General Appearance- Not in acute distress.  HEENT Eyes- Scleraeral/Conjuntiva-bilat- Not Yellow. Mouth & Throat- Normal.  Chest and Lung Exam Auscultation: Breath sounds:-Normal. Adventitious sounds:- No Adventitious sounds.  Cardiovascular Auscultation:Rythm - Regular. Heart Sounds -Normal heart sounds.  Abdomen Inspection:-Inspection Normal.  Palpation/Perucssion: Palpation and Percussion of the abdomen reveal- faint epigastric  Tender now after gi cocktail, No Rebound tenderness, No rigidity(Guarding) and No Palpable abdominal masses.  Liver:-Normal.  Spleen:- Normal.   Back- no cva tenderness     Assessment & Plan:  You responded well to Gi cocktail today. Please eat bland healthy diet rest of the day. Continue current regimen Megan. Benson Gonzalez wrote you and the ranitidine I gave you. We got you an appointment with Megan. Benson Gonzalez tomorrow morning. I would not fill med ED wrote you since Megan. Benson Gonzalez may change med.  If your abdomen flares/severe again then ED evaluation  but I don't expect that to occur.  Follow up as regularly scheduled with [place] after Gi evaluation.  Megan Gonzalez, Megan Miller, PA-C

## 2016-04-07 NOTE — Patient Instructions (Signed)
You responded well to Gi cocktail today. Please eat bland healthy diet rest of the day. Continue current regimen Dr. Benson Norway wrote you and the ranitidine I gave you. We got you an appointment with Dr. Benson Norway tomorrow morning. I would not fill med ED wrote you since Dr. Benson Norway may change med.  If your abdomen flares/severe again then ED evaluation but I don't expect that to occur.  Follow up as regularly scheduled with [place] after Gi evaluation.

## 2016-04-07 NOTE — Progress Notes (Signed)
Pre visit review using our clinic review tool, if applicable. No additional management support is needed unless otherwise documented below in the visit note. 

## 2016-04-08 LAB — OVA AND PARASITE EXAMINATION: OP: NONE SEEN

## 2016-04-09 ENCOUNTER — Encounter: Payer: Self-pay | Admitting: Medical

## 2016-04-09 ENCOUNTER — Ambulatory Visit (INDEPENDENT_AMBULATORY_CARE_PROVIDER_SITE_OTHER): Payer: Commercial Managed Care - HMO | Admitting: Medical

## 2016-04-09 VITALS — BP 116/80 | HR 71 | Temp 97.7°F | Ht 62.0 in | Wt 155.0 lb

## 2016-04-09 DIAGNOSIS — R1013 Epigastric pain: Secondary | ICD-10-CM

## 2016-04-09 MED ORDER — GI COCKTAIL ~~LOC~~
30.0000 mL | Freq: Once | ORAL | Status: AC
  Administered 2016-04-09: 30 mL via ORAL

## 2016-04-09 NOTE — Patient Instructions (Signed)
You saw Dr. Allene Pyo yesterday. We gave you GI cocktail today to help you until you can start taking med daily as Dr. Benson Norway wrote. Pt pharmacy can fill her med tomorrow. Please pick up rx and take as GI directed. If with this regimen pain persists please let GI know. It appears in that event you will need EGD to directly visualize the stomach area.  Follow up here as regularly scheduled or as needed

## 2016-04-09 NOTE — Progress Notes (Signed)
Pre visit review using our clinic review tool, if applicable. No additional management support is needed unless otherwise documented below in the visit note. 

## 2016-04-09 NOTE — Progress Notes (Signed)
Subjective:    Patient ID: Megan Gonzalez, female    DOB: 1971-03-27, 45 y.o.   MRN: [id number]  HPI  Pt in for evaluation regarding her epigastric pain. Pt saw Benson Norway and he wrote GI cocktail for consecutive days. He also wrote her for tapered dose of steroids.   Pt was trying to get gi cocktail at pharamacy. But no pharmacy could fill today. The soonest she could get filled would be tomorrow. Since she could not get it filled today we offered her to come in today for GI cocktail.  Pt states last cocktails I gave her woud resolve symptom for about 48 hours.  Pt states that if above does not help she may get egd soon.  Review of Systems  Constitutional: Negative for fever, chills and fatigue.  Respiratory: Negative for cough, chest tightness, shortness of breath and wheezing.   Cardiovascular: Negative for chest pain and palpitations.  Gastrointestinal: Positive for abdominal pain. Negative for nausea, diarrhea, constipation and blood in stool.  Musculoskeletal: Negative for back pain and gait problem.  Neurological: Negative for dizziness and headaches.  Hematological: Negative for adenopathy. Does not bruise/bleed easily.  Psychiatric/Behavioral: Negative for behavioral problems and confusion.   Past Medical History  Diagnosis Date  . Sleep apnea     CPAP-non compliant  . Headache(784.0)   . Anemia   . Depression   . Arthritis     chronic low back pain  . Anxiety   . Cancer [facility])      Social History   Social History  . Marital Status: Married    Spouse Name: N/A  . Number of Children: N/A  . Years of Education: N/A   Occupational History  . Not on file.   Social History Main Topics  . Smoking status: Never Smoker   . Smokeless tobacco: Never Used  . Alcohol Use: No  . Drug Use: No  . Sexual Activity: Not Currently    Birth Control/ Protection: Surgical   Other Topics Concern  . Not on file   Social History Narrative    Past Surgical History    Procedure Laterality Date  . Foot surgery  2006    left  . Svd      x 3  . Colonoscopy  1999  . Tonsillectomy  as child  . Pubovaginal sling  03/24/2012    Procedure: Gaynelle Arabian;  Surgeon: Ailene Rud, MD;  Location: [facility];  Service: Urology;  Laterality: N/A;  lynx with Chemical engineer   . Dilation and curettage of uterus    . Ureteral exploration  5./30/13  . Abdominal hysterectomy    . Removal of urinary sling    . Vaginal hysterectomy N/A 1/2/015    [name], Dr Linus Mako and Dr Leory Plowman, with release of Pubovag sling  . Incision and drainage abscess N/A 11/19/2013    Procedure: POSTERIOR CULPOTOMY (INCISION AND DRAINAGE OF POST OPERATIVE CUFF HEMATOMA);  Surgeon: Jonnie Kind, MD;  Location: [facility];  Service: Gynecology;  Laterality: N/A;  . Bladder neurostimulator      Family History  Problem Relation Age of Onset  . Anesthesia problems Neg Hx   . Heart disease Father     Allergies  Allergen Reactions  . Codeine Nausea And Vomiting  . Penicillins Other (See Comments)    Unknown- childhood reaction    Current Outpatient Prescriptions on File Prior to Visit  Medication Sig Dispense Refill  . BRINTELLIX 10 MG TABS Take by mouth  daily. One daily    . cyclobenzaprine (FLEXERIL) 10 MG tablet Take 10 mg by mouth 3 (three) times daily as needed for muscle spasms.    Marland Kitchen dicyclomine (BENTYL) 20 MG tablet Take 1 tablet (20 mg total) by mouth 2 (two) times daily. 20 tablet 0  . gabapentin (NEURONTIN) 300 MG capsule TAKE 1 CAPSULE BY MOUTH THREE TIMES DAILY 90 capsule 0  . ibuprofen (ADVIL,MOTRIN) 200 MG tablet Take 600 mg by mouth every 6 (six) hours as needed for moderate pain.    Marland Kitchen lidocaine (LIDODERM) 5 % Place 1 patch onto the skin daily. Remove & Discard patch within 12 hours or as directed by MD    . Rolan Lipa 145 MCG CAPS capsule Take 145 mcg by mouth daily.     . methocarbamol (ROBAXIN) 750 MG tablet Take 750 mg by mouth 3 (three) times daily.      . ondansetron (ZOFRAN ODT) 4 MG disintegrating tablet Take 1 tablet (4 mg total) by mouth every 8 (eight) hours as needed for nausea or vomiting. 10 tablet 0  . ranitidine (ZANTAC) 150 MG capsule Take 1 capsule (150 mg total) by mouth 2 (two) times daily. 60 capsule 0   No current facility-administered medications on file prior to visit.    BP 116/80 mmHg  Pulse 71  Temp(Src) 97.7 F (36.5 C) (Oral)  Ht 5\' 2"  (1.575 m)  Wt 155 lb (70.308 kg)  BMI 28.34 kg/m2  SpO2 97%  LMP 04/24/2012       Objective:   Physical Exam  General Appearance- Not in acute distress.  HEENT Eyes- Scleraeral/Conjuntiva-bilat- Not Yellow. Mouth & Throat- Normal.  Chest and Lung Exam Auscultation: Breath sounds:-Normal. Adventitious sounds:- No Adventitious sounds.  Cardiovascular Auscultation:Rythm - Regular. Heart Sounds -Normal heart sounds.  Abdomen Inspection:-Inspection Normal.  Palpation/Perucssion: Palpation and Percussion of the abdomen reveal- faint epigastric Tender now, No Rebound tenderness, No rigidity(Guarding) and No Palpable abdominal masses.  Liver:-Normal.  Spleen:- Normal.         Assessment & Plan:  You saw Dr. Allene Pyo yesterday. We gave you GI cocktail today to help you until you can start taking med daily as Dr. Benson Norway wrote. Pt pharmacy can fill her med tomorrow. Please pick up rx and take as GI directed. If with this regimen pain persists please let GI know. It appears in that event you will need EGD to directly visualize the stomach area.  Follow up here as regularly scheduled or as needed

## 2016-04-10 LAB — STOOL CULTURE

## 2016-10-26 DIAGNOSIS — G4733 Obstructive sleep apnea (adult) (pediatric): Secondary | ICD-10-CM | POA: Diagnosis not present

## 2016-10-26 DIAGNOSIS — G2581 Restless legs syndrome: Secondary | ICD-10-CM | POA: Diagnosis not present

## 2016-10-26 DIAGNOSIS — G4750 Parasomnia, unspecified: Secondary | ICD-10-CM | POA: Diagnosis not present

## 2016-11-26 DIAGNOSIS — G4733 Obstructive sleep apnea (adult) (pediatric): Secondary | ICD-10-CM | POA: Diagnosis not present

## 2016-11-26 DIAGNOSIS — G2581 Restless legs syndrome: Secondary | ICD-10-CM | POA: Diagnosis not present

## 2016-11-26 DIAGNOSIS — G4750 Parasomnia, unspecified: Secondary | ICD-10-CM | POA: Diagnosis not present

## 2016-12-23 ENCOUNTER — Telehealth: Payer: Self-pay | Admitting: Internal Medicine

## 2016-12-23 NOTE — Telephone Encounter (Signed)
Pt called in to switch providers. Pt says that the [facility] office is closer for her .    Pt would like to switch to Dr. Lorelei Pont.    CB: [phone]

## 2016-12-23 NOTE — Telephone Encounter (Signed)
ok 

## 2016-12-24 DIAGNOSIS — G4733 Obstructive sleep apnea (adult) (pediatric): Secondary | ICD-10-CM | POA: Diagnosis not present

## 2016-12-24 DIAGNOSIS — G2581 Restless legs syndrome: Secondary | ICD-10-CM | POA: Diagnosis not present

## 2016-12-24 DIAGNOSIS — G4750 Parasomnia, unspecified: Secondary | ICD-10-CM | POA: Diagnosis not present

## 2016-12-24 NOTE — Telephone Encounter (Signed)
fine

## 2016-12-30 NOTE — Telephone Encounter (Signed)
Pt has been scheduled.  °

## 2017-01-04 DIAGNOSIS — G4733 Obstructive sleep apnea (adult) (pediatric): Secondary | ICD-10-CM | POA: Diagnosis not present

## 2017-01-11 DIAGNOSIS — R102 Pelvic and perineal pain: Secondary | ICD-10-CM | POA: Diagnosis not present

## 2017-02-10 ENCOUNTER — Telehealth: Payer: Self-pay | Admitting: Behavioral Health

## 2017-02-10 ENCOUNTER — Encounter: Payer: Self-pay | Admitting: Behavioral Health

## 2017-02-10 NOTE — Telephone Encounter (Signed)
Pre-Visit Call completed with patient and chart updated.   Pre-Visit Info documented in Specialty Comments under [employer].    

## 2017-02-11 ENCOUNTER — Ambulatory Visit (INDEPENDENT_AMBULATORY_CARE_PROVIDER_SITE_OTHER): Payer: Commercial Managed Care - HMO | Admitting: Family Medicine

## 2017-02-11 ENCOUNTER — Other Ambulatory Visit: Payer: Self-pay | Admitting: Family Medicine

## 2017-02-11 ENCOUNTER — Encounter: Payer: Self-pay | Admitting: Family Medicine

## 2017-02-11 VITALS — BP 130/86 | HR 88 | Temp 98.1°F | Ht 62.0 in | Wt 156.4 lb

## 2017-02-11 DIAGNOSIS — Z13220 Encounter for screening for lipoid disorders: Secondary | ICD-10-CM

## 2017-02-11 DIAGNOSIS — E559 Vitamin D deficiency, unspecified: Secondary | ICD-10-CM

## 2017-02-11 DIAGNOSIS — Z1321 Encounter for screening for nutritional disorder: Secondary | ICD-10-CM | POA: Diagnosis not present

## 2017-02-11 DIAGNOSIS — R32 Unspecified urinary incontinence: Secondary | ICD-10-CM

## 2017-02-11 DIAGNOSIS — Z130 Encounter for screening for diseases of the blood and blood-forming organs and certain disorders involving the immune mechanism: Secondary | ICD-10-CM | POA: Diagnosis not present

## 2017-02-11 DIAGNOSIS — Z1329 Encounter for screening for other suspected endocrine disorder: Secondary | ICD-10-CM | POA: Diagnosis not present

## 2017-02-11 DIAGNOSIS — Z131 Encounter for screening for diabetes mellitus: Secondary | ICD-10-CM

## 2017-02-11 LAB — COMPREHENSIVE METABOLIC PANEL
ALK PHOS: 72 U/L (ref 39–117)
ALT: 16 U/L (ref 0–35)
AST: 17 U/L (ref 0–37)
Albumin: 4.5 g/dL (ref 3.5–5.2)
BUN: 9 mg/dL (ref 6–23)
CO2: 31 mEq/L (ref 19–32)
Calcium: 9.5 mg/dL (ref 8.4–10.5)
Chloride: 104 mEq/L (ref 96–112)
Creatinine, Ser: 1.18 mg/dL (ref 0.40–1.20)
GFR: 63.69 mL/min (ref >60.00)
Glucose, Bld: 86 mg/dL (ref 70–99)
POTASSIUM: 4.2 meq/L (ref 3.5–5.1)
SODIUM: 139 meq/L (ref 135–145)
TOTAL PROTEIN: 7.1 g/dL (ref 6.0–8.3)
Total Bilirubin: 0.3 mg/dL (ref 0.2–1.2)

## 2017-02-11 LAB — TSH: TSH: 1.78 u[IU]/mL (ref 0.35–4.50)

## 2017-02-11 LAB — CBC
HEMATOCRIT: 40.4 % (ref 36.0–46.0)
Hemoglobin: 13.1 g/dL (ref 12.0–15.0)
MCHC: 32.4 g/dL (ref 30.0–36.0)
MCV: 82.9 fl (ref 78.0–100.0)
Platelets: 308 10*3/uL (ref 150.0–400.0)
RBC: 4.87 Mil/uL (ref 3.87–5.11)
RDW: 13.4 % (ref 11.5–15.5)
WBC: 5.3 10*3/uL (ref 4.0–10.5)

## 2017-02-11 LAB — LIPID PANEL
Cholesterol: 172 mg/dL (ref 0–200)
HDL: 39.9 mg/dL (ref >39.00)
LDL CALC: 115 mg/dL — AB (ref 0–99)
NONHDL: 131.64
Total CHOL/HDL Ratio: 4
Triglycerides: 82 mg/dL (ref 0.0–149.0)
VLDL: 16.4 mg/dL (ref 0.0–40.0)

## 2017-02-11 LAB — HEMOGLOBIN A1C: Hgb A1c MFr Bld: 5.4 % (ref 4.6–6.5)

## 2017-02-11 LAB — VITAMIN D 25 HYDROXY (VIT D DEFICIENCY, FRACTURES): VITD: 14.82 ng/mL — ABNORMAL LOW (ref 30.00–100.00)

## 2017-02-11 MED ORDER — VITAMIN D (ERGOCALCIFEROL) 1.25 MG (50000 UNIT) PO CAPS: 50000.0000 [IU] | ORAL_CAPSULE | ORAL | 0 refills | Status: DC

## 2017-02-11 NOTE — Patient Instructions (Signed)
It was nice to see you today- take care and I will be in touch with your lab results asap  We can then plan our next visit- perhaps in 4-6 months

## 2017-02-11 NOTE — Progress Notes (Signed)
[place] at [facility] [address], [place], [place] [address] [phone]  Date:  02/11/2017   Name:  Megan Gonzalez   DOB:  June 25, 1971   MRN:  [id number]  PCP:  Lamar Blinks, MD    Chief Complaint: Establish Care (Pt here for transition care visit. c/o heavy nosebleeds and dizziness off and on x 3-4 months. )   History of Present Illness:  Megan Gonzalez is a 46 y.o. very pleasant female patient who presents with the following:  Here to establish care in this office today.  Previous patient of Dr. Doug Sou at [facility].  Patient lives in [facility] and desires a provider in [place].  Has seen Dr. Linus Mako with [facility] in [place], [place], for abdominal/pelvic pain, incontinence.  Last seen on 01-11-2017.  Has bladder stimulator (has been in since June 2015).  She feels like her bowel and bladder issues started after the birth of her 57 yo son- it was a difficult birth and he was a large baby.    No urinary incontinence, since they turned up her bladder stimulator on 01-11-2017. Having bowel incontinence and wearing adult diapers.  Does not even feel when she has to have a bowel movement.  She if frustrated about this.   She is not able to work due to this bowel incontinence.  Dr. Benson Norway (GI) has the impression the bladder stimulator is causing the bowel incontinence.  Per patient, Dr. Benson Norway said that next option would be a colostomy, but the patient does not want to do this.  Seen in this office by Mackie Pai, PA for epigastric/abdominal pain in June 2017. Is taking Zantac and helps with Epigastric pain.  Sees Dr. Enid Derry @ [facility], for management of her CPAP.  Has been having heavy nosebleeds lately.  Last nosebleed on Friday, 02-05-2017.  Has had 3 nosebleeds in the last 30 days. It may take her 10- 15 minutes to get the bleed under control.  She is not sure which side of her nose the blood  was coming from She is not on any blood thinners No other unusual bleeding or bruising  Has noticed intermittent dizziness for the last month.  Gets "woosy" feeling after consuming butter.  She is trying to reduce her butter and oil intake.    Thinks that she has had palpitations for about 1 year, but thinks that it is due to her bladder stimulator. She did see cardiology a couple of years ago- had a negative stress test in 2015 per Romelle Starcher  Thinks that she had a tetanus shot on 2011. She likely did have a Tdap when her son was born- will document for her  She is fasting today and would like to have complete labs She would also like a HD placard which I will provide for her  Patient Active Problem List   Diagnosis Date Noted  . Pain of left calf 10/25/2014  . History of treatment by mental health professional 10/02/2014  . Atypical chest pain 08/09/2014  . Routine general medical examination at a health care facility 08/09/2014  . Constipation, postoperative 11/19/2013  . Pelvic pain in female 11/17/2013  . Back pain 02/03/2012    Past Medical History:  Diagnosis Date  . Anemia   . Anxiety   . Arthritis    chronic low back pain  . Cancer ([place])   . Depression   . Fecal incontinence   . Headache(784.0)   .  Overactive bladder   . Sleep apnea    CPAP-non compliant    Past Surgical History:  Procedure Laterality Date  . ABDOMINAL HYSTERECTOMY    . bladder neurostimulator    . COLONOSCOPY  1999  . DILATION AND CURETTAGE OF UTERUS    . FOOT SURGERY  2006   left  . INCISION AND DRAINAGE ABSCESS N/A 11/19/2013   Procedure: POSTERIOR CULPOTOMY (INCISION AND DRAINAGE OF POST OPERATIVE CUFF HEMATOMA);  Surgeon: Jonnie Kind, MD;  Location: [facility];  Service: Gynecology;  Laterality: N/A;  . PUBOVAGINAL SLING  03/24/2012   Procedure: Gaynelle Arabian;  Surgeon: Ailene Rud, MD;  Location: [facility];  Service: Urology;  Laterality: N/A;  lynx with Chemical engineer   .  REMOVAL OF URINARY SLING    . SVD     x 3  . TONSILLECTOMY  as child  . URETERAL EXPLORATION  5./30/13  . VAGINAL HYSTERECTOMY N/A 1/2/015   [name], Dr Linus Mako and Dr Leory Plowman, with release of Pubovag sling    Social History  Substance Use Topics  . Smoking status: Never Smoker  . Smokeless tobacco: Never Used  . Alcohol use No    Family History  Problem Relation Age of Onset  . Cancer Mother     Breast cancer  . Thyroid disease Mother   . Heart disease Father   . Anesthesia problems Neg Hx     Allergies  Allergen Reactions  . Codeine Nausea And Vomiting  . Penicillins Other (See Comments)    Unknown- childhood reaction    Medication list has been reviewed and updated.  Current Outpatient Prescriptions on File Prior to Visit  Medication Sig Dispense Refill  . buPROPion (WELLBUTRIN XL) 300 MG 24 hr tablet TK 1 T PO D  2  . cyclobenzaprine (FLEXERIL) 10 MG tablet Take 10 mg by mouth 3 (three) times daily as needed for muscle spasms.    Marland Kitchen dicyclomine (BENTYL) 20 MG tablet Take 1 tablet (20 mg total) by mouth 2 (two) times daily. 20 tablet 0  . estradiol (VIVELLE-DOT) 0.025 MG/24HR Place 1 patch onto the skin 2 (two) times a week.    . fluticasone (FLONASE) 50 MCG/ACT nasal spray Place into both nostrils daily.    Marland Kitchen gabapentin (NEURONTIN) 300 MG capsule TAKE 1 CAPSULE BY MOUTH THREE TIMES DAILY 90 capsule 0  . ibuprofen (ADVIL,MOTRIN) 200 MG tablet Take 600 mg by mouth every 6 (six) hours as needed for moderate pain.    Marland Kitchen lidocaine (LIDODERM) 5 % Place 1 patch onto the skin daily. Remove & Discard patch within 12 hours or as directed by MD    . Rolan Lipa 145 MCG CAPS capsule Take 145 mcg by mouth daily.     . methocarbamol (ROBAXIN) 750 MG tablet Take 750 mg by mouth 3 (three) times daily.     Marland Kitchen oxybutynin (DITROPAN) 5 MG tablet Take 5 mg by mouth 3 (three) times daily.    . prazosin (MINIPRESS) 2 MG capsule Take 2 mg by mouth at bedtime.    . ranitidine (ZANTAC)  150 MG capsule Take 1 capsule (150 mg total) by mouth 2 (two) times daily. 60 capsule 0   No current facility-administered medications on file prior to visit.     Review of Systems:  Review of Systems  Respiratory: Negative for shortness of breath.   Gastrointestinal: Positive for abdominal pain and heartburn. Negative for constipation.       Positive for bowel incontinence  Genitourinary:       Positive for implanted bladder stimulator.     Physical Examination: Vitals:   02/11/17 0952  BP: 130/86  Pulse: 88  Temp: 98.1 F (36.7 C)   Vitals:   02/11/17 0952  Weight: 156 lb 6.4 oz (70.9 kg)  Height: 5\' 2"  (1.575 m)   Body mass index is 28.61 kg/m. Ideal Body Weight: Weight in (lb) to have BMI = 25: 136.4  GEN: WDWN, NAD, Non-toxic, A & O x 3, mild overweight, otherwise looks well HEENT: Atraumatic, Normocephalic. Neck supple. No masses, No LAD.  Bilateral TM wnl, oropharynx normal.  PEERL,EOMI.   No evidence of acute bleeding source on nasal exam Ears and Nose: No external deformity. CV: RRR, No M/G/R. No JVD. No thrill. No extra heart sounds. PULM: CTA B, no wheezes, crackles, rhonchi. No retractions. No resp. distress. No accessory muscle use. ABD: S, NT, ND. No rebound. No HSM. Stimulator is palpable left flank EXTR: No c/c/e NEURO Normal gait.  PSYCH: Normally interactive. Conversant. Not depressed or anxious appearing.  Calm demeanor.    Assessment and Plan:  Urinary incontinence, unspecified type  Screening for thyroid disorder - Plan: TSH  Screening for deficiency anemia - Plan: CBC  Screening for diabetes mellitus - Plan: Comprehensive metabolic panel, Hemoglobin A1c  Screening for hyperlipidemia - Plan: Lipid panel  Encounter for vitamin deficiency screening - Plan: Vitamin D (25 hydroxy)  Here today to establish care and go over some of her chronic health issues Did HD placard for her today Await labs and will be in touch with these results for  her  Signed Lamar Blinks, MD  Received her labs   Results for orders placed or performed in visit on 02/11/17  CBC  Result Value Ref Range   WBC 5.3 4.0 - 10.5 K/uL   RBC 4.87 3.87 - 5.11 Mil/uL   Platelets 308.0 150.0 - 400.0 K/uL   Hemoglobin 13.1 12.0 - 15.0 g/dL   HCT 40.4 36.0 - 46.0 %   MCV 82.9 78.0 - 100.0 fl   MCHC 32.4 30.0 - 36.0 g/dL   RDW 13.4 11.5 - 15.5 %  Comprehensive metabolic panel  Result Value Ref Range   Sodium 139 135 - 145 mEq/L   Potassium 4.2 3.5 - 5.1 mEq/L   Chloride 104 96 - 112 mEq/L   CO2 31 19 - 32 mEq/L   Glucose, Bld 86 70 - 99 mg/dL   BUN 9 6 - 23 mg/dL   Creatinine, Ser 1.18 0.40 - 1.20 mg/dL   Total Bilirubin 0.3 0.2 - 1.2 mg/dL   Alkaline Phosphatase 72 39 - 117 U/L   AST 17 0 - 37 U/L   ALT 16 0 - 35 U/L   Total Protein 7.1 6.0 - 8.3 g/dL   Albumin 4.5 3.5 - 5.2 g/dL   Calcium 9.5 8.4 - 10.5 mg/dL   GFR 63.69 >60.00 mL/min  Lipid panel  Result Value Ref Range   Cholesterol 172 0 - 200 mg/dL   Triglycerides 82.0 0.0 - 149.0 mg/dL   HDL 39.90 >39.00 mg/dL   VLDL 16.4 0.0 - 40.0 mg/dL   LDL Cholesterol 115 (H) 0 - 99 mg/dL   Total CHOL/HDL Ratio 4    NonHDL 131.64   TSH  Result Value Ref Range   TSH 1.78 0.35 - 4.50 uIU/mL  Hemoglobin A1c  Result Value Ref Range   Hgb A1c MFr Bld 5.4 4.6 - 6.5 %  Vitamin D (25  hydroxy)  Result Value Ref Range   VITD 14.82 (L) 30.00 - 100.00 ng/mL   Message to pt- will send in rx for vitamin D for her.  Otherwise labs look fine

## 2017-03-22 DIAGNOSIS — G2581 Restless legs syndrome: Secondary | ICD-10-CM | POA: Diagnosis not present

## 2017-03-22 DIAGNOSIS — G4733 Obstructive sleep apnea (adult) (pediatric): Secondary | ICD-10-CM | POA: Diagnosis not present

## 2017-04-06 DIAGNOSIS — G4733 Obstructive sleep apnea (adult) (pediatric): Secondary | ICD-10-CM | POA: Diagnosis not present

## 2017-06-15 NOTE — Progress Notes (Signed)
[place] at [facility] [address], [place], [place] [address] [phone]  Date:  06/17/2017   Name:  Megan Gonzalez   DOB:  1971/05/18   MRN:  [id number]  PCP:  Darreld Mclean, MD    Chief Complaint: Follow-up (Pt states she uses a CPAP machine and was told by her husband that she's been "jerking in her sleep" several nights. Pt feels tired when she wakes up and wants to discuss with Dr) and Headache (Pt states that 2 weeks ago she started having "sharp pains in her head where it feels like her head is about to crack open" )   History of Present Illness:  Megan Gonzalez is a 46 y.o. very pleasant female patient who presents with the following:  Seen here to establish care about 4 months ago  Has seen Dr. Linus Mako with [facility] in [place], [place], for abdominal/pelvic pain, incontinence.  Last seen on 01-11-2017.  Has bladder stimulator (has been in since June 2015).  She feels like her bowel and bladder issues started after the birth of her 34 yo son- it was a difficult birth and he was a large baby.    No urinary incontinence, since they turned up her bladder stimulator on 01-11-2017. Having bowel incontinence and wearing adult diapers.  Does not even feel when she has to have a bowel movement.  She if frustrated about this.   She is not able to work due to this bowel incontinence.  Dr. Benson Norway (GI) has the impression the bladder stimulator is causing the bowel incontinence.  Per patient, Dr. Benson Norway said that next option would be a colostomy, but the patient does not want to do this.  Seen in this office by Mackie Pai, PA for epigastric/abdominal pain in June 2017. Is taking Zantac and helps with Epigastric pain.  Sees Dr. Enid Derry @ [facility], for management of her CPAP.  Has been having heavy nosebleeds lately.  Last nosebleed on Friday, 02-05-2017.  Has had 3 nosebleeds in the last 30 days. It  may take her 10- 15 minutes to get the bleed under control.  She is not sure which side of her nose the blood was coming from She is not on any blood thinners No other unusual bleeding or bruising  Has noticed intermittent dizziness for the last month.  Gets "woosy" feeling after consuming butter.  She is trying to reduce her butter and oil intake.    Thinks that she has had palpitations for about 1 year, but thinks that it is due to her bladder stimulator. She did see cardiology a couple of years ago- had a negative stress test in 2015 per Romelle Starcher  We treated her with rx vitamin D after last labs- would like to recheck for her at next labs Otherwise recent labs looked fine  She is here today for a follow-up visit Her husband started to notice her "jerking" in her sleep a couple of weeks ago.   She sees her MD at [facility] for her sleep; asked her to discuss this further with her sleep medicine doctor  She has also noted headaches over the last couple of weeks.  She will feel like her head is "smushing in," she will take ibuprofen and this does help She may get the HA every couple of days, occurs most often in the middle of the day Resting in bed for a little while helps No vomiting or  fever HA may last for an hour or two  She does not have any visual change when she has the HA She may have some tinnitus off an on which is relatively new- however not necessarily associated with HA  BP Readings from Last 3 Encounters:  06/17/17 (!) 141/88  02/11/17 130/86  04/09/16 116/80   Wt Readings from Last 3 Encounters:  06/17/17 158 lb (71.7 kg)  02/11/17 156 lb 6.4 oz (70.9 kg)  04/09/16 155 lb (70.3 kg)      Patient Active Problem List   Diagnosis Date Noted  . Pain of left calf 10/25/2014  . History of treatment by mental health professional 10/02/2014  . Atypical chest pain 08/09/2014  . Routine general medical examination at a health care facility 08/09/2014  . Constipation,  postoperative 11/19/2013  . Pelvic pain in female 11/17/2013  . Back pain 02/03/2012    Past Medical History:  Diagnosis Date  . Anemia   . Anxiety   . Arthritis    chronic low back pain  . Cancer ([place])   . Depression   . Fecal incontinence   . Headache(784.0)   . Overactive bladder   . Sleep apnea    CPAP-non compliant    Past Surgical History:  Procedure Laterality Date  . ABDOMINAL HYSTERECTOMY    . bladder neurostimulator    . COLONOSCOPY  1999  . DILATION AND CURETTAGE OF UTERUS    . FOOT SURGERY  2006   left  . INCISION AND DRAINAGE ABSCESS N/A 11/19/2013   Procedure: POSTERIOR CULPOTOMY (INCISION AND DRAINAGE OF POST OPERATIVE CUFF HEMATOMA);  Surgeon: Jonnie Kind, MD;  Location: [facility];  Service: Gynecology;  Laterality: N/A;  . PUBOVAGINAL SLING  03/24/2012   Procedure: Gaynelle Arabian;  Surgeon: Ailene Rud, MD;  Location: [facility];  Service: Urology;  Laterality: N/A;  lynx with Chemical engineer   . REMOVAL OF URINARY SLING    . SVD     x 3  . TONSILLECTOMY  as child  . URETERAL EXPLORATION  5./30/13  . VAGINAL HYSTERECTOMY N/A 1/2/015   [name], Dr Linus Mako and Dr Leory Plowman, with release of Pubovag sling    Social History  Substance Use Topics  . Smoking status: Never Smoker  . Smokeless tobacco: Never Used  . Alcohol use No    Family History  Problem Relation Age of Onset  . Cancer Mother        Breast cancer  . Thyroid disease Mother   . Heart disease Father   . Anesthesia problems Neg Hx     Allergies  Allergen Reactions  . Codeine Nausea And Vomiting  . Penicillins Other (See Comments)    Unknown- childhood reaction    Medication list has been reviewed and updated.  Current Outpatient Prescriptions on File Prior to Visit  Medication Sig Dispense Refill  . amphetamine-dextroamphetamine (ADDERALL) 7.5 MG tablet Take 5 mg by mouth daily.     Marland Kitchen buPROPion (WELLBUTRIN XL) 300 MG 24 hr tablet TK 1 T PO D  2  .  cyclobenzaprine (FLEXERIL) 10 MG tablet Take 10 mg by mouth 3 (three) times daily as needed for muscle spasms.    Marland Kitchen estradiol (VIVELLE-DOT) 0.025 MG/24HR Place 1 patch onto the skin 2 (two) times a week.    . fluticasone (FLONASE) 50 MCG/ACT nasal spray Place into both nostrils daily.    Marland Kitchen gabapentin (NEURONTIN) 300 MG capsule TAKE 1 CAPSULE BY MOUTH THREE TIMES DAILY 90  capsule 0  . ibuprofen (ADVIL,MOTRIN) 200 MG tablet Take 600 mg by mouth every 6 (six) hours as needed for moderate pain.    Marland Kitchen LINZESS 145 MCG CAPS capsule Take 145 mcg by mouth daily.     . methocarbamol (ROBAXIN) 750 MG tablet Take 750 mg by mouth 3 (three) times daily.     Marland Kitchen oxybutynin (DITROPAN) 5 MG tablet Take 5 mg by mouth 3 (three) times daily.    . prazosin (MINIPRESS) 2 MG capsule Take 2 mg by mouth at bedtime.    . ranitidine (ZANTAC) 150 MG capsule Take 1 capsule (150 mg total) by mouth 2 (two) times daily. 60 capsule 0  . Vitamin D, Ergocalciferol, (DRISDOL) 50000 units CAPS capsule Take 1 capsule (50,000 Units total) by mouth every 7 (seven) days. 12 capsule 0   No current facility-administered medications on file prior to visit.     Review of Systems:  As per HPI- otherwise negative. No nausea, vomiting or diarrhea No fever or chills No CP or SOB No rash    Physical Examination: Vitals:   06/17/17 0924  BP: (!) 141/88  Pulse: 82  Resp: 18  Temp: 98.6 F (37 C)  SpO2: 98%   Vitals:   06/17/17 0924  Weight: 158 lb (71.7 kg)  Height: 5\' 2"  (1.575 m)   Body mass index is 28.9 kg/m. Ideal Body Weight: Weight in (lb) to have BMI = 25: 136.4  GEN: WDWN, NAD, Non-toxic, A & O x 3, looks well, mild overweight HEENT: Atraumatic, Normocephalic. Neck supple. No masses, No LAD. Ears and Nose: No external deformity. CV: RRR, No M/G/R. No JVD. No thrill. No extra heart sounds. PULM: CTA B, no wheezes, crackles, rhonchi. No retractions. No resp. distress. No accessory muscle use. ABD: S, NT, ND, +BS.  No rebound. No HSM. EXTR: No c/c/e NEURO Normal gait.  PSYCH: Normally interactive. Conversant. Not depressed or anxious appearing.  Calm demeanor.    Assessment and Plan: Vitamin D deficiency - Plan: Vitamin D (25 hydroxy)  Frequent headaches  Here today to follow-up on vitamin D deficiency s/p treatment Also discussed headaches her sx are rather vague. Offered further evaluation at this time- she prefers to see if her sx do not go away on their own  Asked her to please discuss her sleep concerns with her sleep doctor and she agrees to do so Will plan further follow- up pending labs.   Signed Lamar Blinks, MD

## 2017-06-17 ENCOUNTER — Ambulatory Visit (INDEPENDENT_AMBULATORY_CARE_PROVIDER_SITE_OTHER): Payer: 59 | Admitting: Family Medicine

## 2017-06-17 ENCOUNTER — Encounter: Payer: Self-pay | Admitting: Family Medicine

## 2017-06-17 VITALS — BP 141/88 | HR 82 | Temp 98.6°F | Resp 18 | Ht 62.0 in | Wt 158.0 lb

## 2017-06-17 DIAGNOSIS — R51 Headache: Secondary | ICD-10-CM

## 2017-06-17 DIAGNOSIS — E559 Vitamin D deficiency, unspecified: Secondary | ICD-10-CM | POA: Diagnosis not present

## 2017-06-17 DIAGNOSIS — R519 Headache, unspecified: Secondary | ICD-10-CM

## 2017-06-17 LAB — VITAMIN D 25 HYDROXY (VIT D DEFICIENCY, FRACTURES): VITD: 38.34 ng/mL (ref 30.00–100.00)

## 2017-06-17 NOTE — Patient Instructions (Addendum)
We will get your vitamin D level for you today- I will be in touch with your result asap Please discuss your sleep concerns with Dr. Ellouise Newer at [facility] If your headaches do not resolve please do let me know- Sooner if worse.

## 2017-06-21 ENCOUNTER — Other Ambulatory Visit: Payer: Self-pay | Admitting: Family Medicine

## 2017-06-21 DIAGNOSIS — E559 Vitamin D deficiency, unspecified: Secondary | ICD-10-CM

## 2017-06-21 NOTE — Telephone Encounter (Signed)
Pt completed 12 week Ergocalciferol course, please advise.

## 2017-06-22 ENCOUNTER — Telehealth: Payer: Self-pay | Admitting: Family Medicine

## 2017-06-22 ENCOUNTER — Encounter: Payer: Self-pay | Admitting: Family Medicine

## 2017-06-22 DIAGNOSIS — G4452 New daily persistent headache (NDPH): Secondary | ICD-10-CM

## 2017-06-22 NOTE — Telephone Encounter (Signed)
Patient Name: Megan Gonzalez  DOB: [birth year]    Initial Comment Caller states she's having headaches and having balance problems.   Nurse Assessment  Nurse: Raphael Gibney, RN, Vanita Ingles Date/Time (Eastern Time): 06/22/2017 2:26:30 PM  Confirm and document reason for call. If symptomatic, describe symptoms. ---Caller states she has been having headaches. Has a problem with balance. Her ankle rolled today while she was walking like she did not have any control with her legs. She fell onto her knees. she has been lightheaded. She feels "out of it". Wants a referral to the neurologist  Does the patient have any new or worsening symptoms? ---Yes  Will a triage be completed? ---Yes  Related visit to physician within the last 2 weeks? ---Yes  Does the PT have any chronic conditions? (i.e. diabetes, asthma, etc.) ---Yes  List chronic conditions. ---nerve implant  Is the patient pregnant or possibly pregnant? (Ask all females between the ages of 66-55) ---No  Is this a behavioral health or substance abuse call? ---No     Guidelines    Guideline Title Affirmed Question Affirmed Notes  Headache Loss of vision or double vision (Exception: same as prior migraines)    Final Disposition User   Go to ED Now (or PCP triage) Raphael Gibney, RN, Vanita Ingles    Comments  pt states she does not want to go to the ER but wants a referral to the neurologist.  called office and spoke to [facility] and gave report that pt has been having headaches and problems with balance. Saw Dr. Lorelei Pont last Thursday and was told to call back for neurologist referral if it continued. Today, she has had headaches and dizziness. She was walking and felt like she did not have control of her legs. her ankle rolled and she fell.  triage outcome of go to ER (or PCP triage) but pt does not want to go to the ER.  states she is having vision problems even with her glasses prescription having been changed.   Referrals  GO TO FACILITY REFUSED    Disagree/Comply: Disagree  Disagree/Comply Reason: Disagree with instructions

## 2017-06-22 NOTE — Telephone Encounter (Signed)
Pt called in because she said that she has still been experiencing headaches. She and her provider discussed in her last visit. Pt says that she would like to have a referral placed to a neurologist because she is having some balancing concerns. Pt said that she stood up and couldn't catch her balance.    Transferred call to team health for triaging.

## 2017-06-23 NOTE — Telephone Encounter (Signed)
Provider responded to patient via My Chart Message on 06/22/17: SLS 08/29 Patient Email Open   06/22/2017 [place] at [place], MD  Family Medicine   Conversation: headaches  (Newest Message First)  Copland, Gay Filler, MD  to Megan Gonzalez       06/22/17 7:46 PM  Hi Megan Gonzalez,  I placed a referral to neurology for you. However, it may take several weeks for you to get in. If you are not doing ok in the meantime please come back and see [place] or go to the ER if necessary  JC    This MyChart message has not been read.

## 2017-07-07 DIAGNOSIS — G4733 Obstructive sleep apnea (adult) (pediatric): Secondary | ICD-10-CM | POA: Diagnosis not present

## 2017-07-26 ENCOUNTER — Ambulatory Visit (INDEPENDENT_AMBULATORY_CARE_PROVIDER_SITE_OTHER): Payer: 59 | Admitting: Neurology

## 2017-07-26 ENCOUNTER — Encounter: Payer: Self-pay | Admitting: Neurology

## 2017-07-26 VITALS — BP 137/89 | HR 86 | Ht 62.0 in | Wt 154.5 lb

## 2017-07-26 DIAGNOSIS — G43709 Chronic migraine without aura, not intractable, without status migrainosus: Secondary | ICD-10-CM | POA: Diagnosis not present

## 2017-07-26 DIAGNOSIS — R269 Unspecified abnormalities of gait and mobility: Secondary | ICD-10-CM | POA: Diagnosis not present

## 2017-07-26 DIAGNOSIS — M545 Low back pain, unspecified: Secondary | ICD-10-CM | POA: Insufficient documentation

## 2017-07-26 DIAGNOSIS — G8929 Other chronic pain: Secondary | ICD-10-CM | POA: Diagnosis not present

## 2017-07-26 DIAGNOSIS — IMO0002 Reserved for concepts with insufficient information to code with codable children: Secondary | ICD-10-CM | POA: Insufficient documentation

## 2017-07-26 MED ORDER — RIZATRIPTAN BENZOATE 5 MG PO TBDP: 5.0000 mg | ORAL_TABLET | ORAL | 11 refills | Status: DC | PRN

## 2017-07-26 MED ORDER — NORTRIPTYLINE HCL 10 MG PO CAPS: 20.0000 mg | ORAL_CAPSULE | Freq: Every day | ORAL | 11 refills | Status: DC

## 2017-07-26 NOTE — Progress Notes (Signed)
PATIENT: Megan Gonzalez DOB: 06-26-1971  Chief Complaint  Patient presents with  . Numbness    She has numbness in her right hand constantly and occasionally in her bilateral legs.  Says her right hand feels weaker than the left.  . Tremors    Reports intermittent tremors on right side of body for 3+ years.    . Headache    She has at least three headaches per week.  She lays down to rest and takes ibuprofen 600mg  to treat her pain.  Marland Kitchen PCP    Copland, Gay Filler, MD     HISTORICAL  Megan Gonzalez is a 46 years old female, seen in refer by her primary care doctor Copland, Gay Filler for evaluation of tremor, headache, initial evaluation was July 26 2017  Reviewed and summarized the referring note, she has past medical history of pelvic floor damage from childbirth in 2011, require pelvic surgery, eventually interStim placement by [facility] pelvic reconstruction surgeon Dr. Linus Mako in 2015,, I was able to review his note from January 11 2017, Patient has urinary and fecal incontinence, chronic pelvic pain, long history of chronic constipation, followed by GI specialist Dr. Ronalee Red.  She continue has chronic low back pain, radiating pain to right hip, right leg, she require intermittent adjustment of her interStim, following her adjustment, she also has transient body shaking, especially her right side, then gradually subsided,  Since summer of 2018, she reported episode of sudden right leg give out underneath her while ambulating, she has chronic mild unsteady gait due to low back pain, right hip pain, but she denies persistent right lower extremity sensory loss or weakness.  She had a history of migraine headaches getting worse since June 2018, she complains of right lateralized severe pounding headache with associated light noise sensitivity, she has to lie down in dark quiet room for couple hours, it happened about 3 times a week, ibuprofen and sleep helps her headache  MRI of the  brain was normal in 2015   CT of abdomen in May 2015 showed small bilateral ovarian cyst, no other significant abnormality of abdomen no pelvic identified  MRI of lumbar in 2012 mild degenerative changes at L4-L5, L5-S1 without significant canal or foraminal stenosis   REVIEW OF SYSTEMS: Full 14 system review of systems performed and notable only for as above  ALLERGIES: Allergies  Allergen Reactions  . Codeine Nausea And Vomiting  . Penicillins Other (See Comments)    Unknown- childhood reaction    HOME MEDICATIONS: Current Outpatient Prescriptions  Medication Sig Dispense Refill  . amphetamine-dextroamphetamine (ADDERALL) 7.5 MG tablet Take 5 mg by mouth daily.     Marland Kitchen buPROPion (WELLBUTRIN XL) 300 MG 24 hr tablet TK 1 T PO D  2  . cyclobenzaprine (FLEXERIL) 10 MG tablet Take 10 mg by mouth 3 (three) times daily as needed for muscle spasms.    Marland Kitchen estradiol (VIVELLE-DOT) 0.025 MG/24HR Place 1 patch onto the skin 2 (two) times a week.    . fluticasone (FLONASE) 50 MCG/ACT nasal spray Place into both nostrils daily.    Marland Kitchen gabapentin (NEURONTIN) 300 MG capsule TAKE 1 CAPSULE BY MOUTH THREE TIMES DAILY 90 capsule 0  . ibuprofen (ADVIL,MOTRIN) 200 MG tablet Take 600 mg by mouth every 6 (six) hours as needed for moderate pain.    Marland Kitchen LINZESS 145 MCG CAPS capsule Take 145 mcg by mouth daily.     . methocarbamol (ROBAXIN) 750 MG tablet Take 750 mg by mouth 3 (  three) times daily.     Marland Kitchen oxybutynin (DITROPAN) 5 MG tablet Take 5 mg by mouth 3 (three) times daily.    . prazosin (MINIPRESS) 2 MG capsule Take 2 mg by mouth at bedtime.    . ranitidine (ZANTAC) 150 MG capsule Take 1 capsule (150 mg total) by mouth 2 (two) times daily. 60 capsule 0  . Vitamin D, Ergocalciferol, (DRISDOL) 50000 units CAPS capsule Take 1 capsule (50,000 Units total) by mouth every 7 (seven) days. 12 capsule 0   No current facility-administered medications for this visit.     PAST MEDICAL HISTORY: Past Medical History:    Diagnosis Date  . Anemia   . Anxiety   . Arthritis    chronic low back pain  . Cancer ([place])   . Depression   . Fecal incontinence   . Headache(784.0)   . Numbness   . Overactive bladder   . Sleep apnea    CPAP-non compliant  . Tremor     PAST SURGICAL HISTORY: Past Surgical History:  Procedure Laterality Date  . ABDOMINAL HYSTERECTOMY    . bladder neurostimulator    . COLONOSCOPY  1999  . DILATION AND CURETTAGE OF UTERUS    . FOOT SURGERY  2006   left  . INCISION AND DRAINAGE ABSCESS N/A 11/19/2013   Procedure: POSTERIOR CULPOTOMY (INCISION AND DRAINAGE OF POST OPERATIVE CUFF HEMATOMA);  Surgeon: Jonnie Kind, MD;  Location: [facility];  Service: Gynecology;  Laterality: N/A;  . PUBOVAGINAL SLING  03/24/2012   Procedure: Gaynelle Arabian;  Surgeon: Ailene Rud, MD;  Location: [facility];  Service: Urology;  Laterality: N/A;  lynx with Chemical engineer   . REMOVAL OF URINARY SLING    . SVD     x 3  . TONSILLECTOMY  as child  . URETERAL EXPLORATION  5./30/13  . VAGINAL HYSTERECTOMY N/A 1/2/015   [facility], Dr Linus Mako and Dr Leory Plowman, with release of Pubovag sling    FAMILY HISTORY: Family History  Problem Relation Age of Onset  . Cancer Mother        Breast cancer  . Thyroid disease Mother   . Hypotension Mother   . Other Father        unsure of history  . Anesthesia problems Neg Hx     SOCIAL HISTORY:  Social History   Social History  . Marital status: Married    Spouse name: N/A  . Number of children: 3  . Years of education: Masters   Occupational History  . Tutoring part-time    Social History Main Topics  . Smoking status: Never Smoker  . Smokeless tobacco: Never Used  . Alcohol use No  . Drug use: No  . Sexual activity: Not Currently    Birth control/ protection: Surgical   Other Topics Concern  . Not on file   Social History Narrative   Lives at home with husband and children.   Right-handed.   2 cups caffeine per day.      PHYSICAL EXAM   Vitals:   07/26/17 0733  BP: 137/89  Pulse: 86  Weight: 154 lb 8 oz (70.1 kg)  Height: 5\' 2"  (1.575 m)    Not recorded      Body mass index is 28.26 kg/m.  PHYSICAL EXAMNIATION:  Gen: NAD, conversant, well nourised, obese, well groomed                     Cardiovascular: Regular rate rhythm, no  peripheral edema, warm, nontender. Eyes: Conjunctivae clear without exudates or hemorrhage Neck: Supple, no carotid bruits. Pulmonary: Clear to auscultation bilaterally   NEUROLOGICAL EXAM:  MENTAL STATUS: Speech:    Speech is normal; fluent and spontaneous with normal comprehension.  Cognition:     Orientation to time, place and person     Normal recent and remote memory     Normal Attention span and concentration     Normal Language, naming, repeating,spontaneous speech     Fund of knowledge   CRANIAL NERVES: CN II: Visual fields are full to confrontation. Fundoscopic exam is normal with sharp discs and no vascular changes. Pupils are round equal and briskly reactive to light. CN III, IV, VI: extraocular movement are normal. No ptosis. CN V: Facial sensation is intact to pinprick in all 3 divisions bilaterally. Corneal responses are intact.  CN VII: Face is symmetric with normal eye closure and smile. CN VIII: Hearing is normal to rubbing fingers CN IX, X: Palate elevates symmetrically. Phonation is normal. CN XI: Head turning and shoulder shrug are intact CN XII: Tongue is midline with normal movements and no atrophy.  MOTOR: There is no pronator drift of out-stretched arms. Muscle bulk and tone are normal. Muscle strength is normal.  REFLEXES: Reflexes are 2+ and symmetric at the biceps, triceps, knees, and ankles. Plantar responses are flexor.  SENSORY: Intact to light touch, pinprick, positional sensation and vibratory sensation are intact in fingers and toes.  COORDINATION: Rapid alternating movements and fine finger movements are intact.  There is no dysmetria on finger-to-nose and heel-knee-shin.    GAIT/STANCE: Posture is normal. Gait is steady with normal steps, base, arm swing, and turning. Heel and toe walking are normal. Tandem gait is normal.  Romberg is absent.   DIAGNOSTIC DATA (LABS, IMAGING, TESTING) - I reviewed patient records, labs, notes, testing and imaging myself where available.   ASSESSMENT AND PLAN  Gailya Tauer is a 46 y.o. female   Chronic migraine headaches Chronic low back pain,  Nortriptyline 10mg  titrating to 20mg  qhs as preventive medication.  Maxalt prn.     Marcial Pacas, M.D. Ph.D.  [facility] [address], [place], [place] [address] Ph: [phone] Fax: [phone]  CC: Copland, Gay Filler, MD

## 2017-08-09 DIAGNOSIS — R102 Pelvic and perineal pain: Secondary | ICD-10-CM | POA: Diagnosis not present

## 2017-08-09 DIAGNOSIS — N760 Acute vaginitis: Secondary | ICD-10-CM | POA: Diagnosis not present

## 2017-08-10 DIAGNOSIS — Z1231 Encounter for screening mammogram for malignant neoplasm of breast: Secondary | ICD-10-CM | POA: Diagnosis not present

## 2017-08-20 DIAGNOSIS — R102 Pelvic and perineal pain: Secondary | ICD-10-CM | POA: Diagnosis not present

## 2017-08-20 DIAGNOSIS — R194 Change in bowel habit: Secondary | ICD-10-CM | POA: Diagnosis not present

## 2017-08-25 ENCOUNTER — Emergency Department (HOSPITAL_BASED_OUTPATIENT_CLINIC_OR_DEPARTMENT_OTHER): Payer: 59

## 2017-08-25 ENCOUNTER — Observation Stay (HOSPITAL_BASED_OUTPATIENT_CLINIC_OR_DEPARTMENT_OTHER)
Admission: EM | Admit: 2017-08-25 | Discharge: 2017-08-27 | Disposition: A | Discharge status: Home / Self Care | Payer: 59 | Attending: General Surgery | Admitting: General Surgery

## 2017-08-25 ENCOUNTER — Encounter (HOSPITAL_BASED_OUTPATIENT_CLINIC_OR_DEPARTMENT_OTHER): Payer: Self-pay

## 2017-08-25 DIAGNOSIS — N3281 Overactive bladder: Secondary | ICD-10-CM | POA: Diagnosis not present

## 2017-08-25 DIAGNOSIS — F419 Anxiety disorder, unspecified: Secondary | ICD-10-CM | POA: Insufficient documentation

## 2017-08-25 DIAGNOSIS — K3580 Unspecified acute appendicitis: Secondary | ICD-10-CM | POA: Diagnosis not present

## 2017-08-25 DIAGNOSIS — Z885 Allergy status to narcotic agent status: Secondary | ICD-10-CM | POA: Diagnosis not present

## 2017-08-25 DIAGNOSIS — R109 Unspecified abdominal pain: Secondary | ICD-10-CM | POA: Diagnosis present

## 2017-08-25 DIAGNOSIS — Z90710 Acquired absence of both cervix and uterus: Secondary | ICD-10-CM | POA: Diagnosis not present

## 2017-08-25 DIAGNOSIS — Z9119 Patient's noncompliance with other medical treatment and regimen: Secondary | ICD-10-CM | POA: Insufficient documentation

## 2017-08-25 DIAGNOSIS — R1110 Vomiting, unspecified: Secondary | ICD-10-CM | POA: Diagnosis not present

## 2017-08-25 DIAGNOSIS — R251 Tremor, unspecified: Secondary | ICD-10-CM | POA: Diagnosis not present

## 2017-08-25 DIAGNOSIS — M1990 Unspecified osteoarthritis, unspecified site: Secondary | ICD-10-CM | POA: Insufficient documentation

## 2017-08-25 DIAGNOSIS — Z23 Encounter for immunization: Secondary | ICD-10-CM | POA: Diagnosis not present

## 2017-08-25 DIAGNOSIS — G4730 Sleep apnea, unspecified: Secondary | ICD-10-CM | POA: Diagnosis not present

## 2017-08-25 DIAGNOSIS — Z79899 Other long term (current) drug therapy: Secondary | ICD-10-CM | POA: Insufficient documentation

## 2017-08-25 DIAGNOSIS — F329 Major depressive disorder, single episode, unspecified: Secondary | ICD-10-CM | POA: Insufficient documentation

## 2017-08-25 DIAGNOSIS — K5730 Diverticulosis of large intestine without perforation or abscess without bleeding: Secondary | ICD-10-CM | POA: Diagnosis not present

## 2017-08-25 DIAGNOSIS — Z880 Allergy status to penicillin: Secondary | ICD-10-CM | POA: Diagnosis not present

## 2017-08-25 DIAGNOSIS — R1084 Generalized abdominal pain: Secondary | ICD-10-CM | POA: Diagnosis not present

## 2017-08-25 HISTORY — PX: APPENDECTOMY: SHX54

## 2017-08-25 LAB — URINALYSIS, ROUTINE W REFLEX MICROSCOPIC
Bilirubin Urine: NEGATIVE
Glucose, UA: NEGATIVE mg/dL
Hgb urine dipstick: NEGATIVE
Ketones, ur: NEGATIVE mg/dL
LEUKOCYTES UA: NEGATIVE
NITRITE: NEGATIVE
PH: 6 (ref 5.0–8.0)
Protein, ur: NEGATIVE mg/dL
SPECIFIC GRAVITY, URINE: 1.02 (ref 1.005–1.030)

## 2017-08-25 LAB — COMPREHENSIVE METABOLIC PANEL
ALT: 16 U/L (ref 14–54)
AST: 17 U/L (ref 15–41)
Albumin: 4.5 g/dL (ref 3.5–5.0)
Alkaline Phosphatase: 61 U/L (ref 38–126)
Anion gap: 7 (ref 5–15)
BUN: 13 mg/dL (ref 6–20)
CHLORIDE: 105 mmol/L (ref 101–111)
CO2: 25 mmol/L (ref 22–32)
CREATININE: 1.28 mg/dL — AB (ref 0.44–1.00)
Calcium: 9 mg/dL (ref 8.9–10.3)
GFR, EST AFRICAN AMERICAN: 58 mL/min — AB (ref >60)
GFR, EST NON AFRICAN AMERICAN: 50 mL/min — AB (ref >60)
Glucose, Bld: 89 mg/dL (ref 65–99)
POTASSIUM: 3.8 mmol/L (ref 3.5–5.1)
Sodium: 137 mmol/L (ref 135–145)
Total Bilirubin: 0.2 mg/dL — ABNORMAL LOW (ref 0.3–1.2)
Total Protein: 7.1 g/dL (ref 6.5–8.1)

## 2017-08-25 LAB — CBC WITH DIFFERENTIAL/PLATELET
BASOS ABS: 0 10*3/uL (ref 0.0–0.1)
BASOS PCT: 0 %
Eosinophils Absolute: 0.1 10*3/uL (ref 0.0–0.7)
Eosinophils Relative: 1 %
HEMATOCRIT: 37.3 % (ref 36.0–46.0)
HEMOGLOBIN: 12.4 g/dL (ref 12.0–15.0)
LYMPHS PCT: 41 %
Lymphs Abs: 2.3 10*3/uL (ref 0.7–4.0)
MCH: 26.6 pg (ref 26.0–34.0)
MCHC: 33.2 g/dL (ref 30.0–36.0)
MCV: 80 fL (ref 78.0–100.0)
Monocytes Absolute: 0.4 10*3/uL (ref 0.1–1.0)
Monocytes Relative: 7 %
NEUTROS ABS: 2.8 10*3/uL (ref 1.7–7.7)
Neutrophils Relative %: 51 %
Platelets: 247 10*3/uL (ref 150–400)
RBC: 4.66 MIL/uL (ref 3.87–5.11)
RDW: 13 % (ref 11.5–15.5)
WBC: 5.7 10*3/uL (ref 4.0–10.5)

## 2017-08-25 LAB — LIPASE, BLOOD: LIPASE: 19 U/L (ref 11–51)

## 2017-08-25 MED ORDER — HYDROMORPHONE HCL 1 MG/ML IJ SOLN
0.5000 mg | INTRAMUSCULAR | Status: DC | PRN
  Administered 2017-08-26 (×2): 0.5 mg via INTRAVENOUS
  Filled 2017-08-25 (×2): qty 0.5

## 2017-08-25 MED ORDER — SODIUM CHLORIDE 0.9 % IV BOLUS (SEPSIS)
1000.0000 mL | Freq: Once | INTRAVENOUS | Status: AC
  Administered 2017-08-25: 1000 mL via INTRAVENOUS

## 2017-08-25 MED ORDER — TRAMADOL HCL 50 MG PO TABS: 50.0000 mg | ORAL_TABLET | Freq: Four times a day (QID) | ORAL | Status: DC | PRN

## 2017-08-25 MED ORDER — HYDRALAZINE HCL 20 MG/ML IJ SOLN: 10.0000 mg | INTRAMUSCULAR | Status: DC | PRN

## 2017-08-25 MED ORDER — DIPHENHYDRAMINE HCL 50 MG/ML IJ SOLN: 25.0000 mg | Freq: Four times a day (QID) | INTRAMUSCULAR | Status: DC | PRN

## 2017-08-25 MED ORDER — METRONIDAZOLE IN NACL 5-0.79 MG/ML-% IV SOLN
500.0000 mg | Freq: Three times a day (TID) | INTRAVENOUS | Status: DC
  Administered 2017-08-26: 500 mg via INTRAVENOUS
  Filled 2017-08-25 (×3): qty 100

## 2017-08-25 MED ORDER — ONDANSETRON HCL 4 MG/2ML IJ SOLN
4.0000 mg | Freq: Once | INTRAMUSCULAR | Status: AC
  Administered 2017-08-25: 4 mg via INTRAVENOUS
  Filled 2017-08-25: qty 2

## 2017-08-25 MED ORDER — DOCUSATE SODIUM 100 MG PO CAPS
100.0000 mg | ORAL_CAPSULE | Freq: Two times a day (BID) | ORAL | Status: DC
  Administered 2017-08-26 – 2017-08-27 (×2): 100 mg via ORAL
  Filled 2017-08-25 (×2): qty 1

## 2017-08-25 MED ORDER — METHOCARBAMOL 1000 MG/10ML IJ SOLN
500.0000 mg | Freq: Three times a day (TID) | INTRAVENOUS | Status: DC
  Administered 2017-08-26 (×2): 500 mg via INTRAVENOUS
  Filled 2017-08-25: qty 550
  Filled 2017-08-25: qty 5
  Filled 2017-08-25: qty 550

## 2017-08-25 MED ORDER — METRONIDAZOLE IN NACL 5-0.79 MG/ML-% IV SOLN
500.0000 mg | Freq: Once | INTRAVENOUS | Status: AC
Start: 2017-08-25 — End: 2017-08-26
  Administered 2017-08-25: 500 mg via INTRAVENOUS
  Filled 2017-08-25: qty 100

## 2017-08-25 MED ORDER — CEFTRIAXONE SODIUM 2 G IJ SOLR
2.0000 g | INTRAMUSCULAR | Status: DC
  Filled 2017-08-25: qty 2

## 2017-08-25 MED ORDER — METOPROLOL TARTRATE 5 MG/5ML IV SOLN: 5.0000 mg | Freq: Four times a day (QID) | INTRAVENOUS | Status: DC | PRN

## 2017-08-25 MED ORDER — ONDANSETRON HCL 4 MG/2ML IJ SOLN
4.0000 mg | Freq: Four times a day (QID) | INTRAMUSCULAR | Status: DC | PRN
  Administered 2017-08-26: 4 mg via INTRAVENOUS
  Filled 2017-08-25: qty 2

## 2017-08-25 MED ORDER — IOPAMIDOL (ISOVUE-300) INJECTION 61%
100.0000 mL | Freq: Once | INTRAVENOUS | Status: AC | PRN
  Administered 2017-08-25: 100 mL via INTRAVENOUS

## 2017-08-25 MED ORDER — PANTOPRAZOLE SODIUM 40 MG IV SOLR
40.0000 mg | Freq: Every day | INTRAVENOUS | Status: DC
  Administered 2017-08-26 (×2): 40 mg via INTRAVENOUS
  Filled 2017-08-25 (×2): qty 40

## 2017-08-25 MED ORDER — DIPHENHYDRAMINE HCL 25 MG PO CAPS: 25.0000 mg | ORAL_CAPSULE | Freq: Four times a day (QID) | ORAL | Status: DC | PRN

## 2017-08-25 MED ORDER — METHOCARBAMOL 500 MG PO TABS: 500.0000 mg | ORAL_TABLET | Freq: Four times a day (QID) | ORAL | Status: DC | PRN

## 2017-08-25 MED ORDER — BISACODYL 10 MG RE SUPP: 10.0000 mg | Freq: Every day | RECTAL | Status: DC | PRN

## 2017-08-25 MED ORDER — SODIUM CHLORIDE 0.9 % IV SOLN
INTRAVENOUS | Status: DC
  Administered 2017-08-26: 100 mL/h via INTRAVENOUS
  Administered 2017-08-26 – 2017-08-27 (×2): via INTRAVENOUS

## 2017-08-25 MED ORDER — ACETAMINOPHEN 500 MG PO TABS
1000.0000 mg | ORAL_TABLET | Freq: Four times a day (QID) | ORAL | Status: DC
  Administered 2017-08-26 – 2017-08-27 (×4): 1000 mg via ORAL
  Filled 2017-08-25 (×6): qty 2

## 2017-08-25 MED ORDER — ONDANSETRON 4 MG PO TBDP: 4.0000 mg | ORAL_TABLET | Freq: Four times a day (QID) | ORAL | Status: DC | PRN

## 2017-08-25 MED ORDER — DEXTROSE 5 % IV SOLN
2.0000 g | Freq: Once | INTRAVENOUS | Status: AC
  Administered 2017-08-25: 2 g via INTRAVENOUS
  Filled 2017-08-25: qty 2

## 2017-08-25 MED ORDER — OXYCODONE HCL 5 MG PO TABS
5.0000 mg | ORAL_TABLET | ORAL | Status: DC | PRN
  Administered 2017-08-26 – 2017-08-27 (×2): 5 mg via ORAL
  Filled 2017-08-25 (×2): qty 1

## 2017-08-25 MED ORDER — FENTANYL CITRATE (PF) 100 MCG/2ML IJ SOLN
100.0000 ug | Freq: Once | INTRAMUSCULAR | Status: AC | PRN
  Administered 2017-08-25: 100 ug via INTRAVENOUS
  Filled 2017-08-25: qty 2

## 2017-08-25 MED ORDER — HEPARIN SODIUM (PORCINE) 5000 UNIT/ML IJ SOLN
5000.0000 [IU] | Freq: Three times a day (TID) | INTRAMUSCULAR | Status: DC
  Administered 2017-08-26: 5000 [IU] via SUBCUTANEOUS
  Filled 2017-08-25: qty 1

## 2017-08-25 MED ORDER — FENTANYL CITRATE (PF) 100 MCG/2ML IJ SOLN
100.0000 ug | Freq: Once | INTRAMUSCULAR | Status: AC
  Administered 2017-08-25: 100 ug via INTRAVENOUS
  Filled 2017-08-25: qty 2

## 2017-08-25 NOTE — ED Notes (Signed)
Patient received from [facility]-Dr. Kae Heller paged to notify patient is here

## 2017-08-25 NOTE — ED Notes (Signed)
Hormone patch removed from right upper buttock area. Dr. Kae Heller at bedside evaluating patient

## 2017-08-25 NOTE — ED Notes (Signed)
ED Provider at bedside. 

## 2017-08-25 NOTE — ED Provider Notes (Signed)
[facility]PARTMENT Provider Note   CSN: [id number] Arrival date & time: 08/25/17  1759     History   Chief Complaint Chief Complaint  Patient presents with  . Abdominal Pain    HPI Megan Gonzalez is a 46 y.o. female.  HPI  46 year old female who is followed at [facility] due to a history of fecal and urinary incontinence presents with right lower quadrant pain for about 3 weeks.  She states that it has been constant for 3 weeks and over the last 3 days has been worsening.  It is constant throughout the day but does seem to get worse at certain times.  Sometimes standing and walking makes it worse and sometimes is worse on its own.  She saw her uro-gynecologist a couple weeks ago and they have ordered a pelvic ultrasound but it is not until the end of November.  She states the pain is worsening despite ibuprofen and Tylenol.  No new urinary symptoms such as dysuria or hematuria.  No current vaginal discharge although she did recently finished Diflucan for a yeast infection about a week ago.  She has had a hysterectomy but believes she still has her ovaries.  There is also pain in her right back.  Some nausea but no vomiting, diarrhea, constipation.  No fevers. Pain is a 9/10.  Past Medical History:  Diagnosis Date  . Anemia   . Anxiety   . Arthritis    chronic low back pain  . Cancer ([place])   . Depression   . Fecal incontinence   . Headache(784.0)   . Numbness   . Overactive bladder   . Sleep apnea    CPAP-non compliant  . Tremor     Patient Active Problem List   Diagnosis Date Noted  . Chronic migraine 07/26/2017  . Chronic low back pain without sciatica 07/26/2017  . Gait abnormality 07/26/2017  . Pain of left calf 10/25/2014  . History of treatment by mental health professional 10/02/2014  . Atypical chest pain 08/09/2014  . Routine general medical examination at a health care facility 08/09/2014  . Constipation, postoperative 11/19/2013  .  Pelvic pain in female 11/17/2013  . Back pain 02/03/2012    Past Surgical History:  Procedure Laterality Date  . ABDOMINAL HYSTERECTOMY    . bladder neurostimulator    . COLONOSCOPY  1999  . DILATION AND CURETTAGE OF UTERUS    . FOOT SURGERY  2006   left  . INCISION AND DRAINAGE ABSCESS N/A 11/19/2013   Procedure: POSTERIOR CULPOTOMY (INCISION AND DRAINAGE OF POST OPERATIVE CUFF HEMATOMA);  Surgeon: Jonnie Kind, MD;  Location: [facility];  Service: Gynecology;  Laterality: N/A;  . PUBOVAGINAL SLING  03/24/2012   Procedure: Gaynelle Arabian;  Surgeon: Ailene Rud, MD;  Location: [facility];  Service: Urology;  Laterality: N/A;  lynx with Chemical engineer   . REMOVAL OF URINARY SLING    . SVD     x 3  . TONSILLECTOMY  as child  . URETERAL EXPLORATION  5./30/13  . VAGINAL HYSTERECTOMY N/A 1/2/015   [employer], Dr Linus Mako and Dr Leory Plowman, with release of Pubovag sling    OB History    Gravida Para Term Preterm AB Living   4 3 3  0 1 3   SAB TAB Ectopic Multiple Live Births   1 0 0 0         Home Medications    Prior to Admission medications   Medication Sig  Start Date End Date Taking? Authorizing Provider  amphetamine-dextroamphetamine (ADDERALL) 7.5 MG tablet Take 5 mg by mouth daily.     [provider]  buPROPion (WELLBUTRIN XL) 300 MG 24 hr tablet TK 1 T PO D 11/26/16   [provider]  cyclobenzaprine (FLEXERIL) 10 MG tablet Take 10 mg by mouth 3 (three) times daily as needed for muscle spasms.    [provider]  estradiol (VIVELLE-DOT) 0.025 MG/24HR Place 1 patch onto the skin 2 (two) times a week.    [provider]  fluticasone (FLONASE) 50 MCG/ACT nasal spray Place into both nostrils daily.    [provider]  gabapentin (NEURONTIN) 300 MG capsule TAKE 1 CAPSULE BY MOUTH THREE TIMES DAILY 12/20/15   Hoyt Koch, MD  ibuprofen (ADVIL,MOTRIN) 200 MG tablet Take 600 mg by mouth every 6 (six) hours as needed  for moderate pain.    [provider]  LINZESS 145 MCG CAPS capsule Take 145 mcg by mouth daily.  08/05/14   [provider]  methocarbamol (ROBAXIN) 750 MG tablet Take 750 mg by mouth 3 (three) times daily.     [provider]  nortriptyline (PAMELOR) 10 MG capsule Take 2 capsules (20 mg total) by mouth at bedtime. 07/26/17   Marcial Pacas, MD  oxybutynin (DITROPAN) 5 MG tablet Take 5 mg by mouth 3 (three) times daily.    [provider]  prazosin (MINIPRESS) 2 MG capsule Take 2 mg by mouth at bedtime.    [provider]  ranitidine (ZANTAC) 150 MG capsule Take 1 capsule (150 mg total) by mouth 2 (two) times daily. 04/03/16   Saguier, Percell Miller, PA-C  rizatriptan (MAXALT-MLT) 5 MG disintegrating tablet Take 1 tablet (5 mg total) by mouth as needed. May repeat in 2 hours if needed 07/26/17   Marcial Pacas, MD  Vitamin D, Ergocalciferol, (DRISDOL) 50000 units CAPS capsule Take 1 capsule (50,000 Units total) by mouth every 7 (seven) days. 02/11/17   Copland, Gay Filler, MD    Family History Family History  Problem Relation Age of Onset  . Cancer Mother        Breast cancer  . Thyroid disease Mother   . Hypotension Mother   . Other Father        unsure of history  . Anesthesia problems Neg Hx     Social History Social History  Substance Use Topics  . Smoking status: Never Smoker  . Smokeless tobacco: Never Used  . Alcohol use No     Allergies   Codeine and Penicillins   Review of Systems Review of Systems  Constitutional: Negative for fever.  Gastrointestinal: Positive for abdominal pain and nausea. Negative for constipation and vomiting.  Genitourinary: Negative for dysuria, hematuria, vaginal bleeding and vaginal discharge.  Musculoskeletal: Positive for back pain.  All other systems reviewed and are negative.    Physical Exam Updated Vital Signs BP (!) 131/91 (BP Location: Right Arm)   Pulse 84   Temp 97.9 F (36.6 C) (Oral)   Resp 18    Ht 5\' 4"  (1.626 m)   Wt 70.3 kg (155 lb)   LMP 04/24/2012   SpO2 100%   BMI 26.61 kg/m   Physical Exam  Constitutional: She is oriented to person, place, and time. She appears well-developed and well-nourished. No distress.  HENT:  Head: Normocephalic and atraumatic.  Right Ear: External ear normal.  Left Ear: External ear normal.  Nose: Nose normal.  Eyes: Right eye exhibits  no discharge. Left eye exhibits no discharge.  Cardiovascular: Normal rate, regular rhythm and normal heart sounds.   Pulmonary/Chest: Effort normal and breath sounds normal.  Abdominal: Soft. There is tenderness in the right upper quadrant, right lower quadrant, suprapubic area and left lower quadrant.  Musculoskeletal:       Thoracic back: She exhibits tenderness.       Lumbar back: She exhibits tenderness.       Back:  Mild diffuse tenderness to right mid to lower back  Neurological: She is alert and oriented to person, place, and time.  Skin: Skin is warm and dry. She is not diaphoretic.  Nursing note and vitals reviewed.    ED Treatments / Results  Labs (all labs ordered are listed, but only abnormal results are displayed) Labs Reviewed  COMPREHENSIVE METABOLIC PANEL - Abnormal; Notable for the following:       Result Value   Creatinine, Ser 1.28 (*)    Total Bilirubin 0.2 (*)    GFR calc non Af Amer 50 (*)    GFR calc Af Amer 58 (*)    All other components within normal limits  LIPASE, BLOOD  CBC WITH DIFFERENTIAL/PLATELET  URINALYSIS, ROUTINE W REFLEX MICROSCOPIC    EKG  EKG Interpretation None       Radiology Ct Abdomen Pelvis W Contrast  Result Date: 08/25/2017 CLINICAL DATA:  Three-week history of right lower quadrant abdominal pain, worsened over the past week. Three episodes of nausea and vomiting today. EXAM: CT ABDOMEN AND PELVIS WITH CONTRAST TECHNIQUE: Multidetector CT imaging of the abdomen and pelvis was performed using the standard protocol following bolus  administration of intravenous contrast. CONTRAST:  166mL ISOVUE-300 IOPAMIDOL INJECTION 61% IV. Oral contrast was also administered. COMPARISON:  11/17/2013. FINDINGS: Lower chest: Heart size normal.  Visualized lung bases clear. Hepatobiliary: Liver normal in size and appearance. Gallbladder normal in appearance without calcified gallstones. No biliary ductal dilation. Pancreas: Normal in appearance without evidence of mass, ductal dilation, or inflammation. Spleen: Normal in size and appearance. Adrenals/Urinary Tract: Normal appearing adrenal glands. Kidneys normal in size and appearance without focal parenchymal abnormality. No evidence of urinary tract calculi or obstruction. Normal-appearing urinary bladder. Stomach/Bowel: Thickening of the wall of the gastric antrum. Remainder of the stomach normal in appearance. Normal-appearing small bowel. Moderate stool burden throughout the colon. Descending and sigmoid colon diverticulosis without evidence of acute diverticulitis. Appendix: Location: Right mid abdomen, retrocecal. Diameter: 11 mm. Appendicolith: Absent. Mucosal hyper-enhancement: Present at the appendiceal tip. Remainder of the appendix unremarkable. Extraluminal gas: Absent. Periappendiceal collection: Absent. Vascular/Lymphatic: No visible aortoiliofemoral atherosclerosis. Widely patent visceral arteries. Normal-appearing portal venous and systemic venous systems. Normal-appearing portal venous and systemic venous systems. Note is made of a circumaortic left renal vein. No pathologic lymphadenopathy. Reproductive: Surgically absent uterus. No adnexal masses or free pelvic fluid. Other: Very small umbilical hernia containing fat. Musculoskeletal: Regional skeleton intact without acute or significant osseous abnormality. IMPRESSION: 1. Likely acute appendicitis only involving the appendiceal tip, with the remainder of the appendix normal in appearance. No evidence of perforation or abscess. 2. Focal  wall thickening involving the gastric antrum, query gastritis. 3. No acute abnormalities elsewhere involving the abdomen or pelvis. Moderate colonic stool burden. 4. Descending and sigmoid colon diverticulosis without evidence of acute diverticulitis. I telephoned these results at the time of interpretation on 08/25/2017 at 8:33 pm to Dr. Sherwood Gambler, who verbally acknowledged these results. Electronically Signed   By: Evangeline Dakin M.D.   On: 08/25/2017  20:36    Procedures Procedures (including critical care time)  Medications Ordered in ED Medications  cefTRIAXone (ROCEPHIN) 2 g in dextrose 5 % 50 mL IVPB (not administered)    And  metroNIDAZOLE (FLAGYL) IVPB 500 mg (not administered)  ondansetron (ZOFRAN) injection 4 mg (not administered)  fentaNYL (SUBLIMAZE) injection 100 mcg (not administered)  fentaNYL (SUBLIMAZE) injection 100 mcg (100 mcg Intravenous Given 08/25/17 1857)  sodium chloride 0.9 % bolus 1,000 mL (0 mLs Intravenous Stopped 08/25/17 1935)  iopamidol (ISOVUE-300) 61 % injection 100 mL (100 mLs Intravenous Contrast Given 08/25/17 2001)     Initial Impression / Assessment and Plan / ED Course  I have reviewed the triage vital signs and the nursing notes.  Pertinent labs & imaging results that were available during my care of the patient were reviewed by me and considered in my medical decision making (see chart for details).     Patient CT scan is concerning for appendicitis at the tip of her appendix.  Unclear if this would be the cause of her pain for the last 3 weeks but at the same time she is quite tender in her right mid and lower abdomen.  She is afebrile but given the degree of pain and tenderness I think it is reasonable to have a surgical consult.  She discussed with Dr. Windle Guard, general surgery at [facility], who agrees to see patient in the ED for likely appendectomy.  She will be given IV Rocephin and Flagyl.  She has a penicillin allergy when she was a  kid but states it was only a rash.  N.p.o., fluids, pain control, and transferred to ED.  Dr. Ellender Hose, EDP at [facility] made aware of patient's transfer.  Final Clinical Impressions(s) / ED Diagnoses   Final diagnoses:  Acute appendicitis, unspecified acute appendicitis type    New Prescriptions New Prescriptions   No medications on file     Sherwood Gambler, MD 08/25/17 2108

## 2017-08-25 NOTE — ED Provider Notes (Signed)
46 year old female sent here as a transfer from [facility] for acute, tip appendicitis. Lab work reassuring. Exam shows mild RLQ TTP, but also LLQ TTP. Dr. Kae Heller has evaluated, will admit. IV ABX given.   Duffy Bruce, MD 08/25/17 [phone]

## 2017-08-25 NOTE — H&P (Signed)
Surgical H&P  CC: abdominal pain  HPI: 46 year old woman with history of chronic pelvic pain and both urinary and fecal continence issues following complex pelvic reconstruction surgery in addition to the presence of a sacral neurostimulator, she presented to another [facility] this afternoon for evaluation as there were no openings in her primary care physician's office to be evaluated for abdominal pain. She has been having pain for at least 3 weeks and describes initially sensation of severe pressure in the pelvis. She initially saw her surgeon(?) at [facility] who thought she may need her sacral stimulator evaluated and and planned to do a pelvic ultrasound or CT scan later in November. The pain persisted and did not improve. She's been taking ibuprofen essentially around-the-clock. In the last few days the pain seemed to worsen. She is now describing pain which is pretty broadly across the right side, it is stabbing and throbbing in nature, worsened with movement, and involves the right groin & crease, right hemiabdomen and right flank. This is been associated with nausea and occasional nonbilious emesis. She denies any fevers but has had some hot flashes. Her labs at [facility] were essentially normal. She underwent a CT scan which was negative except for some mucosal enhancement at the very tip of the appendix although there is air in the appendix and no other abnormality present.   Allergies  Allergen Reactions  . Codeine Nausea And Vomiting  . Penicillins Other (See Comments)    Unknown- childhood reaction    Past Medical History:  Diagnosis Date  . Anemia   . Anxiety   . Arthritis    chronic low back pain  . Cancer ([place])   . Depression   . Fecal incontinence   . Headache(784.0)   . Numbness   . Overactive bladder   . Sleep apnea    CPAP-non compliant  . Tremor     Past Surgical History:  Procedure Laterality Date  . ABDOMINAL HYSTERECTOMY    . bladder neurostimulator     . COLONOSCOPY  1999  . DILATION AND CURETTAGE OF UTERUS    . FOOT SURGERY  2006   left  . INCISION AND DRAINAGE ABSCESS N/A 11/19/2013   Procedure: POSTERIOR CULPOTOMY (INCISION AND DRAINAGE OF POST OPERATIVE CUFF HEMATOMA);  Surgeon: Jonnie Kind, MD;  Location: [facility];  Service: Gynecology;  Laterality: N/A;  . PUBOVAGINAL SLING  03/24/2012   Procedure: Gaynelle Arabian;  Surgeon: Ailene Rud, MD;  Location: [facility];  Service: Urology;  Laterality: N/A;  lynx with Chemical engineer   . REMOVAL OF URINARY SLING    . SVD     x 3  . TONSILLECTOMY  as child  . URETERAL EXPLORATION  5./30/13  . VAGINAL HYSTERECTOMY N/A 1/2/015   [name], Dr Linus Mako and Dr Leory Plowman, with release of Pubovag sling    Family History  Problem Relation Age of Onset  . Cancer Mother        Breast cancer  . Thyroid disease Mother   . Hypotension Mother   . Other Father        unsure of history  . Anesthesia problems Neg Hx     Social History   Social History  . Marital status: Married    Spouse name: N/A  . Number of children: 3  . Years of education: Masters   Occupational History  . Tutoring part-time    Social History Main Topics  . Smoking status: Never Smoker  .  Smokeless tobacco: Never Used  . Alcohol use No  . Drug use: No  . Sexual activity: Not Asked   Other Topics Concern  . None   Social History Narrative   Lives at home with husband and children.   Right-handed.   2 cups caffeine per day.    No current facility-administered medications on file prior to encounter.    Current Outpatient Prescriptions on File Prior to Encounter  Medication Sig Dispense Refill  . buPROPion (WELLBUTRIN XL) 300 MG 24 hr tablet take 300mg  by mouth daily  2  . cyclobenzaprine (FLEXERIL) 10 MG tablet Take 10 mg by mouth 3 (three) times daily as needed for muscle spasms.    Marland Kitchen estradiol (VIVELLE-DOT) 0.025 MG/24HR Place 1 patch onto the skin 2 (two) times a week.    .  fluticasone (FLONASE) 50 MCG/ACT nasal spray Place 2 sprays into both nostrils daily.     Marland Kitchen gabapentin (NEURONTIN) 300 MG capsule TAKE 1 CAPSULE BY MOUTH THREE TIMES DAILY 90 capsule 0  . ibuprofen (ADVIL,MOTRIN) 800 MG tablet Take 800 mg by mouth every 6 (six) hours as needed for moderate pain.     Marland Kitchen LINZESS 145 MCG CAPS capsule Take 145 mcg by mouth daily.     . nortriptyline (PAMELOR) 10 MG capsule Take 2 capsules (20 mg total) by mouth at bedtime. 60 capsule 11  . oxybutynin (DITROPAN) 5 MG tablet Take 5 mg by mouth 3 (three) times daily.    . prazosin (MINIPRESS) 2 MG capsule Take 2 mg by mouth at bedtime.    . ranitidine (ZANTAC) 150 MG capsule Take 1 capsule (150 mg total) by mouth 2 (two) times daily. 60 capsule 0  . rizatriptan (MAXALT-MLT) 5 MG disintegrating tablet Take 1 tablet (5 mg total) by mouth as needed. May repeat in 2 hours if needed 15 tablet 11  . Vitamin D, Ergocalciferol, (DRISDOL) 50000 units CAPS capsule Take 1 capsule (50,000 Units total) by mouth every 7 (seven) days. 12 capsule 0    Review of Systems: a complete, 10pt review of systems was completed with pertinent positives and negatives as documented in the HPI  Physical Exam: Vitals:   08/25/17 2119 08/25/17 2219  BP: 121/84 119/84  Pulse: 85 80  Resp: 20 18  Temp: 97.7 F (36.5 C) 97.8 F (36.6 C)  SpO2: 100% 99%   Gen: A&Ox3, no distress  Head: normocephalic, atraumatic, EOMI, anicteric.  Neck: supple without mass or thyromegaly Chest: unlabored respirations, symmetrical air entry   Cardiovascular: RRR with palpable distal pulses, no pedal edema Abdomen: soft, nondistended, diffusely tender including the left lower quadrant, left upper quadrant, epigastrium, periumbilical and suprapubic areas as well as the right lower quadrant and along the costal margins anteriorly and posteriorly over the ribs. There is no rebound or guarding. No mass or organomegaly.  Extremities: warm, without edema, no deformities   Neuro: grossly intact Psych: appropriate mood and affect  Skin: warm and dry   CBC Latest Ref Rng & Units 08/25/2017 02/11/2017 04/06/2016  WBC 4.0 - 10.5 K/uL 5.7 5.3 6.1  Hemoglobin 12.0 - 15.0 g/dL 12.4 13.1 12.3  Hematocrit 36.0 - 46.0 % 37.3 40.4 36.6  Platelets 150 - 400 K/uL 247 308.0 252    CMP Latest Ref Rng & Units 08/25/2017 02/11/2017 04/06/2016  Glucose 65 - 99 mg/dL 89 86 90  BUN 6 - 20 mg/dL 13 9 13   Creatinine 0.44 - 1.00 mg/dL 1.28(H) 1.18 1.11(H)  Sodium 135 - 145  mmol/L 137 139 137  Potassium 3.5 - 5.1 mmol/L 3.8 4.2 3.7  Chloride 101 - 111 mmol/L 105 104 106  CO2 22 - 32 mmol/L 25 31 26   Calcium 8.9 - 10.3 mg/dL 9.0 9.5 8.7(L)  Total Protein 6.5 - 8.1 g/dL 7.1 7.1 6.4(L)  Total Bilirubin 0.3 - 1.2 mg/dL 0.2(L) 0.3 0.5  Alkaline Phos 38 - 126 U/L 61 72 61  AST 15 - 41 U/L 17 17 20   ALT 14 - 54 U/L 16 16 12(L)    No results found for: INR, PROTIME  Imaging: Ct Abdomen Pelvis W Contrast  Result Date: 08/25/2017 CLINICAL DATA:  Three-week history of right lower quadrant abdominal pain, worsened over the past week. Three episodes of nausea and vomiting today. EXAM: CT ABDOMEN AND PELVIS WITH CONTRAST TECHNIQUE: Multidetector CT imaging of the abdomen and pelvis was performed using the standard protocol following bolus administration of intravenous contrast. CONTRAST:  155mL ISOVUE-300 IOPAMIDOL INJECTION 61% IV. Oral contrast was also administered. COMPARISON:  11/17/2013. FINDINGS: Lower chest: Heart size normal.  Visualized lung bases clear. Hepatobiliary: Liver normal in size and appearance. Gallbladder normal in appearance without calcified gallstones. No biliary ductal dilation. Pancreas: Normal in appearance without evidence of mass, ductal dilation, or inflammation. Spleen: Normal in size and appearance. Adrenals/Urinary Tract: Normal appearing adrenal glands. Kidneys normal in size and appearance without focal parenchymal abnormality. No evidence of urinary  tract calculi or obstruction. Normal-appearing urinary bladder. Stomach/Bowel: Thickening of the wall of the gastric antrum. Remainder of the stomach normal in appearance. Normal-appearing small bowel. Moderate stool burden throughout the colon. Descending and sigmoid colon diverticulosis without evidence of acute diverticulitis. Appendix: Location: Right mid abdomen, retrocecal. Diameter: 11 mm. Appendicolith: Absent. Mucosal hyper-enhancement: Present at the appendiceal tip. Remainder of the appendix unremarkable. Extraluminal gas: Absent. Periappendiceal collection: Absent. Vascular/Lymphatic: No visible aortoiliofemoral atherosclerosis. Widely patent visceral arteries. Normal-appearing portal venous and systemic venous systems. Normal-appearing portal venous and systemic venous systems. Note is made of a circumaortic left renal vein. No pathologic lymphadenopathy. Reproductive: Surgically absent uterus. No adnexal masses or free pelvic fluid. Other: Very small umbilical hernia containing fat. Musculoskeletal: Regional skeleton intact without acute or significant osseous abnormality. IMPRESSION: 1. Likely acute appendicitis only involving the appendiceal tip, with the remainder of the appendix normal in appearance. No evidence of perforation or abscess. 2. Focal wall thickening involving the gastric antrum, query gastritis. 3. No acute abnormalities elsewhere involving the abdomen or pelvis. Moderate colonic stool burden. 4. Descending and sigmoid colon diverticulosis without evidence of acute diverticulitis. I telephoned these results at the time of interpretation on 08/25/2017 at 8:33 pm to Dr. Sherwood Gambler, who verbally acknowledged these results. Electronically Signed   By: Evangeline Dakin M.D.   On: 08/25/2017 20:36      A/P: 46 year old woman with complex pelvic history, several weeks of pain and concern for possible tip appendicitis on CT scan. Her history and exam is completely discordant with the  diagnosis of acute appendicitis but she is clearly uncomfortable. She does have a sacral nerve stimulator; the management of which I'm not very familiar with perhaps worth considering further evaluation there as well. I discussed with her at her duration quality and location of pain are not consistent with appendicitis but given the finding on CT scan I do think admission is merited for serial abdominal exams/ labs, symptomatic relief, empiric antibiotics. If her pain localizes more that may be more indicative of appendicitis. If she fails to improve she may  warrant diagnostic laparoscopy. There is some thickening of her stomach noted on CT scan, this concerns me in the setting of her heavy NSAID use for the last few weeks. We will hold any NSAIDs and we will have her on IV Protonix while she is here. All of her questions were answered. She understands the plan and is eager for some symptomatic relief.   Romana Juniper, MD [facility] Surgery, [place] Pager [phone]

## 2017-08-25 NOTE — ED Notes (Signed)
Attempted to receive report from [facility] placed on hold

## 2017-08-25 NOTE — ED Triage Notes (Signed)
C/o RLQ pain x 3 weeks-was seen urogyn at [facility] appt for "a scan in Nov or Dec"-pain worse x 1 week-presents to triage in w/c

## 2017-08-25 NOTE — ED Notes (Signed)
Attempted to give report to charge nurse but unable to give report.  [facility] is here to pick up patient.  [facility] ED charge Roberto Scales is made aware.

## 2017-08-25 NOTE — ED Notes (Signed)
Patient is being transferred to [facility] via [facility].  Metronidazole given to [employer].  Fentanyl and Zofran given prior to transfer.  Vomiting copious amount of clear vomitus during transfer from bed to stretcher.

## 2017-08-25 NOTE — ED Notes (Signed)
Advised patient to leave all her jewelries to her husband.  Plastic bag provided to placed all valuables.

## 2017-08-25 NOTE — ED Notes (Signed)
Patient reports pain RLQ radiating to right lateral side

## 2017-08-26 ENCOUNTER — Observation Stay (HOSPITAL_COMMUNITY): Payer: 59 | Admitting: Certified Registered Nurse Anesthetist

## 2017-08-26 ENCOUNTER — Encounter (HOSPITAL_COMMUNITY)
Admission: EM | Disposition: A | Discharge status: Home / Self Care | Payer: Self-pay | Source: Home / Self Care | Attending: Emergency Medicine

## 2017-08-26 DIAGNOSIS — K3580 Unspecified acute appendicitis: Secondary | ICD-10-CM | POA: Diagnosis not present

## 2017-08-26 DIAGNOSIS — K37 Unspecified appendicitis: Secondary | ICD-10-CM | POA: Diagnosis not present

## 2017-08-26 DIAGNOSIS — K5730 Diverticulosis of large intestine without perforation or abscess without bleeding: Secondary | ICD-10-CM | POA: Diagnosis not present

## 2017-08-26 DIAGNOSIS — G43909 Migraine, unspecified, not intractable, without status migrainosus: Secondary | ICD-10-CM | POA: Diagnosis not present

## 2017-08-26 DIAGNOSIS — M545 Low back pain: Secondary | ICD-10-CM | POA: Diagnosis not present

## 2017-08-26 HISTORY — PX: LAPAROSCOPIC APPENDECTOMY: SHX408

## 2017-08-26 LAB — COMPREHENSIVE METABOLIC PANEL
ALBUMIN: 3.9 g/dL (ref 3.5–5.0)
ALT: 16 U/L (ref 14–54)
ANION GAP: 8 (ref 5–15)
AST: 17 U/L (ref 15–41)
Alkaline Phosphatase: 56 U/L (ref 38–126)
BUN: 9 mg/dL (ref 6–20)
CO2: 26 mmol/L (ref 22–32)
Calcium: 8.5 mg/dL — ABNORMAL LOW (ref 8.9–10.3)
Chloride: 106 mmol/L (ref 101–111)
Creatinine, Ser: 0.98 mg/dL (ref 0.44–1.00)
GFR calc non Af Amer: >60 mL/min (ref >60)
GLUCOSE: 92 mg/dL (ref 65–99)
POTASSIUM: 4.1 mmol/L (ref 3.5–5.1)
SODIUM: 140 mmol/L (ref 135–145)
TOTAL PROTEIN: 6.6 g/dL (ref 6.5–8.1)
Total Bilirubin: 0.5 mg/dL (ref 0.3–1.2)

## 2017-08-26 LAB — CBC
HEMATOCRIT: 36.6 % (ref 36.0–46.0)
HEMOGLOBIN: 11.8 g/dL — AB (ref 12.0–15.0)
MCH: 26.3 pg (ref 26.0–34.0)
MCHC: 32.2 g/dL (ref 30.0–36.0)
MCV: 81.7 fL (ref 78.0–100.0)
Platelets: 216 10*3/uL (ref 150–400)
RBC: 4.48 MIL/uL (ref 3.87–5.11)
RDW: 13.4 % (ref 11.5–15.5)
WBC: 6.1 10*3/uL (ref 4.0–10.5)

## 2017-08-26 LAB — PHOSPHORUS: PHOSPHORUS: 4.6 mg/dL (ref 2.5–4.6)

## 2017-08-26 LAB — HIV ANTIBODY (ROUTINE TESTING W REFLEX): HIV Screen 4th Generation wRfx: NONREACTIVE

## 2017-08-26 LAB — MAGNESIUM: Magnesium: 2 mg/dL (ref 1.7–2.4)

## 2017-08-26 SURGERY — APPENDECTOMY, LAPAROSCOPIC
Anesthesia: General | Site: Abdomen

## 2017-08-26 MED ORDER — DEXAMETHASONE SODIUM PHOSPHATE 10 MG/ML IJ SOLN
INTRAMUSCULAR | Status: AC
  Filled 2017-08-26: qty 1

## 2017-08-26 MED ORDER — PHENYLEPHRINE 40 MCG/ML (10ML) SYRINGE FOR IV PUSH (FOR BLOOD PRESSURE SUPPORT)
PREFILLED_SYRINGE | INTRAVENOUS | Status: AC
  Filled 2017-08-26: qty 10

## 2017-08-26 MED ORDER — FAMOTIDINE 20 MG PO TABS
20.0000 mg | ORAL_TABLET | Freq: Two times a day (BID) | ORAL | Status: DC
  Administered 2017-08-26 – 2017-08-27 (×2): 20 mg via ORAL
  Filled 2017-08-26 (×2): qty 1

## 2017-08-26 MED ORDER — ONDANSETRON HCL 4 MG/2ML IJ SOLN
INTRAMUSCULAR | Status: DC | PRN
  Administered 2017-08-26: 4 mg via INTRAVENOUS

## 2017-08-26 MED ORDER — SUGAMMADEX SODIUM 200 MG/2ML IV SOLN
INTRAVENOUS | Status: DC | PRN
  Administered 2017-08-26: 150 mg via INTRAVENOUS

## 2017-08-26 MED ORDER — HYDROMORPHONE HCL 1 MG/ML IJ SOLN
0.2500 mg | INTRAMUSCULAR | Status: DC | PRN
  Administered 2017-08-26 (×3): 0.5 mg via INTRAVENOUS

## 2017-08-26 MED ORDER — SUGAMMADEX SODIUM 200 MG/2ML IV SOLN
INTRAVENOUS | Status: AC
  Filled 2017-08-26: qty 2

## 2017-08-26 MED ORDER — HYDROMORPHONE HCL 1 MG/ML IJ SOLN
INTRAMUSCULAR | Status: AC
  Administered 2017-08-26: 0.5 mg via INTRAVENOUS
  Filled 2017-08-26: qty 2

## 2017-08-26 MED ORDER — METHOCARBAMOL 1000 MG/10ML IJ SOLN
500.0000 mg | Freq: Three times a day (TID) | INTRAVENOUS | Status: DC
  Filled 2017-08-26 (×6): qty 5

## 2017-08-26 MED ORDER — LIDOCAINE 2% (20 MG/ML) 5 ML SYRINGE
INTRAMUSCULAR | Status: DC | PRN
  Administered 2017-08-26: 70 mg via INTRAVENOUS

## 2017-08-26 MED ORDER — LACTATED RINGERS IV SOLN
INTRAVENOUS | Status: DC
  Administered 2017-08-26: 1000 mL via INTRAVENOUS
  Administered 2017-08-26: 13:00:00 via INTRAVENOUS

## 2017-08-26 MED ORDER — NORTRIPTYLINE HCL 10 MG PO CAPS
20.0000 mg | ORAL_CAPSULE | Freq: Every day | ORAL | Status: DC
  Administered 2017-08-26: 20 mg via ORAL
  Filled 2017-08-26: qty 2

## 2017-08-26 MED ORDER — GABAPENTIN 300 MG PO CAPS
300.0000 mg | ORAL_CAPSULE | Freq: Three times a day (TID) | ORAL | Status: DC
  Administered 2017-08-26 – 2017-08-27 (×3): 300 mg via ORAL
  Filled 2017-08-26 (×3): qty 1

## 2017-08-26 MED ORDER — FENTANYL CITRATE (PF) 100 MCG/2ML IJ SOLN
INTRAMUSCULAR | Status: DC | PRN
  Administered 2017-08-26 (×3): 50 ug via INTRAVENOUS

## 2017-08-26 MED ORDER — ARTIFICIAL TEARS OP OINT
TOPICAL_OINTMENT | OPHTHALMIC | Status: AC
  Filled 2017-08-26: qty 3.5

## 2017-08-26 MED ORDER — SUCCINYLCHOLINE CHLORIDE 200 MG/10ML IV SOSY
PREFILLED_SYRINGE | INTRAVENOUS | Status: DC | PRN
  Administered 2017-08-26: 110 mg via INTRAVENOUS

## 2017-08-26 MED ORDER — PHENYLEPHRINE 40 MCG/ML (10ML) SYRINGE FOR IV PUSH (FOR BLOOD PRESSURE SUPPORT)
PREFILLED_SYRINGE | INTRAVENOUS | Status: DC | PRN
  Administered 2017-08-26: 80 ug via INTRAVENOUS

## 2017-08-26 MED ORDER — PROPOFOL 10 MG/ML IV BOLUS
INTRAVENOUS | Status: AC
  Filled 2017-08-26: qty 20

## 2017-08-26 MED ORDER — HYDROMORPHONE HCL 1 MG/ML IJ SOLN
0.5000 mg | INTRAMUSCULAR | Status: DC | PRN
  Administered 2017-08-26: 0.5 mg via INTRAVENOUS
  Administered 2017-08-26: 1 mg via INTRAVENOUS
  Filled 2017-08-26 (×2): qty 1

## 2017-08-26 MED ORDER — MEPERIDINE HCL 50 MG/ML IJ SOLN: 6.2500 mg | INTRAMUSCULAR | Status: DC | PRN

## 2017-08-26 MED ORDER — LINACLOTIDE 145 MCG PO CAPS
145.0000 ug | ORAL_CAPSULE | Freq: Every day | ORAL | Status: DC
  Administered 2017-08-27: 145 ug via ORAL
  Filled 2017-08-26: qty 1

## 2017-08-26 MED ORDER — ONDANSETRON HCL 4 MG/2ML IJ SOLN: 4.0000 mg | Freq: Once | INTRAMUSCULAR | Status: DC | PRN

## 2017-08-26 MED ORDER — BUPIVACAINE HCL (PF) 0.5 % IJ SOLN
INTRAMUSCULAR | Status: DC | PRN
  Administered 2017-08-26: 10 mL

## 2017-08-26 MED ORDER — LIDOCAINE 2% (20 MG/ML) 5 ML SYRINGE
INTRAMUSCULAR | Status: AC
  Filled 2017-08-26: qty 5

## 2017-08-26 MED ORDER — ROCURONIUM BROMIDE 50 MG/5ML IV SOSY
PREFILLED_SYRINGE | INTRAVENOUS | Status: AC
  Filled 2017-08-26: qty 5

## 2017-08-26 MED ORDER — FENTANYL CITRATE (PF) 250 MCG/5ML IJ SOLN
INTRAMUSCULAR | Status: AC
  Filled 2017-08-26: qty 5

## 2017-08-26 MED ORDER — DEXAMETHASONE SODIUM PHOSPHATE 10 MG/ML IJ SOLN
INTRAMUSCULAR | Status: DC | PRN
  Administered 2017-08-26: 10 mg via INTRAVENOUS

## 2017-08-26 MED ORDER — MIDAZOLAM HCL 2 MG/2ML IJ SOLN
INTRAMUSCULAR | Status: AC
  Filled 2017-08-26: qty 2

## 2017-08-26 MED ORDER — PRAZOSIN HCL 1 MG PO CAPS
2.0000 mg | ORAL_CAPSULE | Freq: Every day | ORAL | Status: DC
  Administered 2017-08-26: 2 mg via ORAL
  Filled 2017-08-26: qty 2

## 2017-08-26 MED ORDER — BUPIVACAINE HCL (PF) 0.5 % IJ SOLN
INTRAMUSCULAR | Status: AC
  Filled 2017-08-26: qty 30

## 2017-08-26 MED ORDER — SUCCINYLCHOLINE CHLORIDE 200 MG/10ML IV SOSY
PREFILLED_SYRINGE | INTRAVENOUS | Status: AC
  Filled 2017-08-26: qty 10

## 2017-08-26 MED ORDER — DEXTROSE 5 % IV SOLN
500.0000 mg | Freq: Three times a day (TID) | INTRAVENOUS | Status: DC
  Administered 2017-08-26 – 2017-08-27 (×2): 500 mg via INTRAVENOUS
  Filled 2017-08-26: qty 5
  Filled 2017-08-26: qty 550
  Filled 2017-08-26: qty 5
  Filled 2017-08-26: qty 550
  Filled 2017-08-26 (×2): qty 5

## 2017-08-26 MED ORDER — PROPOFOL 10 MG/ML IV BOLUS
INTRAVENOUS | Status: DC | PRN
  Administered 2017-08-26: 140 mg via INTRAVENOUS

## 2017-08-26 MED ORDER — 0.9 % SODIUM CHLORIDE (POUR BTL) OPTIME
TOPICAL | Status: DC | PRN
  Administered 2017-08-26: 1000 mL

## 2017-08-26 MED ORDER — CYCLOBENZAPRINE HCL 10 MG PO TABS: 10.0000 mg | ORAL_TABLET | Freq: Three times a day (TID) | ORAL | Status: DC | PRN

## 2017-08-26 MED ORDER — AMPHETAMINE-DEXTROAMPHET ER 5 MG PO CP24
5.0000 mg | ORAL_CAPSULE | Freq: Every day | ORAL | Status: DC
  Administered 2017-08-27: 5 mg via ORAL
  Filled 2017-08-26: qty 1

## 2017-08-26 MED ORDER — ONDANSETRON HCL 4 MG/2ML IJ SOLN
INTRAMUSCULAR | Status: AC
  Filled 2017-08-26: qty 2

## 2017-08-26 MED ORDER — OXYBUTYNIN CHLORIDE 5 MG PO TABS
5.0000 mg | ORAL_TABLET | Freq: Three times a day (TID) | ORAL | Status: DC
  Administered 2017-08-27: 5 mg via ORAL
  Filled 2017-08-26 (×2): qty 1

## 2017-08-26 MED ORDER — BUPROPION HCL ER (XL) 300 MG PO TB24
300.0000 mg | ORAL_TABLET | Freq: Every day | ORAL | Status: DC
  Administered 2017-08-27: 300 mg via ORAL
  Filled 2017-08-26: qty 1

## 2017-08-26 MED ORDER — ROCURONIUM BROMIDE 50 MG/5ML IV SOSY
PREFILLED_SYRINGE | INTRAVENOUS | Status: DC | PRN
  Administered 2017-08-26: 35 mg via INTRAVENOUS

## 2017-08-26 MED ORDER — LACTATED RINGERS IR SOLN
Status: DC | PRN
  Administered 2017-08-26: 1000 mL

## 2017-08-26 MED ORDER — HEPARIN SODIUM (PORCINE) 5000 UNIT/ML IJ SOLN: 5000.0000 [IU] | Freq: Three times a day (TID) | INTRAMUSCULAR | Status: DC

## 2017-08-26 SURGICAL SUPPLY — 49 items
APL SKNCLS STERI-STRIP NONHPOA (GAUZE/BANDAGES/DRESSINGS) ×1
APPLIER CLIP 5 13 M/L LIGAMAX5 (MISCELLANEOUS) ×2
APPLIER CLIP ROT 10 11.4 M/L (STAPLE)
APR CLP MED LRG 11.4X10 (STAPLE)
APR CLP MED LRG 5 ANG JAW (MISCELLANEOUS) ×1
BAG SPEC RTRVL 10 TROC 200 (ENDOMECHANICALS) ×1
BAG SPEC RTRVL LRG 6X4 10 (ENDOMECHANICALS)
BENZOIN TINCTURE PRP APPL 2/3 (GAUZE/BANDAGES/DRESSINGS) ×2 IMPLANT
CABLE HIGH FREQUENCY MONO STRZ (ELECTRODE) ×2 IMPLANT
CHLORAPREP W/TINT 26ML (MISCELLANEOUS) ×2 IMPLANT
CLIP APPLIE 5 13 M/L LIGAMAX5 (MISCELLANEOUS) IMPLANT
CLIP APPLIE ROT 10 11.4 M/L (STAPLE) IMPLANT
CUTTER FLEX LINEAR 45M (STAPLE) ×1 IMPLANT
DECANTER SPIKE VIAL GLASS SM (MISCELLANEOUS) ×2 IMPLANT
DRAIN CHANNEL 19F RND (DRAIN) IMPLANT
DRAPE LAPAROSCOPIC ABDOMINAL (DRAPES) ×2 IMPLANT
DRSG TEGADERM 2-3/8X2-3/4 SM (GAUZE/BANDAGES/DRESSINGS) ×4 IMPLANT
ELECT REM PT RETURN 15FT ADLT (MISCELLANEOUS) ×2 IMPLANT
ENDOLOOP SUT PDS II  0 18 (SUTURE)
ENDOLOOP SUT PDS II 0 18 (SUTURE) IMPLANT
EVACUATOR SILICONE 100CC (DRAIN) IMPLANT
GAUZE SPONGE 2X2 8PLY STRL LF (GAUZE/BANDAGES/DRESSINGS) ×1 IMPLANT
GLOVE ECLIPSE 8.0 STRL XLNG CF (GLOVE) ×2 IMPLANT
GLOVE INDICATOR 8.0 STRL GRN (GLOVE) ×2 IMPLANT
GOWN STRL REUS W/TWL XL LVL3 (GOWN DISPOSABLE) ×4 IMPLANT
KIT BASIN OR (CUSTOM PROCEDURE TRAY) ×2 IMPLANT
POUCH RETRIEVAL ECOSAC 10 (ENDOMECHANICALS) IMPLANT
POUCH RETRIEVAL ECOSAC 10MM (ENDOMECHANICALS) ×1
POUCH SPECIMEN RETRIEVAL 10MM (ENDOMECHANICALS) ×1 IMPLANT
RELOAD 45 VASCULAR/THIN (ENDOMECHANICALS) IMPLANT
RELOAD STAPLE 45 2.5 WHT GRN (ENDOMECHANICALS) IMPLANT
RELOAD STAPLE 45 3.5 BLU ETS (ENDOMECHANICALS) IMPLANT
RELOAD STAPLE TA45 3.5 REG BLU (ENDOMECHANICALS) ×2 IMPLANT
SCISSORS LAP 5X35 DISP (ENDOMECHANICALS) IMPLANT
SET IRRIG TUBING LAPAROSCOPIC (IRRIGATION / IRRIGATOR) ×2 IMPLANT
SHEARS HARMONIC ACE PLUS 36CM (ENDOMECHANICALS) ×2 IMPLANT
SLEEVE XCEL OPT CAN 5 100 (ENDOMECHANICALS) ×2 IMPLANT
SPONGE GAUZE 2X2 STER 10/PKG (GAUZE/BANDAGES/DRESSINGS) ×1
STRIP CLOSURE SKIN 1/2X4 (GAUZE/BANDAGES/DRESSINGS) ×2 IMPLANT
SUT ETHILON 3 0 PS 1 (SUTURE) IMPLANT
SUT MNCRL AB 4-0 PS2 18 (SUTURE) ×2 IMPLANT
TOWEL OR 17X26 10 PK STRL BLUE (TOWEL DISPOSABLE) ×2 IMPLANT
TOWEL OR NON WOVEN STRL DISP B (DISPOSABLE) ×2 IMPLANT
TRAY FOLEY W/METER SILVER 14FR (SET/KITS/TRAYS/PACK) ×1 IMPLANT
TRAY FOLEY W/METER SILVER 16FR (SET/KITS/TRAYS/PACK) IMPLANT
TRAY LAPAROSCOPIC (CUSTOM PROCEDURE TRAY) ×2 IMPLANT
TROCAR BLADELESS OPT 5 100 (ENDOMECHANICALS) ×2 IMPLANT
TROCAR XCEL BLUNT TIP 100MML (ENDOMECHANICALS) ×2 IMPLANT
TUBING INSUF HEATED (TUBING) ×2 IMPLANT

## 2017-08-26 NOTE — Anesthesia Postprocedure Evaluation (Signed)
Anesthesia Post Note  Patient: Megan Gonzalez  Procedure(s) Performed: APPENDECTOMY LAPAROSCOPIC (N/A Abdomen)     Patient location during evaluation: PACU Anesthesia Type: General Level of consciousness: awake and alert Pain management: pain level controlled Vital Signs Assessment: post-procedure vital signs reviewed and stable Respiratory status: spontaneous breathing, nonlabored ventilation, respiratory function stable and patient connected to nasal cannula oxygen Cardiovascular status: blood pressure returned to baseline and stable Postop Assessment: no apparent nausea or vomiting Anesthetic complications: no    Last Vitals:  Vitals:   08/26/17 1345 08/26/17 1400  BP: 122/85 133/81  Pulse: 84 79  Resp: 12 13  Temp: 36.5 C 36.6 C  SpO2: 100% 100%    Last Pain:  Vitals:   08/26/17 1345  TempSrc:   PainSc: [place] DAVID

## 2017-08-26 NOTE — Progress Notes (Signed)
Assessment Active Problems:   Abdominal pain-based on my history, exam, and CT I feel she has acute retrocecal appendicitis-she is not feeling better this morning.   Plan:  I recommended laparoscopic possible open appendectomy. I have discussed the procedure and risks of appendectomy. The risks include but are not limited to bleeding, infection, wound problems, anesthesia, injury to intra-abdominal organs, possibility of postoperative ileus, failure to relieve the pain. She seems to understand and agrees with the plan.   LOS: 0 days        Chief Complaint/Subjective: Still having the same pain in right flank and lateral abdomen with same intensity.  Objective: Vital signs in last 24 hours: Temp:  [97.7 F (36.5 C)-97.9 F (36.6 C)] 97.7 F (36.5 C) (11/01 0547) Pulse Rate:  [66-85] 66 (11/01 0547) Resp:  [16-20] 18 (11/01 0547) BP: (102-131)/(70-91) 113/76 (11/01 0547) SpO2:  [97 %-100 %] 100 % (11/01 0547) Weight:  [70.3 kg (155 lb)] 70.3 kg (155 lb) (10/31 1806) Last BM Date: 08/25/17  Intake/Output from previous day: 10/31 0701 - 11/01 0700 In: 1716.7 [I.V.:611.7; IV Piggyback:1105] Out: 1700 [Urine:1700] Intake/Output this shift: No intake/output data recorded.  PE: General- In NAD.  Awake and alert. Abdomen-soft, right lateral abdominal and flank tenderness  Lab Results:   Recent Labs  08/25/17 1844 08/26/17 0505  WBC 5.7 6.1  HGB 12.4 11.8*  HCT 37.3 36.6  PLT 247 216   BMET  Recent Labs  08/25/17 1844 08/26/17 0505  NA 137 140  K 3.8 4.1  CL 105 106  CO2 25 26  GLUCOSE 89 92  BUN 13 9  CREATININE 1.28* 0.98  CALCIUM 9.0 8.5*   PT/INR No results for input(s): LABPROT, INR in the last 72 hours. Comprehensive Metabolic Panel:    Component Value Date/Time   NA 140 08/26/2017 0505   NA 137 08/25/2017 1844   K 4.1 08/26/2017 0505   K 3.8 08/25/2017 1844   CL 106 08/26/2017 0505   CL 105 08/25/2017 1844   CO2 26 08/26/2017 0505   CO2 25  08/25/2017 1844   BUN 9 08/26/2017 0505   BUN 13 08/25/2017 1844   CREATININE 0.98 08/26/2017 0505   CREATININE 1.28 (H) 08/25/2017 1844   GLUCOSE 92 08/26/2017 0505   GLUCOSE 89 08/25/2017 1844   CALCIUM 8.5 (L) 08/26/2017 0505   CALCIUM 9.0 08/25/2017 1844   AST 17 08/26/2017 0505   AST 17 08/25/2017 1844   ALT 16 08/26/2017 0505   ALT 16 08/25/2017 1844   ALKPHOS 56 08/26/2017 0505   ALKPHOS 61 08/25/2017 1844   BILITOT 0.5 08/26/2017 0505   BILITOT 0.2 (L) 08/25/2017 1844   PROT 6.6 08/26/2017 0505   PROT 7.1 08/25/2017 1844   ALBUMIN 3.9 08/26/2017 0505   ALBUMIN 4.5 08/25/2017 1844     Studies/Results: Ct Abdomen Pelvis W Contrast  Result Date: 08/25/2017 CLINICAL DATA:  Three-week history of right lower quadrant abdominal pain, worsened over the past week. Three episodes of nausea and vomiting today. EXAM: CT ABDOMEN AND PELVIS WITH CONTRAST TECHNIQUE: Multidetector CT imaging of the abdomen and pelvis was performed using the standard protocol following bolus administration of intravenous contrast. CONTRAST:  179mL ISOVUE-300 IOPAMIDOL INJECTION 61% IV. Oral contrast was also administered. COMPARISON:  11/17/2013. FINDINGS: Lower chest: Heart size normal.  Visualized lung bases clear. Hepatobiliary: Liver normal in size and appearance. Gallbladder normal in appearance without calcified gallstones. No biliary ductal dilation. Pancreas: Normal in appearance without evidence of mass, ductal  dilation, or inflammation. Spleen: Normal in size and appearance. Adrenals/Urinary Tract: Normal appearing adrenal glands. Kidneys normal in size and appearance without focal parenchymal abnormality. No evidence of urinary tract calculi or obstruction. Normal-appearing urinary bladder. Stomach/Bowel: Thickening of the wall of the gastric antrum. Remainder of the stomach normal in appearance. Normal-appearing small bowel. Moderate stool burden throughout the colon. Descending and sigmoid colon  diverticulosis without evidence of acute diverticulitis. Appendix: Location: Right mid abdomen, retrocecal. Diameter: 11 mm. Appendicolith: Absent. Mucosal hyper-enhancement: Present at the appendiceal tip. Remainder of the appendix unremarkable. Extraluminal gas: Absent. Periappendiceal collection: Absent. Vascular/Lymphatic: No visible aortoiliofemoral atherosclerosis. Widely patent visceral arteries. Normal-appearing portal venous and systemic venous systems. Normal-appearing portal venous and systemic venous systems. Note is made of a circumaortic left renal vein. No pathologic lymphadenopathy. Reproductive: Surgically absent uterus. No adnexal masses or free pelvic fluid. Other: Very small umbilical hernia containing fat. Musculoskeletal: Regional skeleton intact without acute or significant osseous abnormality. IMPRESSION: 1. Likely acute appendicitis only involving the appendiceal tip, with the remainder of the appendix normal in appearance. No evidence of perforation or abscess. 2. Focal wall thickening involving the gastric antrum, query gastritis. 3. No acute abnormalities elsewhere involving the abdomen or pelvis. Moderate colonic stool burden. 4. Descending and sigmoid colon diverticulosis without evidence of acute diverticulitis. I telephoned these results at the time of interpretation on 08/25/2017 at 8:33 pm to Dr. Sherwood Gambler, who verbally acknowledged these results. Electronically Signed   By: Evangeline Dakin M.D.   On: 08/25/2017 20:36    Anti-infectives: Anti-infectives    Start     Dose/Rate Route Frequency Ordered Stop   08/26/17 2200  cefTRIAXone (ROCEPHIN) 2 g in dextrose 5 % 50 mL IVPB     2 g 100 mL/hr over 30 Minutes Intravenous Every 24 hours 08/25/17 2304     08/26/17 0600  metroNIDAZOLE (FLAGYL) IVPB 500 mg     500 mg 100 mL/hr over 60 Minutes Intravenous Every 8 hours 08/25/17 2304     08/25/17 2115  cefTRIAXone (ROCEPHIN) 2 g in dextrose 5 % 50 mL IVPB     2 g 100  mL/hr over 30 Minutes Intravenous  Once 08/25/17 2101 08/25/17 2139   08/25/17 2115  metroNIDAZOLE (FLAGYL) IVPB 500 mg     500 mg 100 mL/hr over 60 Minutes Intravenous  Once 08/25/17 2101 08/26/17 0051       Icis Budreau J 08/26/2017

## 2017-08-26 NOTE — Anesthesia Procedure Notes (Signed)
Procedure Name: Intubation Date/Time: 08/26/2017 11:18 AM Performed by: Montel Clock Pre-anesthesia Checklist: Patient identified, Emergency Drugs available, Suction available, Patient being monitored and Timeout performed Patient Re-evaluated:Patient Re-evaluated prior to induction Oxygen Delivery Method: Circle system utilized Preoxygenation: Pre-oxygenation with 100% oxygen Induction Type: IV induction, Cricoid Pressure applied and Rapid sequence Laryngoscope Size: Mac and 3 Grade View: Grade II Tube type: Oral Tube size: 7.0 mm Number of attempts: 2 Airway Equipment and Method: Stylet Placement Confirmation: ETT inserted through vocal cords under direct vision,  positive ETCO2 and breath sounds checked- equal and bilateral Secured at: 21 cm Tube secured with: Tape Dental Injury: Injury to lip  Comments: First attempt MAC 3, grade 2b, unable to pass ETT. Dr. Conrad [place] attempt MAC 3, 7.0 ETT passed. Small laceration upper left lip noted with ointment applied.

## 2017-08-26 NOTE — Op Note (Signed)
Appendectomy, Lap, Procedure Note  Pre-operative Diagnosis: Acute retrocecal appendicitis  Post-operative Diagnosis: Same  Procedure:  Laparoscopic appendectomy  Surgeon:  Jackolyn Confer, M.D.  Anesthesia:  General   Indications:  This is a 46 year old female with right flank and right-sided pain and presented to the [facility]. CT scan was concerning for retrocecal appendicitis with inflammation and abnormality at the tip. She was admitted by Dr. Windle Guard late last night. She is now brought to the operating room for appendectomy. Her pain is unchanged this morning.   Procedure Details   She was brought to the operating room, placed in the supine position and general anesthesia was induced, along with placement of orogastric tube, SCDs, and a Foley catheter. A timeout was performed. The abdomen was prepped and draped in a sterile fashion. A small infraumbilical incision was made through the skin, subcutaneous tissue, fascia, and peritoneum entering the peritoneal cavity under direct vision.  A pursestring suture was passed around the fascia with a 0 Vicryl.  The Hasson was introduced into the peritoneal cavity and the tails of the suture were used to hold the Hasson in place.   The pneumoperitoneum was then established to steady pressure of 15 mmHg.   The laparoscope was introduced and there was no evidence of bleeding or underlying organ injury. Additional 5 mm cannulas then placed in the lower midline of the abdomen and the right upper quadrant region under direct visualization. A careful evaluation of the entire abdomen was carried out. The patient was placed in left lateral decubitus position. I retrocecal appendix was present. There were acute inflammatory changes around the tip of the appendix with an inflammatory rind present.   The appendix was carefully mobilized. The mesoappendix was was divided with the harmonic scalpel.   The appendix was amputated off the cecum, with a  small cuff of cecum, using an endo-GIA stapler.  The appendix was placed in a retrieval bag and removed through the subumbilical port incision.  There was bleeding from the staple line controlled with hemoclips. There was no leak from the staple line.   Copious irrigation was  performed and irrigant fluid suctioned from the abdomen as much as possible.  The umbilical trocar was removed and the  port site fascia was closed via the purse string suture under laparoscopic vision. There was no residual palpable fascial defect.  The remaining trocars were removed and all  trocar site skin wounds were closed with 4-0 Monocryl. Steri-Strips and sterile dressings were applied. She tolerated the procedure well, without apparent complications, was taken to recovery in satisfactory condition.   Instrument, sponge, and needle counts were correct at the conclusion of the case.   Findings: The appendix was found to be inflamed. There were not signs of necrosis.  There was not perforation. There was not abscess formation.  Estimated Blood Loss:  150 ml         Drains: none         Specimens: appendix         Complications:  None; patient tolerated the procedure well.         Disposition: PACU - hemodynamically stable.         Condition: stable

## 2017-08-26 NOTE — Transfer of Care (Signed)
Immediate Anesthesia Transfer of Care Note  Patient: Megan Gonzalez  Procedure(s) Performed: APPENDECTOMY LAPAROSCOPIC (N/A Abdomen)  Patient Location: PACU  Anesthesia Type:General  Level of Consciousness: sedated and responds to stimulation  Airway & Oxygen Therapy: Patient Spontanous Breathing and Patient connected to face mask oxygen  Post-op Assessment: Report given to RN and Post -op Vital signs reviewed and stable  Post vital signs: Reviewed and stable  Last Vitals:  Vitals:   08/26/17 0029 08/26/17 0547  BP: 118/77 113/76  Pulse: 81 66  Resp: 18 18  Temp: 36.6 C 36.5 C  SpO2: 97% 100%    Last Pain:  Vitals:   08/26/17 1017  TempSrc:   PainSc: 2       Patients Stated Pain Goal: 3 (06/00/45 9977)  Complications: No apparent anesthesia complications

## 2017-08-26 NOTE — Discharge Instructions (Addendum)
[facility] ______[facility] LAPAROSCOPIC APPENDECTOMY SURGERY: POST OP INSTRUCTIONS Always review your discharge instruction sheet given to you by the facility where your surgery was performed. IF YOU HAVE DISABILITY OR FAMILY LEAVE FORMS, YOU MUST BRING THEM TO THE OFFICE FOR PROCESSING.   DO NOT GIVE THEM TO YOUR DOCTOR.  1. A prescription for pain medication may be given to you upon discharge.  Take your pain medication as prescribed, if needed.  If narcotic pain medicine is not needed, then you may take acetaminophen (Tylenol) or ibuprofen (Advil) as needed. 2. Take your usually prescribed medications unless otherwise directed. 3. If you need a refill on your pain medication, please contact your pharmacy.  They will contact our office to request authorization. Prescriptions will not be filled after 5pm or on week-ends. 4. You should follow a light diet the first few days after arrival home, such as soup and crackers, etc.  Be sure to include lots of fluids daily.  May start lowfat, solid foods 2 days after the surgery. 5. Most patients will experience some swelling and bruising in the area of the incisions.  Ice packs will help.  Swelling and bruising can take several days to resolve.  6. It is common to experience some constipation if taking pain medication after surgery.  Increasing fluid intake and taking a stool softener (such as Colace) will usually help or prevent this problem from occurring.  A mild laxative (Milk of Magnesia or Miralax) should be taken according to package instructions if there are no bowel movements after 48 hours. 7. Unless discharge instructions indicate otherwise, you may remove your bandages on 3 days after surgery.  You may shower when you get home.  You may have steri-strips (small skin tapes) in place directly over the incision.  These strips should be left on the skin until they fall off.  If your surgeon used skin glue on the incision, you may shower in  24 hours.  The glue will flake off over the next 2-3 weeks.  Any sutures or staples will be removed at the office during your follow-up visit. 8. ACTIVITIES:  You may resume regular (light) daily activities beginning the next day--such as daily self-care, walking, climbing stairs--gradually increasing activities as tolerated.  You may have sexual intercourse when it is comfortable.  Refrain from any heavy lifting or straining for two weeks.  Do not lift anything over 10 pounds during that time.  a. You may drive when you are no longer taking prescription pain medication, you can comfortably wear a seatbelt, and you can safely maneuver your car and apply brakes. b. RETURN TO WORK:  Desk type work in 1 week, full duty work in 2 weeks if you are pain-free.________________________________________________________ 9. You should see your doctor or a PA in the office for a follow-up appointment approximately 2-3 weeks after your surgery.  Make sure that you call for this appointment within a day or two after you arrive home to insure a convenient appointment time. 10. OTHER INSTRUCTIONS: __________________________________________________________________________________________________________________________ __________________________________________________________________________________________________________________________ WHEN TO CALL YOUR DOCTOR: 1. Fever over 101.0 2. Inability to urinate 3. Continued bleeding from incision. 4. Increased pain, redness, or drainage from the incision. 5. Increasing abdominal pain  The clinic staff is available to answer your questions during regular business hours.  Please dont hesitate to call and ask to speak to one of the nurses for clinical concerns.  If you have a medical emergency, go to the nearest emergency room or call 911.  A surgeon from [facility] is always on call at the hospital. [address] [place], [place], [place]  [address] ?  [address], [place], [place]   [address] [phone] ? [phone] ? FAX [phone] Web site: [email or web address]  Soft-Food Meal Plan Follow for 2-3 days after discharge A soft-food meal plan includes foods that are safe and easy to swallow. This meal plan typically is used:  If you are having trouble chewing or swallowing foods.  As a transition meal plan after only having had liquid meals for a long period.  What do I need to know about the soft-food meal plan? A soft-food meal plan includes tender foods that are soft and easy to chew and swallow. In most cases, bite-sized pieces of food are easier to swallow. A bite-sized piece is about  inch or smaller. Foods in this plan do not need to be ground or pureed. Foods that are very hard, crunchy, or sticky should be avoided. Also, breads, cereals, yogurts, and desserts with nuts, seeds, or fruits should be avoided. What foods can I eat? Grains Rice and wild rice. Moist bread, dressing, pasta, and noodles. Well-moistened dry or cooked cereals, such as farina (cooked wheat cereal), oatmeal, or grits. Biscuits, breads, muffins, pancakes, and waffles that have been well moistened. Vegetables Shredded lettuce. Cooked, tender vegetables, including potatoes without skins. Vegetable juices. Broths or creamed soups made with vegetables that are not stringy or chewy. Strained tomatoes (without seeds). Fruits Canned or well-cooked fruits. Soft (ripe), peeled fresh fruits, such as peaches, nectarines, kiwi, cantaloupe, honeydew melon, and watermelon (without seeds). Soft berries with small seeds, such as strawberries. Fruit juices (without pulp). Meats and Other Protein Sources Moist, tender, lean beef. Mutton. Lamb. Veal. Chicken. [place]. Liver. Ham. Fish without bones. Eggs. Dairy Milk, milk drinks, and cream. Plain cream cheese and cottage cheese. Plain yogurt. Sweets/Desserts Flavored gelatin desserts. Custard. Plain ice cream,  frozen yogurt, sherbet, milk shakes, and malts. Plain cakes and cookies. Plain hard candy. Other Butter, margarine (without trans fat), and cooking oils. Mayonnaise. Cream sauces. Mild spices, salt, and sugar. Syrup, molasses, honey, and jelly. The items listed above may not be a complete list of recommended foods or beverages. Contact your dietitian for more options. What foods are not recommended? Grains Dry bread, toast, crackers that have not been moistened. Coarse or dry cereals, such as bran, granola, and shredded wheat. Tough or chewy crusty breads, such as [place] bread or baguettes. Vegetables Corn. Raw vegetables except shredded lettuce. Cooked vegetables that are tough or stringy. Tough, crisp, fried potatoes and potato skins. Fruits Fresh fruits with skins or seeds or both, such as apples, pears, or grapes. Stringy, high-pulp fruits, such as papaya, pineapple, coconut, or mango. Fruit leather, fruit roll-ups, and all dried fruits. Meats and Other Protein Sources Sausages and hot dogs. Meats with gristle. Fish with bones. Nuts, seeds, and chunky peanut or other nut butters. Sweets/Desserts Cakes or cookies that are very dry or chewy. The items listed above may not be a complete list of foods and beverages to avoid. Contact your dietitian for more information. This information is not intended to replace advice given to you by your health care provider. Make sure you discuss any questions you have with your health care provider. Document Released: 01/19/2008 Document Revised: 03/19/2016 Document Reviewed: 09/08/2013 Elsevier Interactive Patient Education  2017 [employer].

## 2017-08-26 NOTE — Anesthesia Preprocedure Evaluation (Signed)
Anesthesia Evaluation  Patient identified by MRN, date of birth, ID band Patient awake    Reviewed: Allergy & Precautions, NPO status , Patient's Chart, lab work & pertinent test results  Airway Mallampati: I  TM Distance: >3 FB Neck ROM: Full    Dental   Pulmonary sleep apnea ,    Pulmonary exam normal        Cardiovascular Normal cardiovascular exam     Neuro/Psych Anxiety Depression    GI/Hepatic   Endo/Other    Renal/GU      Musculoskeletal   Abdominal   Peds  Hematology   Anesthesia Other Findings   Reproductive/Obstetrics                             Anesthesia Physical Anesthesia Plan  ASA: II  Anesthesia Plan: General   Post-op Pain Management:    Induction: Intravenous, Rapid sequence and Cricoid pressure planned  PONV Risk Score and Plan: 3 and Ondansetron, Dexamethasone, Midazolam and Treatment may vary due to age or medical condition  Airway Management Planned: Oral ETT  Additional Equipment:   Intra-op Plan:   Post-operative Plan: Extubation in OR  Informed Consent: I have reviewed the patients History and Physical, chart, labs and discussed the procedure including the risks, benefits and alternatives for the proposed anesthesia with the patient or authorized representative who has indicated his/her understanding and acceptance.     Plan Discussed with: CRNA and Surgeon  Anesthesia Plan Comments:         Anesthesia Quick Evaluation

## 2017-08-27 ENCOUNTER — Encounter (HOSPITAL_COMMUNITY): Payer: Self-pay | Admitting: General Surgery

## 2017-08-27 MED ORDER — OXYCODONE HCL 5 MG PO TABS: 5.0000 mg | ORAL_TABLET | ORAL | 0 refills | Status: DC | PRN

## 2017-08-27 MED ORDER — INFLUENZA VAC SPLIT QUAD 0.5 ML IM SUSY
0.5000 mL | PREFILLED_SYRINGE | Freq: Once | INTRAMUSCULAR | Status: AC
  Administered 2017-08-27: 0.5 mL via INTRAMUSCULAR
  Filled 2017-08-27: qty 0.5

## 2017-08-27 MED ORDER — INFLUENZA VAC SPLIT QUAD 0.5 ML IM SUSY: 0.5000 mL | PREFILLED_SYRINGE | INTRAMUSCULAR | Status: DC

## 2017-08-27 NOTE — Discharge Summary (Signed)
[facility] Discharge Summary   Patient ID: Megan Gonzalez MRN: [id number] DOB/AGE: Mar 04, 1971 46 y.o.  Admit date: 08/25/2017 Discharge date: 08/27/2017  Admitting Diagnosis: Appendicitis   Discharge Diagnosis Patient Active Problem List   Diagnosis Date Noted  . Abdominal pain 08/25/2017  . Chronic migraine 07/26/2017  . Chronic low back pain without sciatica 07/26/2017  . Gait abnormality 07/26/2017  . Pain of left calf 10/25/2014  . History of treatment by mental health professional 10/02/2014  . Atypical chest pain 08/09/2014  . Routine general medical examination at a health care facility 08/09/2014  . Constipation, postoperative 11/19/2013  . Pelvic pain in female 11/17/2013  . Back pain 02/03/2012    Consultants none  Imaging: Ct Abdomen Pelvis W Contrast  Result Date: 08/25/2017 CLINICAL DATA:  Three-week history of right lower quadrant abdominal pain, worsened over the past week. Three episodes of nausea and vomiting today. EXAM: CT ABDOMEN AND PELVIS WITH CONTRAST TECHNIQUE: Multidetector CT imaging of the abdomen and pelvis was performed using the standard protocol following bolus administration of intravenous contrast. CONTRAST:  171mL ISOVUE-300 IOPAMIDOL INJECTION 61% IV. Oral contrast was also administered. COMPARISON:  11/17/2013. FINDINGS: Lower chest: Heart size normal.  Visualized lung bases clear. Hepatobiliary: Liver normal in size and appearance. Gallbladder normal in appearance without calcified gallstones. No biliary ductal dilation. Pancreas: Normal in appearance without evidence of mass, ductal dilation, or inflammation. Spleen: Normal in size and appearance. Adrenals/Urinary Tract: Normal appearing adrenal glands. Kidneys normal in size and appearance without focal parenchymal abnormality. No evidence of urinary tract calculi or obstruction. Normal-appearing urinary bladder. Stomach/Bowel: Thickening of the wall of the gastric  antrum. Remainder of the stomach normal in appearance. Normal-appearing small bowel. Moderate stool burden throughout the colon. Descending and sigmoid colon diverticulosis without evidence of acute diverticulitis. Appendix: Location: Right mid abdomen, retrocecal. Diameter: 11 mm. Appendicolith: Absent. Mucosal hyper-enhancement: Present at the appendiceal tip. Remainder of the appendix unremarkable. Extraluminal gas: Absent. Periappendiceal collection: Absent. Vascular/Lymphatic: No visible aortoiliofemoral atherosclerosis. Widely patent visceral arteries. Normal-appearing portal venous and systemic venous systems. Normal-appearing portal venous and systemic venous systems. Note is made of a circumaortic left renal vein. No pathologic lymphadenopathy. Reproductive: Surgically absent uterus. No adnexal masses or free pelvic fluid. Other: Very small umbilical hernia containing fat. Musculoskeletal: Regional skeleton intact without acute or significant osseous abnormality. IMPRESSION: 1. Likely acute appendicitis only involving the appendiceal tip, with the remainder of the appendix normal in appearance. No evidence of perforation or abscess. 2. Focal wall thickening involving the gastric antrum, query gastritis. 3. No acute abnormalities elsewhere involving the abdomen or pelvis. Moderate colonic stool burden. 4. Descending and sigmoid colon diverticulosis without evidence of acute diverticulitis. I telephoned these results at the time of interpretation on 08/25/2017 at 8:33 pm to Dr. Sherwood Gambler, who verbally acknowledged these results. Electronically Signed   By: Evangeline Dakin M.D.   On: 08/25/2017 20:36    Procedures Dr. Zella Richer (08/26/17) - Laparoscopic Appendectomy  HPI:  46 year old woman with history of chronic pelvic pain and both urinary and fecal continence issues following complex pelvic reconstruction surgery in addition to the presence of a sacral neurostimulator, she presented to  [facility] this afternoon for evaluation as there were no openings in her primary care physician's office to be evaluated for abdominal pain. She has been having pain for at least 3 weeks and describes initially sensation of severe pressure in the pelvis. She initially saw her surgeon(?) at [facility] who thought she may  need her sacral stimulator evaluated and and planned to do a pelvic ultrasound or CT scan later in November. The pain persisted and did not improve. She's been taking ibuprofen essentially around-the-clock. In the last few days the pain seemed to worsen. She is now describing pain which is pretty broadly across the right side, it is stabbing and throbbing in nature, worsened with movement, and involves the right groin & crease, right hemiabdomen and right flank. This is been associated with nausea and occasional nonbilious emesis. She denies any fevers but has had some hot flashes. Her labs at [facility] were essentially normal. She underwent a CT scan which was negative except for some mucosal enhancement at the very tip of the appendix although there is air in the appendix and no other abnormality present.   Hospital Course:  Patient was admitted and underwent procedure listed above.  Tolerated procedure well and was transferred to the floor.  Diet was advanced as tolerated.  On POD#1, the patient was voiding well, tolerating diet, ambulating well, pain well controlled, vital signs stable, incisions c/d/i and felt stable for discharge home.  Patient will follow up in our office in 2 weeks and knows to call with questions or concerns.  She will call to confirm appointment date/time.    Patient was discharged in good condition.  The [place] Substance controlled database was reviewed prior to prescribing narcotic pain medication to this patient.  Physical Exam: General:  Alert, NAD, pleasant, cooperative Cardio: RRR, S1 & S2 normal, no murmur, rubs, gallops Resp: Effort normal,  lungs CTA bilaterally, no wheezes, rales, rhonchi Abd:  Soft, ND, normal bowel sounds, mild generalized TTP without guarding, incisions C/D/I Skin: warm and dry  Allergies as of 08/27/2017      Reactions   Codeine Nausea And Vomiting   Penicillins Other (See Comments)   Unknown- childhood reaction      Medication List    TAKE these medications   ADDERALL XR 5 MG 24 hr capsule Generic drug:  amphetamine-dextroamphetamine Take 5 mg by mouth daily.   buPROPion 300 MG 24 hr tablet Commonly known as:  WELLBUTRIN XL take 300mg  by mouth daily   cyclobenzaprine 10 MG tablet Commonly known as:  FLEXERIL Take 10 mg by mouth 3 (three) times daily as needed for muscle spasms.   estradiol 0.025 MG/24HR Commonly known as:  VIVELLE-DOT Place 1 patch onto the skin 2 (two) times a week.   fluticasone 50 MCG/ACT nasal spray Commonly known as:  FLONASE Place 2 sprays into both nostrils daily.   gabapentin 300 MG capsule Commonly known as:  NEURONTIN TAKE 1 CAPSULE BY MOUTH THREE TIMES DAILY   ibuprofen 800 MG tablet Commonly known as:  ADVIL,MOTRIN Take 800 mg by mouth every 6 (six) hours as needed for moderate pain.   LINZESS 145 MCG Caps capsule Generic drug:  linaclotide Take 145 mcg by mouth daily.   nortriptyline 10 MG capsule Commonly known as:  PAMELOR Take 2 capsules (20 mg total) by mouth at bedtime.   oxybutynin 5 MG tablet Commonly known as:  DITROPAN Take 5 mg by mouth 3 (three) times daily.   oxyCODONE 5 MG immediate release tablet Commonly known as:  Oxy IR/ROXICODONE Take 1 tablet (5 mg total) by mouth every 4 (four) hours as needed for moderate pain.   prazosin 2 MG capsule Commonly known as:  MINIPRESS Take 2 mg by mouth at bedtime.   ranitidine 150 MG capsule Commonly known as:  ZANTAC  Take 1 capsule (150 mg total) by mouth 2 (two) times daily.   rizatriptan 5 MG disintegrating tablet Commonly known as:  MAXALT-MLT Take 1 tablet (5 mg total) by mouth  as needed. May repeat in 2 hours if needed   Vitamin D (Ergocalciferol) 50000 units Caps capsule Commonly known as:  DRISDOL Take 1 capsule (50,000 Units total) by mouth every 7 (seven) days.        Follow-up Information    Sur[facility], [place]. Go on 09/09/2017.   Specialty:  General Surgery Why:  Your appointment is at 3:00 PM. Please arrive 30 min prior to appointment time. Bring photo ID and insurance information with you.  Contact information: [address] [place] [address] [phone]           Signed: [facility] 08/27/2017, 9:15 AM Pager: [phone] Consults: [phone] Mon-Fri 7:00 am-4:30 pm Sat-Sun 7:00 am-11:30 am

## 2017-08-27 NOTE — Progress Notes (Addendum)
Patient was given discharge instructions and prescriptions were given. IV was discontinued. Was discharged home with husband.

## 2017-08-27 NOTE — Progress Notes (Signed)
Pharmacy IV to PO conversion  The patient is receiving diphenhydramine by the intravenous route.  Based on the following criteria approved by the Pharmacy and [place], diphenhydramine is being converted to the equivalent oral dose form.   Not prescribed to treat or prevent a severe allergic reaction   Not prescribed as premedication prior to receiving blood product, biologic medication, antimicrobial, or chemotherapy agent   The patient has tolerated at least one dose of an oral or enteral medication   The patient has no evidence of active gastrointestinal bleeding or impaired GI absorption (gastrectomy, short bowel, patient on TNA or NPO).   The patient is not undergoing procedural sedation  If you have any questions about this conversion, please contact the Pharmacy Department (ext [phone]).  Thank you.  Reuel Boom, PharmD Pager: [phone] 08/27/2017, 12:05 PM

## 2017-09-09 DIAGNOSIS — G8918 Other acute postprocedural pain: Secondary | ICD-10-CM | POA: Diagnosis not present

## 2017-09-10 DIAGNOSIS — Z9049 Acquired absence of other specified parts of digestive tract: Secondary | ICD-10-CM | POA: Diagnosis not present

## 2017-09-14 ENCOUNTER — Other Ambulatory Visit: Payer: Self-pay | Admitting: General Surgery

## 2017-09-14 DIAGNOSIS — Z9049 Acquired absence of other specified parts of digestive tract: Secondary | ICD-10-CM

## 2017-09-22 DIAGNOSIS — G4733 Obstructive sleep apnea (adult) (pediatric): Secondary | ICD-10-CM | POA: Diagnosis not present

## 2017-09-22 DIAGNOSIS — G2581 Restless legs syndrome: Secondary | ICD-10-CM | POA: Diagnosis not present

## 2017-09-22 DIAGNOSIS — G4750 Parasomnia, unspecified: Secondary | ICD-10-CM | POA: Diagnosis not present

## 2017-09-23 ENCOUNTER — Ambulatory Visit
Admission: RE | Admit: 2017-09-23 | Discharge: 2017-09-23 | Disposition: A | Discharge status: Home / Self Care | Payer: 59 | Source: Ambulatory Visit | Attending: General Surgery | Admitting: General Surgery

## 2017-09-23 DIAGNOSIS — Z9049 Acquired absence of other specified parts of digestive tract: Secondary | ICD-10-CM

## 2017-09-23 DIAGNOSIS — R1031 Right lower quadrant pain: Secondary | ICD-10-CM | POA: Diagnosis not present

## 2017-09-23 MED ORDER — IOPAMIDOL (ISOVUE-300) INJECTION 61%
100.0000 mL | Freq: Once | INTRAVENOUS | Status: AC | PRN
  Administered 2017-09-23: 100 mL via INTRAVENOUS

## 2017-09-27 DIAGNOSIS — R102 Pelvic and perineal pain: Secondary | ICD-10-CM | POA: Diagnosis not present

## 2017-10-06 DIAGNOSIS — G4733 Obstructive sleep apnea (adult) (pediatric): Secondary | ICD-10-CM | POA: Diagnosis not present

## 2017-10-07 ENCOUNTER — Encounter: Payer: Self-pay | Admitting: Neurology

## 2017-10-07 ENCOUNTER — Ambulatory Visit (INDEPENDENT_AMBULATORY_CARE_PROVIDER_SITE_OTHER): Payer: 59 | Admitting: Neurology

## 2017-10-07 VITALS — BP 120/74 | HR 78 | Ht 62.0 in | Wt 156.0 lb

## 2017-10-07 DIAGNOSIS — G43709 Chronic migraine without aura, not intractable, without status migrainosus: Secondary | ICD-10-CM | POA: Diagnosis not present

## 2017-10-07 DIAGNOSIS — IMO0002 Reserved for concepts with insufficient information to code with codable children: Secondary | ICD-10-CM

## 2017-10-07 NOTE — Patient Instructions (Signed)
Magnesium oxide 400mg  bid Rifboflavin 100mg  bid,

## 2017-10-07 NOTE — Progress Notes (Signed)
PATIENT: Megan Gonzalez DOB: 29-Jun-1971  Chief Complaint  Patient presents with  . Migraine    Her headaches have improved with nortriptyline 20mg  at bedtime.  However, she still has some drowsiness the following morning.  Maxalt works well to rid her more severe pain.     HISTORICAL  Megan Gonzalez is a 46 years old female, seen in refer by her primary care doctor Copland, Gay Filler for evaluation of tremor, headache, initial evaluation was July 26 2017  Reviewed and summarized the referring note, she has past medical history of pelvic floor damage from childbirth in 2011, require pelvic surgery, eventually interStim placement by [facility] pelvic reconstruction surgeon Dr. Linus Mako in 2015,, I was able to review his note from January 11 2017, Patient has urinary and fecal incontinence, chronic pelvic pain, long history of chronic constipation, followed by GI specialist Dr. Ronalee Red.  She continue has chronic low back pain, radiating pain to right hip, right leg, she require intermittent adjustment of her interStim, following her adjustment, she also has transient body shaking, especially her right side, then gradually subsided,  Since summer of 2018, she reported episode of sudden right leg give out underneath her while ambulating, she has chronic mild unsteady gait due to low back pain, right hip pain, but she denies persistent right lower extremity sensory loss or weakness.  She had a history of migraine headaches getting worse since June 2018, she complains of right lateralized severe pounding headache with associated light noise sensitivity, she has to lie down in dark quiet room for couple hours, it happened about 3 times a week, ibuprofen and sleep helps her headache  MRI of the brain was normal in 2015   CT of abdomen in May 2015 showed small bilateral ovarian cyst, no other significant abnormality of abdomen no pelvic identified  MRI of lumbar in 2012 mild degenerative changes  at L4-L5, L5-S1 without significant canal or foraminal stenosis   UPDATE Oct 07 2017: Her migraine has much improved with nortriptyline 10 mg 2 tablets every night, complains of morning hangover, Maxalt works well for headaches,  She has variable effort on examination, REVIEW OF SYSTEMS: Full 14 system review of systems performed and notable only for as above  ALLERGIES: Allergies  Allergen Reactions  . Codeine Nausea And Vomiting  . Penicillins Other (See Comments)    Unknown- childhood reaction    HOME MEDICATIONS: Current Outpatient Medications  Medication Sig Dispense Refill  . ADDERALL XR 5 MG 24 hr capsule Take 5 mg by mouth daily.  0  . buPROPion (WELLBUTRIN XL) 300 MG 24 hr tablet take 300mg  by mouth daily  2  . cyclobenzaprine (FLEXERIL) 10 MG tablet Take 10 mg by mouth 3 (three) times daily as needed for muscle spasms.    Marland Kitchen estradiol (VIVELLE-DOT) 0.025 MG/24HR Place 1 patch onto the skin 2 (two) times a week.    . fluticasone (FLONASE) 50 MCG/ACT nasal spray Place 2 sprays into both nostrils daily.     Marland Kitchen gabapentin (NEURONTIN) 300 MG capsule TAKE 1 CAPSULE BY MOUTH THREE TIMES DAILY 90 capsule 0  . ibuprofen (ADVIL,MOTRIN) 800 MG tablet Take 800 mg by mouth every 6 (six) hours as needed for moderate pain.     Marland Kitchen LINZESS 145 MCG CAPS capsule Take 145 mcg by mouth daily.     . nortriptyline (PAMELOR) 10 MG capsule Take 2 capsules (20 mg total) by mouth at bedtime. 60 capsule 11  . oxybutynin (DITROPAN) 5 MG tablet  Take 5 mg by mouth 3 (three) times daily.    Marland Kitchen oxyCODONE (OXY IR/ROXICODONE) 5 MG immediate release tablet Take 1 tablet (5 mg total) by mouth every 4 (four) hours as needed for moderate pain. 15 tablet 0  . prazosin (MINIPRESS) 2 MG capsule Take 2 mg by mouth at bedtime.    . ranitidine (ZANTAC) 150 MG capsule Take 1 capsule (150 mg total) by mouth 2 (two) times daily. 60 capsule 0  . rizatriptan (MAXALT-MLT) 5 MG disintegrating tablet Take 1 tablet (5 mg total) by  mouth as needed. May repeat in 2 hours if needed 15 tablet 11  . Vitamin D, Ergocalciferol, (DRISDOL) 50000 units CAPS capsule Take 1 capsule (50,000 Units total) by mouth every 7 (seven) days. 12 capsule 0   No current facility-administered medications for this visit.     PAST MEDICAL HISTORY: Past Medical History:  Diagnosis Date  . Anemia   . Anxiety   . Arthritis    chronic low back pain  . Cancer ([place])   . Depression   . Fecal incontinence   . Headache(784.0)   . Numbness   . Overactive bladder   . Sleep apnea    CPAP-non compliant  . Tremor     PAST SURGICAL HISTORY: Past Surgical History:  Procedure Laterality Date  . ABDOMINAL HYSTERECTOMY    . APPENDECTOMY  08/25/2017  . bladder neurostimulator    . COLONOSCOPY  1999  . DILATION AND CURETTAGE OF UTERUS    . FOOT SURGERY  2006   left  . INCISION AND DRAINAGE ABSCESS N/A 11/19/2013   Procedure: POSTERIOR CULPOTOMY (INCISION AND DRAINAGE OF POST OPERATIVE CUFF HEMATOMA);  Surgeon: Jonnie Kind, MD;  Location: [facility];  Service: Gynecology;  Laterality: N/A;  . LAPAROSCOPIC APPENDECTOMY N/A 08/26/2017   Procedure: APPENDECTOMY LAPAROSCOPIC;  Surgeon: Jackolyn Confer, MD;  Location: [facility];  Service: General;  Laterality: N/A;  . PUBOVAGINAL SLING  03/24/2012   Procedure: Gaynelle Arabian;  Surgeon: Ailene Rud, MD;  Location: [facility];  Service: Urology;  Laterality: N/A;  lynx with Chemical engineer   . REMOVAL OF URINARY SLING    . SVD     x 3  . TONSILLECTOMY  as child  . URETERAL EXPLORATION  5./30/13  . VAGINAL HYSTERECTOMY N/A 1/2/015   [employer] [employer], Dr Linus Mako and Dr Leory Plowman, with release of Pubovag sling    FAMILY HISTORY: Family History  Problem Relation Age of Onset  . Cancer Mother        Breast cancer  . Thyroid disease Mother   . Hypotension Mother   . Other Father        unsure of history  . Anesthesia problems Neg Hx     SOCIAL HISTORY:  Social History   Socioeconomic  History  . Marital status: Married    Spouse name: Not on file  . Number of children: 3  . Years of education: Masters  . Highest education level: Not on file  Social Needs  . Financial resource strain: Not on file  . Food insecurity - worry: Not on file  . Food insecurity - inability: Not on file  . Transportation needs - medical: Not on file  . Transportation needs - non-medical: Not on file  Occupational History  . Occupation: Tutoring part-time  Tobacco Use  . Smoking status: Never Smoker  . Smokeless tobacco: Never Used  Substance and Sexual Activity  . Alcohol use: No  . Drug use: No  .  Sexual activity: Not on file  Other Topics Concern  . Not on file  Social History Narrative   Lives at home with husband and children.   Right-handed.   2 cups caffeine per day.     PHYSICAL EXAM   Vitals:   10/07/17 1136  BP: 120/74  Pulse: 78  Weight: 156 lb (70.8 kg)  Height: 5\' 2"  (1.575 m)    Not recorded      Body mass index is 28.53 kg/m.  PHYSICAL EXAMNIATION:  Gen: NAD, conversant, well nourised, obese, well groomed                     Cardiovascular: Regular rate rhythm, no peripheral edema, warm, nontender. Eyes: Conjunctivae clear without exudates or hemorrhage Neck: Supple, no carotid bruits. Pulmonary: Clear to auscultation bilaterally   NEUROLOGICAL EXAM:  MENTAL STATUS: Speech:    Speech is normal; fluent and spontaneous with normal comprehension.  Cognition:     Orientation to time, place and person     Normal recent and remote memory     Normal Attention span and concentration     Normal Language, naming, repeating,spontaneous speech     Fund of knowledge   CRANIAL NERVES: CN II: Visual fields are full to confrontation. Fundoscopic exam is normal with sharp discs and no vascular changes. Pupils are round equal and briskly reactive to light. CN III, IV, VI: extraocular movement are normal. No ptosis. CN V: Facial sensation is intact to  pinprick in all 3 divisions bilaterally. Corneal responses are intact.  CN VII: Face is symmetric with normal eye closure and smile. CN VIII: Hearing is normal to rubbing fingers CN IX, X: Palate elevates symmetrically. Phonation is normal. CN XI: Head turning and shoulder shrug are intact CN XII: Tongue is midline with normal movements and no atrophy.  MOTOR: There is no pronator drift of out-stretched arms. Muscle bulk and tone are normal. Muscle strength is normal.  REFLEXES: Reflexes are 2+ and symmetric at the biceps, triceps, knees, and ankles. Plantar responses are flexor.  SENSORY: Intact to light touch, pinprick, positional sensation and vibratory sensation are intact in fingers and toes.  COORDINATION: Rapid alternating movements and fine finger movements are intact. There is no dysmetria on finger-to-nose and heel-knee-shin.    GAIT/STANCE: Posture is normal. Gait is steady with normal steps, base, arm swing, and turning. Heel and toe walking are normal. Tandem gait is normal.  Romberg is absent.   DIAGNOSTIC DATA (LABS, IMAGING, TESTING) - I reviewed patient records, labs, notes, testing and imaging myself where available.   ASSESSMENT AND PLAN  Caylea Foronda is a 46 y.o. female   Chronic migraine headaches Chronic low back pain,  Nortriptyline 10mg  titrating to 20mg  qhs as preventive medication.  Maxalt prn.     Marcial Pacas, M.D. Ph.D.  [facility] [address], [place], [place] [address] Ph: [phone] Fax: [phone]  CC: Copland, Gay Filler, MD

## 2017-10-21 ENCOUNTER — Other Ambulatory Visit: Payer: Self-pay | Admitting: Family Medicine

## 2017-10-21 ENCOUNTER — Encounter: Payer: Self-pay | Admitting: Family Medicine

## 2017-10-21 ENCOUNTER — Ambulatory Visit (INDEPENDENT_AMBULATORY_CARE_PROVIDER_SITE_OTHER): Payer: 59 | Admitting: Family Medicine

## 2017-10-21 VITALS — BP 106/72 | HR 98 | Temp 98.2°F | Ht 62.0 in | Wt 156.0 lb

## 2017-10-21 DIAGNOSIS — M461 Sacroiliitis, not elsewhere classified: Secondary | ICD-10-CM | POA: Diagnosis not present

## 2017-10-21 DIAGNOSIS — M545 Low back pain, unspecified: Secondary | ICD-10-CM

## 2017-10-21 DIAGNOSIS — R110 Nausea: Secondary | ICD-10-CM | POA: Diagnosis not present

## 2017-10-21 DIAGNOSIS — R3989 Other symptoms and signs involving the genitourinary system: Secondary | ICD-10-CM

## 2017-10-21 LAB — POC URINALSYSI DIPSTICK (AUTOMATED)
BILIRUBIN UA: NEGATIVE
GLUCOSE UA: NEGATIVE
Ketones, UA: NEGATIVE
LEUKOCYTES UA: NEGATIVE
NITRITE UA: NEGATIVE
Protein, UA: NEGATIVE
RBC UA: NEGATIVE
Spec Grav, UA: 1.025 (ref 1.010–1.025)
UROBILINOGEN UA: 0.2 U/dL
pH, UA: 6 (ref 5.0–8.0)

## 2017-10-21 MED ORDER — ONDANSETRON HCL 4 MG PO TABS: 4.0000 mg | ORAL_TABLET | Freq: Three times a day (TID) | ORAL | 0 refills | Status: DC | PRN

## 2017-10-21 MED ORDER — NAPROXEN 500 MG PO TABS: 500.0000 mg | ORAL_TABLET | Freq: Two times a day (BID) | ORAL | 0 refills | Status: DC

## 2017-10-21 MED ORDER — CYCLOBENZAPRINE HCL 10 MG PO TABS: 10.0000 mg | ORAL_TABLET | Freq: Three times a day (TID) | ORAL | 0 refills | Status: DC | PRN

## 2017-10-21 MED ORDER — METHYLPREDNISOLONE ACETATE 40 MG/ML IJ SUSP
40.0000 mg | Freq: Once | INTRAMUSCULAR | Status: AC
  Administered 2017-10-21: 40 mg via INTRAMUSCULAR

## 2017-10-21 MED ORDER — KETOROLAC TROMETHAMINE 60 MG/2ML IM SOLN
60.0000 mg | Freq: Once | INTRAMUSCULAR | Status: AC
  Administered 2017-10-21: 60 mg via INTRAMUSCULAR

## 2017-10-21 NOTE — Patient Instructions (Addendum)
Ice/cold pack over area for 10-15 min every 2-3 hours while awake.  OK to take Tylenol 1000 mg (2 extra strength tabs) or 975 mg (3 regular strength tabs) every 6 hours as needed.  Take Flexeril (cyclobenzaprine) 1-2 hours before planned bedtime. If it makes you drowsy, do not take during the day. You can try half a tab the following night.  No Naproxen for rest of today.

## 2017-10-21 NOTE — Telephone Encounter (Signed)
Ok to send in #[phone]?  

## 2017-10-21 NOTE — Progress Notes (Signed)
Pre visit review using our clinic review tool, if applicable. No additional management support is needed unless otherwise documented below in the visit note. 

## 2017-10-21 NOTE — Progress Notes (Addendum)
Chief Complaint  Patient presents with  . Back Pain  . Dysuria    Megan Gonzalez is a 46 y.o. female here for R LBP.  This started several days ago.  There is no injury or change in activity.  She feels pressure when she urinates, however it does not feel like a urinary tract infection to her.  Right lower back pain and over her tailbone.  She has had issues with her tailbone in the past.  She denies any numbness, fevers, abdominal pain, tingling, weakness, or incontinence.  ROS:  Constitutional: denies fever GU: As noted in HPI MSK: As noted in HPI  Past Medical History:  Diagnosis Date  . Anemia   . Anxiety   . Arthritis    chronic low back pain  . Cancer ([place])   . Depression   . Fecal incontinence   . Headache(784.0)   . Numbness   . Overactive bladder   . Sleep apnea    CPAP-non compliant  . Tremor       BP 106/72 (BP Location: Left Arm, Patient Position: Sitting, Cuff Size: Normal)   Pulse 98   Temp 98.2 F (36.8 C) (Oral)   Ht 5\' 2"  (1.575 m)   Wt 156 lb (70.8 kg)   LMP 04/24/2012   SpO2 99%   BMI 28.53 kg/m  General: Awake, alert, appears stated age 60: MMM Heart: RRR, no murmurs Lungs: CTAB, normal respiratory effort, no accessory muscle usage Abd: BS+, soft, NT, ND, no masses or organomegaly MSK: No CVA tenderness, neg Lloyd's sign; neg straight leg, TTP over R lumbar paraspanal msc, +TTP over R SI joint Psych: Age appropriate judgment and insight  Procedure note; SI joint injection Informed consent obtained. The PSIS's were palpated and demarcated with an otoscope speculum tip just medial to the side of interest. The area was cleaned with alcohol. It was sprayed with freeze spray. The joint was entered and 40 mg Depo with 2 mL of 1% lidocaine was injected. The area was then bandaged. There were no complications noted.  The patient tolerated the procedure well.   Acute right-sided low back pain without sciatica - Plan: cyclobenzaprine  (FLEXERIL) 10 MG tablet, ketorolac (TORADOL) injection 60 mg, methylPREDNISolone acetate (DEPO-MEDROL) injection 40 mg, DISCONTINUED: naproxen (NAPROSYN) 500 MG tablet  SI (sacroiliac) joint inflammation (H[facility]) - Plan: cyclobenzaprine (FLEXERIL) 10 MG tablet, ketorolac (TORADOL) injection 60 mg, methylPREDNISolone acetate (DEPO-MEDROL) injection 40 mg, Inject trigger point, 1 or 2, DISCONTINUED: naproxen (NAPROSYN) 500 MG tablet  Sensation of pressure in bladder area - Plan: POCT Urinalysis Dipstick (Automated), Urine Culture  Nausea - Plan: ondansetron (ZOFRAN) 4 MG tablet  Orders as above. Ice, heat, other orders as above.  No anti-inflammatories for the rest of the day. UA neg, will culture. F/u prn. The patient voiced understanding and agreement to the plan.  [place], DO 10/22/17 9:43 AM

## 2017-10-22 ENCOUNTER — Encounter: Payer: Self-pay | Admitting: Family Medicine

## 2017-10-22 LAB — URINE CULTURE
MICRO NUMBER: 81452152
RESULT: NO GROWTH
SPECIMEN QUALITY: ADEQUATE

## 2017-10-22 NOTE — Addendum Note (Signed)
Addended by: Ames Coupe on: 10/22/2017 09:43 AM   Modules accepted: Orders

## 2017-10-25 DIAGNOSIS — K219 Gastro-esophageal reflux disease without esophagitis: Secondary | ICD-10-CM | POA: Diagnosis not present

## 2017-10-25 DIAGNOSIS — K5900 Constipation, unspecified: Secondary | ICD-10-CM | POA: Diagnosis not present

## 2017-10-25 DIAGNOSIS — R1031 Right lower quadrant pain: Secondary | ICD-10-CM | POA: Diagnosis not present

## 2017-11-03 ENCOUNTER — Telehealth: Payer: Self-pay | Admitting: *Deleted

## 2017-11-03 NOTE — Telephone Encounter (Signed)
Received request for Medical records from [facility], forwarded to [place] for email/scan/SLS 01/09

## 2017-12-01 DIAGNOSIS — Z0271 Encounter for disability determination: Secondary | ICD-10-CM

## 2017-12-13 DIAGNOSIS — M6289 Other specified disorders of muscle: Secondary | ICD-10-CM | POA: Diagnosis not present

## 2017-12-17 ENCOUNTER — Telehealth: Payer: Self-pay | Admitting: *Deleted

## 2017-12-17 NOTE — Telephone Encounter (Signed)
Received request for Medical records from [facility], forwarded to [place] for email/scan/SLS 02/22

## 2017-12-27 DIAGNOSIS — G4750 Parasomnia, unspecified: Secondary | ICD-10-CM | POA: Diagnosis not present

## 2017-12-27 DIAGNOSIS — G4733 Obstructive sleep apnea (adult) (pediatric): Secondary | ICD-10-CM | POA: Diagnosis not present

## 2017-12-27 DIAGNOSIS — G2581 Restless legs syndrome: Secondary | ICD-10-CM | POA: Diagnosis not present

## 2018-03-28 DIAGNOSIS — G4733 Obstructive sleep apnea (adult) (pediatric): Secondary | ICD-10-CM | POA: Diagnosis not present

## 2018-03-28 DIAGNOSIS — G2581 Restless legs syndrome: Secondary | ICD-10-CM | POA: Diagnosis not present

## 2018-04-07 DIAGNOSIS — R232 Flushing: Secondary | ICD-10-CM | POA: Diagnosis not present

## 2018-04-08 ENCOUNTER — Ambulatory Visit: Payer: 59 | Admitting: Family Medicine

## 2018-04-08 ENCOUNTER — Encounter: Payer: Self-pay | Admitting: Family Medicine

## 2018-04-08 VITALS — BP 108/78 | HR 86 | Temp 98.1°F | Ht 62.0 in | Wt 162.1 lb

## 2018-04-08 DIAGNOSIS — R609 Edema, unspecified: Secondary | ICD-10-CM

## 2018-04-08 DIAGNOSIS — R0789 Other chest pain: Secondary | ICD-10-CM

## 2018-04-08 LAB — TSH: TSH: 1.58 u[IU]/mL (ref 0.35–4.50)

## 2018-04-08 LAB — COMPREHENSIVE METABOLIC PANEL
ALBUMIN: 4.5 g/dL (ref 3.5–5.2)
ALT: 25 U/L (ref 0–35)
AST: 18 U/L (ref 0–37)
Alkaline Phosphatase: 77 U/L (ref 39–117)
BILIRUBIN TOTAL: 0.3 mg/dL (ref 0.2–1.2)
BUN: 12 mg/dL (ref 6–23)
CHLORIDE: 103 meq/L (ref 96–112)
CO2: 29 mEq/L (ref 19–32)
CREATININE: 1.22 mg/dL — AB (ref 0.40–1.20)
Calcium: 9.9 mg/dL (ref 8.4–10.5)
GFR: 60.97 mL/min (ref >60.00)
Glucose, Bld: 85 mg/dL (ref 70–99)
Potassium: 4.2 mEq/L (ref 3.5–5.1)
SODIUM: 141 meq/L (ref 135–145)
TOTAL PROTEIN: 6.8 g/dL (ref 6.0–8.3)

## 2018-04-08 NOTE — Progress Notes (Signed)
Pre visit review using our clinic review tool, if applicable. No additional management support is needed unless otherwise documented below in the visit note. 

## 2018-04-08 NOTE — Progress Notes (Signed)
Chief Complaint  Patient presents with  . Edema  . Nausea    Subjective: Patient is a 47 y.o. female here for edema.  Started around 2-3 months. This will come nad go. Went to GYN yesterday at [facility] at was told to f/u here. Does not consume large amounts of salt. Tries to stay well hydrated. No new pain anywhere or recent inactivity from baseline. No recent travel. Denies hx of heart, liver or kidney failure. She has a hx of L sided CP with neg workup. She has it again right now.  Of note, she is not having any swelling at this time.   ROS: Heart: +L upper chest pain  Lungs: Denies SOB   Family History  Problem Relation Age of Onset  . Cancer Mother        Breast cancer  . Thyroid disease Mother   . Hypotension Mother   . Other Father        unsure of history  . Anesthesia problems Neg Hx    Past Medical History:  Diagnosis Date  . Anemia   . Anxiety   . Arthritis    chronic low back pain  . Cancer ([place])    cervical cancer; no more uterus  . Depression   . Fecal incontinence   . Headache(784.0)   . Numbness   . Overactive bladder   . Sleep apnea    CPAP-non compliant  . Tremor    Allergies  Allergen Reactions  . Codeine Nausea And Vomiting  . Penicillins Other (See Comments)    Unknown- childhood reaction    Current Outpatient Medications:  .  buPROPion (WELLBUTRIN XL) 300 MG 24 hr tablet, take 300mg  by mouth daily, Disp: , Rfl: 2 .  estradiol (VIVELLE-DOT) 0.025 MG/24HR, Place 1 patch onto the skin 2 (two) times a week., Disp: , Rfl:  .  fluticasone (FLONASE) 50 MCG/ACT nasal spray, Place 2 sprays into both nostrils daily. , Disp: , Rfl:  .  gabapentin (NEURONTIN) 300 MG capsule, TAKE 1 CAPSULE BY MOUTH THREE TIMES DAILY, Disp: 90 capsule, Rfl: 0 .  ibuprofen (ADVIL,MOTRIN) 800 MG tablet, Take 800 mg by mouth every 6 (six) hours as needed for moderate pain. , Disp: , Rfl:  .  LINZESS 145 MCG CAPS capsule, Take 145 mcg by mouth daily. , Disp: , Rfl:  .   nortriptyline (PAMELOR) 10 MG capsule, Take 2 capsules (20 mg total) by mouth at bedtime., Disp: 60 capsule, Rfl: 11 .  oxybutynin (DITROPAN) 5 MG tablet, Take 5 mg by mouth 3 (three) times daily., Disp: , Rfl:  .  prazosin (MINIPRESS) 2 MG capsule, Take 2 mg by mouth at bedtime., Disp: , Rfl:  .  rizatriptan (MAXALT-MLT) 5 MG disintegrating tablet, Take 1 tablet (5 mg total) by mouth as needed. May repeat in 2 hours if needed, Disp: 15 tablet, Rfl: 11 .  Vitamin D, Ergocalciferol, (DRISDOL) 50000 units CAPS capsule, Take 1 capsule (50,000 Units total) by mouth every 7 (seven) days., Disp: 12 capsule, Rfl: 0  Objective: BP 108/78 (BP Location: Left Arm, Patient Position: Sitting, Cuff Size: Normal)   Pulse 86   Temp 98.1 F (36.7 C) (Oral)   Ht 5\' 2"  (1.575 m)   Wt 162 lb 2 oz (73.5 kg)   LMP 04/24/2012   SpO2 94%   BMI 29.65 kg/m  General: Awake, appears stated age HEENT: MMM, EOMi Heart: RRR, no murmurs, no bruits, no LE edema Lungs: CTAB, no rales, wheezes or  rhonchi. No accessory muscle use Abd: BS+, soft, mild lower abd ttp, ND, no masses or organomegaly Psych: Age appropriate judgment and insight, normal affect and mood  Assessment and Plan: Edema, unspecified type - Plan: Comprehensive metabolic panel, TSH, ECHOCARDIOGRAM COMPLETE, EKG 12-Lead  Atypical chest pain - Plan: EKG 12-Lead  Orders as above. Ck labs and echo.  Ekg neg. Reassurance for this. If no improvement, may trial diuretic. F/u in 4 weeks.  The patient voiced understanding and agreement to the plan.  [place], DO 04/08/18  8:49 AM

## 2018-04-08 NOTE — Patient Instructions (Signed)
If you do not hear anything about your echo the next week, call our office and ask for an update.  We will be in touch regarding your labs.  Mind the salt intake. Stay as active as possible.  Let us know if you need anything.

## 2018-04-20 ENCOUNTER — Ambulatory Visit (HOSPITAL_BASED_OUTPATIENT_CLINIC_OR_DEPARTMENT_OTHER)
Admission: RE | Admit: 2018-04-20 | Discharge: 2018-04-20 | Disposition: A | Discharge status: Home / Self Care | Payer: 59 | Source: Ambulatory Visit | Attending: Family Medicine | Admitting: Family Medicine

## 2018-04-20 DIAGNOSIS — R609 Edema, unspecified: Secondary | ICD-10-CM | POA: Insufficient documentation

## 2018-04-20 NOTE — Progress Notes (Signed)
Echocardiogram 2D Echocardiogram has been performed.  Megan Gonzalez 04/20/2018, 8:50 AM

## 2018-05-09 ENCOUNTER — Ambulatory Visit: Payer: 59 | Admitting: Family Medicine

## 2018-05-09 ENCOUNTER — Encounter: Payer: Self-pay | Admitting: Family Medicine

## 2018-05-09 VITALS — BP 110/72 | HR 92 | Temp 98.4°F | Ht 62.0 in | Wt 168.0 lb

## 2018-05-09 DIAGNOSIS — M461 Sacroiliitis, not elsewhere classified: Secondary | ICD-10-CM

## 2018-05-09 DIAGNOSIS — R609 Edema, unspecified: Secondary | ICD-10-CM | POA: Insufficient documentation

## 2018-05-09 MED ORDER — GABAPENTIN 300 MG PO CAPS: 300.0000 mg | ORAL_CAPSULE | Freq: Three times a day (TID) | ORAL | 5 refills | Status: DC

## 2018-05-09 MED ORDER — METHYLPREDNISOLONE ACETATE 40 MG/ML IJ SUSP
40.0000 mg | Freq: Once | INTRAMUSCULAR | Status: AC
  Administered 2018-05-09: 40 mg via INTRA_ARTICULAR

## 2018-05-09 NOTE — Progress Notes (Signed)
Pre visit review using our clinic review tool, if applicable. No additional management support is needed unless otherwise documented below in the visit note. 

## 2018-05-09 NOTE — Progress Notes (Signed)
Chief Complaint  Patient presents with  . Follow-up    swelling    Subjective: Patient is a 47 y.o. female here for f/u swelling.  Still having some swelling in b/l UE's. Has gained around 5 lbs. No LE edema noticed. Due to pain in SI joint, she is not very active. Does eat fast food 3-4 times per week. No much in the way of home cooked meals. Denies CP, sob.   ROS: Heart: Denies chest pain  Lungs: Denies SOB   Past Medical History:  Diagnosis Date  . Anemia   . Anxiety   . Arthritis    chronic low back pain  . Cancer ([place])    cervical cancer; no more uterus  . Depression   . Fecal incontinence   . Headache(784.0)   . Numbness   . Overactive bladder   . Sleep apnea    CPAP-non compliant  . Tremor     Objective: BP 110/72 (BP Location: Left Arm, Patient Position: Sitting, Cuff Size: Normal)   Pulse 92   Temp 98.4 F (36.9 C) (Oral)   Ht 5\' 2"  (1.575 m)   Wt 168 lb (76.2 kg)   LMP 04/24/2012   SpO2 97%   BMI 30.73 kg/m  General: Awake, appears stated age HEENT: MMM, EOMi Heart: RRR, no LE edema, +nonpitting edema in hands and distal forearms Lungs: CTAB, no rales, wheezes or rhonchi. No accessory muscle use MSK: No calf ttp or forearm ttp; +TTP over R SI jt Psych: Age appropriate judgment and insight, normal affect and mood  Procedure note; SI joint injection Informed consent obtained. The PSIS's were palpated and demarcated with an otoscope speculum tip just medial to the side of interest. The area was cleaned with alcohol. The joint was entered and 40 mg depomedrol with 2 mL of 1% lidocaine was injected. The area was then bandaged. There were no complications noted.  The patient tolerated the procedure well.   Assessment and Plan: Edema, unspecified type  Sacroiliitis ([place])  Improve diet. I think this is the cause. She has had a nml echo and no evidence of endocrine issues, hepatic or renal failure. If no improvement, will trial loop diuretic.  Stay  active. Inj today. F/u prn.  The patient voiced understanding and agreement to the plan.  [place], DO 05/09/18  2:50 PM

## 2018-05-09 NOTE — Addendum Note (Signed)
Addended by: Sharon Seller B on: 05/09/2018 03:26 PM   Modules accepted: Orders

## 2018-05-09 NOTE — Patient Instructions (Addendum)
Send me a MyChart message in 2-3 weeks if you are not noticing improvement. I want you to cut fast food out of your life and cook low sodium/salt meals. I think this could help immensely. OK to continue drinking lots of water. Stay active.  Let us know if you need anything.

## 2018-06-15 ENCOUNTER — Encounter: Payer: 59 | Admitting: Family Medicine

## 2018-06-17 ENCOUNTER — Encounter: Payer: Self-pay | Admitting: Family Medicine

## 2018-06-29 DIAGNOSIS — G2581 Restless legs syndrome: Secondary | ICD-10-CM | POA: Diagnosis not present

## 2018-06-29 DIAGNOSIS — G4733 Obstructive sleep apnea (adult) (pediatric): Secondary | ICD-10-CM | POA: Diagnosis not present

## 2018-06-29 DIAGNOSIS — G4750 Parasomnia, unspecified: Secondary | ICD-10-CM | POA: Diagnosis not present

## 2018-07-08 DIAGNOSIS — N3941 Urge incontinence: Secondary | ICD-10-CM | POA: Diagnosis not present

## 2018-07-08 DIAGNOSIS — R151 Fecal smearing: Secondary | ICD-10-CM | POA: Diagnosis not present

## 2018-07-08 DIAGNOSIS — N182 Chronic kidney disease, stage 2 (mild): Secondary | ICD-10-CM | POA: Diagnosis not present

## 2018-08-04 ENCOUNTER — Telehealth: Payer: Self-pay | Admitting: *Deleted

## 2018-08-04 NOTE — Telephone Encounter (Signed)
Received request for Medical records from [place], forwarded to [facility] for email/scan/SLS 10/10

## 2018-08-15 DIAGNOSIS — M79642 Pain in left hand: Secondary | ICD-10-CM | POA: Diagnosis not present

## 2018-08-15 DIAGNOSIS — G5603 Carpal tunnel syndrome, bilateral upper limbs: Secondary | ICD-10-CM | POA: Diagnosis not present

## 2018-08-15 DIAGNOSIS — M79641 Pain in right hand: Secondary | ICD-10-CM | POA: Diagnosis not present

## 2018-08-29 DIAGNOSIS — G5603 Carpal tunnel syndrome, bilateral upper limbs: Secondary | ICD-10-CM | POA: Diagnosis not present

## 2018-09-08 ENCOUNTER — Encounter: Payer: 59 | Admitting: Family Medicine

## 2018-09-08 ENCOUNTER — Encounter: Payer: Self-pay | Admitting: Family Medicine

## 2018-09-08 ENCOUNTER — Ambulatory Visit (INDEPENDENT_AMBULATORY_CARE_PROVIDER_SITE_OTHER): Payer: 59 | Admitting: Family Medicine

## 2018-09-08 VITALS — BP 108/72 | HR 89 | Temp 98.1°F | Ht 62.0 in | Wt 168.0 lb

## 2018-09-08 DIAGNOSIS — Z1239 Encounter for other screening for malignant neoplasm of breast: Secondary | ICD-10-CM

## 2018-09-08 DIAGNOSIS — N182 Chronic kidney disease, stage 2 (mild): Secondary | ICD-10-CM | POA: Diagnosis not present

## 2018-09-08 DIAGNOSIS — Z0000 Encounter for general adult medical examination without abnormal findings: Secondary | ICD-10-CM

## 2018-09-08 DIAGNOSIS — Z23 Encounter for immunization: Secondary | ICD-10-CM

## 2018-09-08 LAB — COMPREHENSIVE METABOLIC PANEL
ALK PHOS: 76 U/L (ref 39–117)
ALT: 16 U/L (ref 0–35)
AST: 15 U/L (ref 0–37)
Albumin: 4.6 g/dL (ref 3.5–5.2)
BILIRUBIN TOTAL: 0.4 mg/dL (ref 0.2–1.2)
BUN: 12 mg/dL (ref 6–23)
CALCIUM: 9.7 mg/dL (ref 8.4–10.5)
CHLORIDE: 103 meq/L (ref 96–112)
CO2: 30 meq/L (ref 19–32)
Creatinine, Ser: 1.18 mg/dL (ref 0.40–1.20)
GFR: 63.25 mL/min (ref >60.00)
GLUCOSE: 91 mg/dL (ref 70–99)
Potassium: 4.7 mEq/L (ref 3.5–5.1)
SODIUM: 140 meq/L (ref 135–145)
Total Protein: 6.9 g/dL (ref 6.0–8.3)

## 2018-09-08 LAB — LIPID PANEL
Cholesterol: 207 mg/dL — ABNORMAL HIGH (ref 0–200)
HDL: 46.3 mg/dL (ref >39.00)
LDL Cholesterol: 142 mg/dL — ABNORMAL HIGH (ref 0–99)
NONHDL: 160.67
TRIGLYCERIDES: 91 mg/dL (ref 0.0–149.0)
Total CHOL/HDL Ratio: 4
VLDL: 18.2 mg/dL (ref 0.0–40.0)

## 2018-09-08 NOTE — Progress Notes (Signed)
Chief Complaint  Patient presents with  . Annual Exam     Well Woman Megan Gonzalez is here for a complete physical.   Her last physical was >1 year ago.  Current diet: in general, a "healthy" diet. Current exercise: some walking. Weight is stable and she denies daytime fatigue. Patient's last menstrual period was 04/24/2012. Seatbelt? Yes  Health Maintenance Mammogram- No Tetanus- Yes HIV screening- Yes  Past Medical History:  Diagnosis Date  . Anemia   . Anxiety   . Arthritis    chronic low back pain  . Cancer ([place])    cervical cancer; no more uterus  . Depression   . Fecal incontinence   . Headache(784.0)   . Numbness   . Overactive bladder   . Sleep apnea    CPAP-non compliant  . Tremor      Past Surgical History:  Procedure Laterality Date  . ABDOMINAL HYSTERECTOMY    . APPENDECTOMY  08/25/2017  . bladder neurostimulator    . COLONOSCOPY  1999  . DILATION AND CURETTAGE OF UTERUS    . FOOT SURGERY  2006   left  . INCISION AND DRAINAGE ABSCESS N/A 11/19/2013   Procedure: POSTERIOR CULPOTOMY (INCISION AND DRAINAGE OF POST OPERATIVE CUFF HEMATOMA);  Surgeon: Jonnie Kind, MD;  Location: [facility];  Service: Gynecology;  Laterality: N/A;  . LAPAROSCOPIC APPENDECTOMY N/A 08/26/2017   Procedure: APPENDECTOMY LAPAROSCOPIC;  Surgeon: Jackolyn Confer, MD;  Location: [facility];  Service: General;  Laterality: N/A;  . PUBOVAGINAL SLING  03/24/2012   Procedure: Gaynelle Arabian;  Surgeon: Ailene Rud, MD;  Location: [facility];  Service: Urology;  Laterality: N/A;  lynx with Chemical engineer   . REMOVAL OF URINARY SLING    . SVD     x 3  . TONSILLECTOMY  as child  . URETERAL EXPLORATION  5./30/13  . VAGINAL HYSTERECTOMY N/A 1/2/015   [name], Dr Linus Mako and Dr Leory Plowman, with release of Pubovag sling    Medications  Current Outpatient Medications on File Prior to Visit  Medication Sig Dispense Refill  . buPROPion (WELLBUTRIN XL) 300 MG 24 hr  tablet take 300mg  by mouth daily  2  . estradiol (VIVELLE-DOT) 0.025 MG/24HR Place 1 patch onto the skin 2 (two) times a week.    . fluticasone (FLONASE) 50 MCG/ACT nasal spray Place 2 sprays into both nostrils daily.     Marland Kitchen gabapentin (NEURONTIN) 300 MG capsule Take 1 capsule (300 mg total) by mouth 3 (three) times daily. 90 capsule 5  . ibuprofen (ADVIL,MOTRIN) 800 MG tablet Take 800 mg by mouth every 6 (six) hours as needed for moderate pain.     Marland Kitchen LINZESS 145 MCG CAPS capsule Take 145 mcg by mouth daily.     . nortriptyline (PAMELOR) 10 MG capsule Take 2 capsules (20 mg total) by mouth at bedtime. 60 capsule 11  . oxybutynin (DITROPAN) 5 MG tablet Take 5 mg by mouth 3 (three) times daily.    . prazosin (MINIPRESS) 2 MG capsule Take 2 mg by mouth at bedtime.    . rizatriptan (MAXALT-MLT) 5 MG disintegrating tablet Take 1 tablet (5 mg total) by mouth as needed. May repeat in 2 hours if needed 15 tablet 11  . Vitamin D, Ergocalciferol, (DRISDOL) 50000 units CAPS capsule Take 1 capsule (50,000 Units total) by mouth every 7 (seven) days. 12 capsule 0   Allergies Allergies  Allergen Reactions  . Codeine Nausea And Vomiting  . Penicillins Other (See Comments)  Unknown- childhood reaction    Review of Systems: Constitutional:  no unexpected weight changes Eye:  no recent significant change in vision Ear/Nose/Mouth/Throat:  Ears:  no tinnitus or vertigo and no recent change in hearing Nose/Mouth/Throat:  no complaints of nasal congestion, no sore throat Cardiovascular: no chest pain, +intermittent swelling Respiratory:  no cough and no shortness of breath Gastrointestinal:  no abdominal pain, no change in bowel habits GU:  Female: negative for dysuria or pelvic pain Musculoskeletal/Extremities: +handpain, otherwise no pain of the joints Integumentary (Skin/Breast):  no abnormal skin lesions reported Neurologic:  no headaches Endocrine:  denies fatigue Hematologic/Lymphatic:  No areas of  easy bleeding  Exam BP 108/72 (BP Location: Left Arm, Patient Position: Sitting, Cuff Size: Normal)   Pulse 89   Temp 98.1 F (36.7 C) (Oral)   Ht 5\' 2"  (1.575 m)   Wt 168 lb (76.2 kg)   LMP 04/24/2012   SpO2 98%   BMI 30.73 kg/m  General:  well developed, well nourished, in no apparent distress Skin:  no significant moles, warts, or growths Head:  no masses, lesions, or tenderness Eyes:  pupils equal and round, sclera anicteric without injection Ears:  canals without lesions, TMs shiny without retraction, no obvious effusion, no erythema Nose:  nares patent, septum midline, mucosa normal, and no drainage or sinus tenderness Throat/Pharynx:  lips and gingiva without lesion; tongue and uvula midline; non-inflamed pharynx; no exudates or postnasal drainage Neck: neck supple without adenopathy, thyromegaly, or masses Lungs:  clear to auscultation, breath sounds equal bilaterally, no respiratory distress Cardio:  regular rate and rhythm, no bruits, no LE edema Abdomen:  abdomen soft, nontender; bowel sounds normal; no masses or organomegaly Genital: Defer to GYN Musculoskeletal:  symmetrical muscle groups noted without atrophy or deformity Extremities:  no clubbing, cyanosis, or edema, no deformities, no skin discoloration Neuro:  gait normal; deep tendon reflexes normal and symmetric Psych: well oriented with normal range of affect and appropriate judgment/insight  Assessment and Plan  Well adult exam - Plan: Comprehensive metabolic panel, Lipid panel  CKD (chronic kidney disease) stage 2, GFR 60-89 ml/min - Plan: Ambulatory referral to Nephrology  Need for influenza vaccination - Plan: Flu Vaccine QUAD 6+ mos PF IM (Fluarix Quad PF)  Screening for breast cancer - Plan: MM DIGITAL SCREENING BILATERAL   Well 47 y.o. female. Counseled on diet and exercise. Encouraged her to lift weights (not significantly heavy due to urinary issues). Urogyn wanted her to see kidney specialist  due to slight increase of Cr and swelling, will set up in area on their behalf for reassurance.  Other orders as above.  Follow up in 6 mo. The patient voiced understanding and agreement to the plan.  [place], DO 09/08/18 10:14 AM

## 2018-09-08 NOTE — Progress Notes (Signed)
Pre visit review using our clinic review tool, if applicable. No additional management support is needed unless otherwise documented below in the visit note. 

## 2018-09-08 NOTE — Patient Instructions (Addendum)
If you do not hear anything about your referral in the next 1-2 weeks, call our office and ask for an update.  Give us 2-3 business days to get the results of your labs back.   Keep the diet clean and stay active.  Aim to do some physical exertion for 150 minutes per week. This is typically divided into 5 days per week, 30 minutes per day. The activity should be enough to get your heart rate up. Anything is better than nothing if you have time constraints.  Let us know if you need anything. 

## 2018-12-07 DIAGNOSIS — N182 Chronic kidney disease, stage 2 (mild): Secondary | ICD-10-CM | POA: Diagnosis not present

## 2018-12-14 ENCOUNTER — Ambulatory Visit: Payer: 59 | Admitting: Family Medicine

## 2018-12-14 ENCOUNTER — Encounter: Payer: Self-pay | Admitting: Family Medicine

## 2018-12-14 VITALS — BP 110/68 | HR 101 | Temp 99.1°F | Ht 62.0 in | Wt 165.0 lb

## 2018-12-14 DIAGNOSIS — B9689 Other specified bacterial agents as the cause of diseases classified elsewhere: Secondary | ICD-10-CM

## 2018-12-14 DIAGNOSIS — J208 Acute bronchitis due to other specified organisms: Secondary | ICD-10-CM

## 2018-12-14 MED ORDER — AZITHROMYCIN 250 MG PO TABS: ORAL_TABLET | ORAL | 0 refills | Status: DC

## 2018-12-14 MED ORDER — METHYLPREDNISOLONE ACETATE 80 MG/ML IJ SUSP
80.0000 mg | Freq: Once | INTRAMUSCULAR | Status: AC
  Administered 2018-12-14: 80 mg via INTRAMUSCULAR

## 2018-12-14 MED ORDER — BENZONATATE 100 MG PO CAPS: 100.0000 mg | ORAL_CAPSULE | Freq: Three times a day (TID) | ORAL | 0 refills | Status: DC | PRN

## 2018-12-14 NOTE — Patient Instructions (Signed)
Continue to push fluids, practice good hand hygiene, and cover your mouth if you cough.  If you start having fevers, shaking or shortness of breath, seek immediate care.  For symptoms, consider using Vick's VapoRub on chest or under nose, air humidifier, Benadryl at night, and elevating the head of the bed. Tylenol and ibuprofen for aches and pains you may be experiencing.   Let us know if you need anything.  

## 2018-12-14 NOTE — Progress Notes (Signed)
Chief Complaint  Patient presents with  . Cough    congestion  . Headache    Megan Gonzalez here for URI complaints.  Duration: 3 weeks  Associated symptoms: sinus headache, sinus congestion, sinus pain, sore throat and cough Denies: rhinorrhea, itchy watery eyes, red eyes, ear pain, ear drainage, wheezing, shortness of breath and fevers Treatment to date: Advil cold and sinus, Mucinex, Zycam Sick contacts: Yes - kids and husband started with it, got better w abx and inhalers  ROS:  Const: Denies fevers HEENT: As noted in HPI Lungs: +cough  Past Medical History:  Diagnosis Date  . Anemia   . Anxiety   . Arthritis    chronic low back pain  . Cancer ([place])    cervical cancer; no more uterus  . Depression   . Fecal incontinence   . Headache(784.0)   . Numbness   . Overactive bladder   . Sleep apnea    CPAP-non compliant  . Tremor     BP 110/68 (BP Location: Left Arm, Patient Position: Sitting, Cuff Size: Normal)   Pulse (!) 101   Temp 99.1 F (37.3 C) (Oral)   Ht 5\' 2"  (1.575 m)   Wt 165 lb (74.8 kg)   LMP 04/24/2012   SpO2 96%   BMI 30.18 kg/m  General: Awake, alert, appears stated age 5: AT, [place], ears patent b/l and TM's neg, R nare patent, L nare near obstructed with swollen turbinate and wo exudate, pharynx pink and without exudates, MMM Neck: No masses or asymmetry Heart: RRR Lungs: CTAB, no accessory muscle use-dry sounding cough observed during exam Psych: Age appropriate judgment and insight, normal mood and affect  Acute bacterial bronchitis - Plan: azithromycin (ZITHROMAX) 250 MG tablet, benzonatate (TESSALON) 100 MG capsule  Orders as above. IM Depo 80 mg for HA and s/s's.  Continue to push fluids, practice good hand hygiene, cover mouth when coughing. F/u prn. If starting to experience fevers, shaking, or shortness of breath, seek immediate care. Pt voiced understanding and agreement to the plan.  [place], DO 12/14/18 9:32  AM

## 2018-12-14 NOTE — Addendum Note (Signed)
Addended by: Sharon Seller B on: 12/14/2018 09:35 AM   Modules accepted: Orders

## 2018-12-16 ENCOUNTER — Ambulatory Visit: Payer: Self-pay

## 2018-12-16 MED ORDER — DOXYCYCLINE HYCLATE 100 MG PO TABS: 100.0000 mg | ORAL_TABLET | Freq: Two times a day (BID) | ORAL | 0 refills | Status: AC

## 2018-12-16 NOTE — Telephone Encounter (Signed)
Patient called, left VM to return call to the office to discuss symptoms.  She initially called to say that the sinus pressure/pain is not getting better, was seen on 12/14/18 and would like to know if Dr. Nani Ravens could call in something else to take.

## 2018-12-16 NOTE — Telephone Encounter (Signed)
Patient called and says she was seen on 12/14/18 by Dr. Nani Ravens, was given a z-pack and tessalon pearles for bronchitis. She says the coughing is not better and the sinus pain/pressure is not getting better. She says she took her last antibiotic today and is still no better. She says she's not laying around, so that's better, but she still has the body chills and her body feels warm. She says she doesn't have a thermometer. She's asking if Dr. Nani Ravens will call something in to clear of the sinus pressure/congestion/pain she's having. I advised I will send to Dr. Nani Ravens and someone will call with his recommendation.   Reason for Disposition . [1] Recent medical visit within 24 hours AND [2] condition/symptoms SAME (unchanged) AND [3] caller has additional questions triager can answer  Answer Assessment - Initial Assessment Questions 1. MAIN CONCERN OR SYMPTOM:  "What is your main concern right now?" "What question do you have?" "What's the main symptom you're worried about?" (e.g., breathing difficulty, cough, fever. pain)     Sinus pressure/pain, cough, chills, body warm 2. ONSET: "When did the symptoms start?"     Seen on 12/14/18 in the office 3. BETTER-SAME-WORSE: "Are you getting better, staying the same, or getting worse compared to how you felt at your last visit to the doctor (most recent medical visit)"?     Staying the same; pressure in my nose, pain 4. VISIT DATE: "When were you seen?" (Date)     12/14/18 5. VISIT DOCTOR: "What is the name of the doctor taking care of you now?"     Dr. Nani Ravens 6. VISIT DIAGNOSIS:  "What was the main symptom or problem that you were seen for?" "Were you given a diagnosis?"     Seen for cough, sinus pain/pressure 7. VISIT MEDICATIONS: "Did the physician order any new medicines for you to use?" If yes: "Have you filled the prescription and started taking the medicine?"      Z-Pack, tessalon pearles 8. NEXT APPOINTMENT: "Have you scheduled a follow-up  appointment with your doctor?"     No 9. PAIN: "Is there any pain?" If so, ask: "How bad is it?"  (Scale 1-10; or mild, moderate, severe)     6 10. FEVER: "Do you have a fever?" If so, ask: "What is it, how was it measured  and when did it start?"       No thermometer, but body chills, feeling warm 11. OTHER SYMPTOMS: "Do you have any other symptoms?"       No  Protocols used: RECENT MEDICAL VISIT FOR ILLNESS FOLLOW-UP CALL-A-AH

## 2018-12-16 NOTE — Telephone Encounter (Signed)
Will try doxy to cover for common sinusitis bacteria. TY.

## 2018-12-26 ENCOUNTER — Encounter: Payer: Self-pay | Admitting: Family Medicine

## 2018-12-26 ENCOUNTER — Ambulatory Visit: Payer: 59 | Admitting: Family Medicine

## 2018-12-26 VITALS — BP 120/80 | HR 78 | Temp 98.0°F | Ht 62.0 in | Wt 166.1 lb

## 2018-12-26 DIAGNOSIS — J069 Acute upper respiratory infection, unspecified: Secondary | ICD-10-CM | POA: Diagnosis not present

## 2018-12-26 DIAGNOSIS — Z111 Encounter for screening for respiratory tuberculosis: Secondary | ICD-10-CM | POA: Diagnosis not present

## 2018-12-26 MED ORDER — LEVOCETIRIZINE DIHYDROCHLORIDE 5 MG PO TABS: 5.0000 mg | ORAL_TABLET | Freq: Every evening | ORAL | 0 refills | Status: DC

## 2018-12-26 NOTE — Progress Notes (Signed)
Chief Complaint  Patient presents with  . Cough    congestion    [name] here for URI complaints.  Here for f/u. Doing better overall, still having ST and lingering cough. Finished Zpak. Did not pick up doxy because nobody called her. Denies fevers, nasal congestion, runny nose, ear pain, sob, wheezing.  ROS:  Const: Denies fevers HEENT: As noted in HPI Lungs: No SOB  Past Medical History:  Diagnosis Date  . Anemia   . Anxiety   . Arthritis    chronic low back pain  . Cancer ([place])    cervical cancer; no more uterus  . Depression   . Fecal incontinence   . Headache(784.0)   . Numbness   . Overactive bladder   . Sleep apnea    CPAP-non compliant  . Tremor     BP 120/80 (BP Location: Left Arm, Patient Position: Sitting, Cuff Size: Normal)   Pulse 78   Temp 98 F (36.7 C) (Oral)   Ht 5\' 2"  (1.575 m)   Wt 166 lb 2 oz (75.4 kg)   LMP 04/24/2012   SpO2 96%   BMI 30.38 kg/m  General: Awake, alert, appears stated age 48: AT, [place], ears patent b/l and TM's neg, nares patent w/o discharge, pharynx pink and without exudates, MMM Neck: No masses or asymmetry Heart: RRR Lungs: CTAB, no accessory muscle use Psych: Age appropriate judgment and insight, normal mood and affect  Upper respiratory tract infection, unspecified type - Plan: levocetirizine (XYZAL) 5 MG tablet  Screening-pulmonary TB - Plan: QuantiFERON-TB Gold Plus  Mucinex and Xyzal to help dry things up. Likely pnd. As she is improving, will have her hold off on filling doxy. Continue to push fluids, practice good hand hygiene, cover mouth when coughing.  Will get screening for her job. Form to be filled out to, she will send via MyChart.  F/u prn. If starting to experience fevers, shaking, or shortness of breath, seek immediate care. Pt voiced understanding and agreement to the plan.  [place], DO 12/26/18 12:12 PM

## 2018-12-26 NOTE — Patient Instructions (Addendum)
Continue to push fluids, practice good hand hygiene, and cover your mouth if you cough.  If you start having fevers or worsening symptoms, fill the doxycycline that was called in on 2/21. I do not think you need it otherwise.  Send me your form for school.  For symptoms, consider using Vick's VapoRub on chest or under nose, air humidifier, Benadryl at night, and elevating the head of the bed. Tylenol and ibuprofen for sore throat.  Mucinex and Claritin/Zytec/Allegra/Xyzal to help dry up your symptoms.   OK to take Tylenol 1000 mg (2 extra strength tabs) or 975 mg (3 regular strength tabs) every 6 hours as needed.  Ibuprofen 400-600 mg (2-3 over the counter strength tabs) every 6 hours as needed for pain.   Let us know if you need anything.

## 2018-12-28 LAB — QUANTIFERON-TB GOLD PLUS
MITOGEN-NIL: 7.91 [IU]/mL
NIL: 0.02 [IU]/mL
QUANTIFERON-TB GOLD PLUS: NEGATIVE
TB1-NIL: 0.02 [IU]/mL
TB2-NIL: 0.01 [IU]/mL

## 2018-12-30 ENCOUNTER — Encounter: Payer: Self-pay | Admitting: Family Medicine

## 2018-12-30 ENCOUNTER — Ambulatory Visit: Payer: 59 | Admitting: Family Medicine

## 2018-12-30 VITALS — BP 122/82 | HR 90 | Temp 97.9°F | Ht 62.0 in | Wt 168.0 lb

## 2018-12-30 DIAGNOSIS — R1031 Right lower quadrant pain: Secondary | ICD-10-CM

## 2018-12-30 MED ORDER — OXYCODONE HCL 5 MG PO TABS: 5.0000 mg | ORAL_TABLET | Freq: Four times a day (QID) | ORAL | 0 refills | Status: DC | PRN

## 2018-12-30 NOTE — Progress Notes (Signed)
CC: Pain  Subjective: Patient is a 48 y.o. female here for pelvic pain.  Patient has a history of intermittent right-sided pelvic pain.  She has a history of a cyst following a total abdominal hysterectomy.  She has an appointment with the gynecology team who performed the surgery in 6 days.  The pain comes and goes and was very severe recently.  She almost went to the emergency department and presents today for a pain management plan until she can get in with the GYN team.  She has been taking 1200 mg of ibuprofen every 8 hours.  No recent injury or change in activity.  She has been more gassy and having more abdominal distention.  Denies any fevers, nausea, vomiting, or new bladder concerns.  ROS: GI: +pain Const: no fevers  Past Medical History:  Diagnosis Date  . Anemia   . Anxiety   . Arthritis    chronic low back pain  . Cancer ([place])    cervical cancer; no more uterus  . Depression   . Fecal incontinence   . Headache(784.0)   . Numbness   . Overactive bladder   . Sleep apnea    CPAP-non compliant  . Tremor     Objective: BP 122/82 (BP Location: Left Arm, Patient Position: Sitting, Cuff Size: Normal)   Pulse 90   Temp 97.9 F (36.6 C) (Oral)   Ht 5\' 2"  (1.575 m)   Wt 168 lb (76.2 kg)   LMP 04/24/2012   SpO2 99%   BMI 30.73 kg/m  General: Awake, appears stated age HEENT: MMM GI: BS+, S, TTP in RLQ, no guarding (neg heelstrike) MSK: +TTP over R hip flexor, neg [place]inchfield and log roll Heart: RRR Lungs: CTAB, no rales, wheezes or rhonchi. No accessory muscle use Psych: Age appropriate judgment and insight, normal affect and mood  Assessment and Plan: Right lower quadrant pain - Plan: oxyCODONE (OXY IR/ROXICODONE) 5 MG immediate release tablet  Ibuprofen Tylenol every 6 hours, can stagger.  Oxycodone for breakthrough pain.  This is 1 of the few opiates that she is able to take without excessive nausea and vomiting.  She has an appointment with her GYN team next  Thursday.  If no improvement, will consider reevaluating for a musculoskeletal cause and obtaining a CT scan. The patient voiced understanding and agreement to the plan.  [place], DO 12/30/18  4:57 PM

## 2018-12-30 NOTE — Patient Instructions (Addendum)
OK to take Tylenol 1000 mg (2 extra strength tabs) or 975 mg (3 regular strength tabs) every 6 hours as needed.  Ibuprofen 400-600 mg (2-3 over the counter strength tabs) every 6 hours as needed for pain.  You can stack this every 3 hours (Tylenol at noon, ibuprofen at 3 PM, Tylenol 6 PM).   Stretch your hip flexors.   Heat (pad or rice pillow in microwave) over affected area, 10-15 minutes twice daily.   Ice/cold pack over area for 10-15 min twice daily.  If the GYN team does not think it is related to your cyst, let me know.   Let us know if you need anything.

## 2019-01-05 DIAGNOSIS — R102 Pelvic and perineal pain: Secondary | ICD-10-CM | POA: Diagnosis not present

## 2019-01-05 DIAGNOSIS — R1031 Right lower quadrant pain: Secondary | ICD-10-CM | POA: Diagnosis not present

## 2019-01-13 DIAGNOSIS — R1031 Right lower quadrant pain: Secondary | ICD-10-CM | POA: Diagnosis not present

## 2019-01-16 ENCOUNTER — Encounter: Payer: Self-pay | Admitting: Family Medicine

## 2019-01-16 ENCOUNTER — Other Ambulatory Visit: Payer: Self-pay | Admitting: Family Medicine

## 2019-01-16 DIAGNOSIS — M25559 Pain in unspecified hip: Secondary | ICD-10-CM

## 2019-01-18 ENCOUNTER — Ambulatory Visit: Payer: Self-pay

## 2019-01-18 ENCOUNTER — Ambulatory Visit: Payer: 59 | Admitting: Family Medicine

## 2019-01-18 ENCOUNTER — Encounter: Payer: Self-pay | Admitting: Family Medicine

## 2019-01-18 ENCOUNTER — Other Ambulatory Visit: Payer: Self-pay

## 2019-01-18 VITALS — BP 146/87 | HR 83 | Temp 98.3°F | Ht 62.0 in | Wt 163.0 lb

## 2019-01-18 DIAGNOSIS — M25551 Pain in right hip: Secondary | ICD-10-CM | POA: Diagnosis not present

## 2019-01-18 NOTE — Patient Instructions (Addendum)
I would like to see the images of your CT scan - please get these on a disc and bring for me to look at - the one I have access to is from 2018. Do home exercises and stretches for your hip flexor. However, the level of pain you're in and difficulty walking do not suggest this is your main problem. If the CT appears normal, would consider having you see neurosurgeon for evaluation. Salon pas patches topically up to 4 times a day. Continue gabapentin, ibuprofen, nortriptyline, oxycodone.

## 2019-01-18 NOTE — Progress Notes (Signed)
PCP: Shelda Pal, DO  Subjective:   HPI: Patient is a 48 y.o. female here for right hip pain.  Patient reports she's had off and on right hip pain for 8 years. Pain worse in past 2 months and anterior. Pain 7/10 and sharp, worse with bearing weight. Can also feel a sharp squeezing pain out of the blue in this area. No left sided symptoms. Reports doing physical therapy in past. She has history of chronic pelvic pain, low back pain as well. No bowel/bladder dysfunction (h/o overactive bladder).  Past Medical History:  Diagnosis Date  . Anemia   . Anxiety   . Arthritis    chronic low back pain  . Cancer ([place])    cervical cancer; no more uterus  . Depression   . Fecal incontinence   . Headache(784.0)   . Numbness   . Overactive bladder   . Sleep apnea    CPAP-non compliant  . Tremor     Current Outpatient Medications on File Prior to Visit  Medication Sig Dispense Refill  . benzonatate (TESSALON) 100 MG capsule Take 1 capsule (100 mg total) by mouth 3 (three) times daily as needed. 30 capsule 0  . buPROPion (WELLBUTRIN XL) 300 MG 24 hr tablet take 300mg  by mouth daily  2  . estradiol (VIVELLE-DOT) 0.025 MG/24HR Place 1 patch onto the skin 2 (two) times a week.    . fluticasone (FLONASE) 50 MCG/ACT nasal spray Place 2 sprays into both nostrils daily.     Marland Kitchen gabapentin (NEURONTIN) 300 MG capsule Take 1 capsule (300 mg total) by mouth 3 (three) times daily. 90 capsule 5  . ibuprofen (ADVIL,MOTRIN) 800 MG tablet Take 800 mg by mouth every 6 (six) hours as needed for moderate pain.     Marland Kitchen levocetirizine (XYZAL) 5 MG tablet Take 1 tablet (5 mg total) by mouth every evening. 30 tablet 0  . LINZESS 145 MCG CAPS capsule Take 145 mcg by mouth daily.     . nortriptyline (PAMELOR) 10 MG capsule Take 2 capsules (20 mg total) by mouth at bedtime. 60 capsule 11  . oxybutynin (DITROPAN) 5 MG tablet Take 5 mg by mouth 3 (three) times daily.    Marland Kitchen oxyCODONE (OXY IR/ROXICODONE) 5 MG  immediate release tablet Take 1 tablet (5 mg total) by mouth every 6 (six) hours as needed for severe pain. 15 tablet 0  . prazosin (MINIPRESS) 2 MG capsule Take 2 mg by mouth at bedtime.    . rizatriptan (MAXALT-MLT) 5 MG disintegrating tablet Take 1 tablet (5 mg total) by mouth as needed. May repeat in 2 hours if needed 15 tablet 11  . Vitamin D, Ergocalciferol, (DRISDOL) 50000 units CAPS capsule Take 1 capsule (50,000 Units total) by mouth every 7 (seven) days. 12 capsule 0   No current facility-administered medications on file prior to visit.     Past Surgical History:  Procedure Laterality Date  . ABDOMINAL HYSTERECTOMY    . APPENDECTOMY  08/25/2017  . bladder neurostimulator    . COLONOSCOPY  1999  . DILATION AND CURETTAGE OF UTERUS    . FOOT SURGERY  2006   left  . INCISION AND DRAINAGE ABSCESS N/A 11/19/2013   Procedure: POSTERIOR CULPOTOMY (INCISION AND DRAINAGE OF POST OPERATIVE CUFF HEMATOMA);  Surgeon: Jonnie Kind, MD;  Location: [facility];  Service: Gynecology;  Laterality: N/A;  . LAPAROSCOPIC APPENDECTOMY N/A 08/26/2017   Procedure: APPENDECTOMY LAPAROSCOPIC;  Surgeon: Jackolyn Confer, MD;  Location: [facility];  Service:  General;  Laterality: N/A;  . PUBOVAGINAL SLING  03/24/2012   Procedure: Gaynelle Arabian;  Surgeon: Ailene Rud, MD;  Location: [facility];  Service: Urology;  Laterality: N/A;  lynx with Chemical engineer   . REMOVAL OF URINARY SLING    . SVD     x 3  . TONSILLECTOMY  as child  . URETERAL EXPLORATION  5./30/13  . VAGINAL HYSTERECTOMY N/A 1/2/015   [employer], Dr Linus Mako and Dr Leory Plowman, with release of Pubovag sling    Allergies  Allergen Reactions  . Codeine Nausea And Vomiting  . Penicillins Other (See Comments)    Unknown- childhood reaction    Social History   Socioeconomic History  . Marital status: Married    Spouse name: Not on file  . Number of children: 3  . Years of education: Masters  . Highest education level: Not on  file  Occupational History  . Occupation: Tutoring part-time  Social Needs  . Financial resource strain: Not on file  . Food insecurity:    Worry: Not on file    Inability: Not on file  . Transportation needs:    Medical: Not on file    Non-medical: Not on file  Tobacco Use  . Smoking status: Never Smoker  . Smokeless tobacco: Never Used  Substance and Sexual Activity  . Alcohol use: No  . Drug use: No  . Sexual activity: Not on file  Lifestyle  . Physical activity:    Days per week: Not on file    Minutes per session: Not on file  . Stress: Not on file  Relationships  . Social connections:    Talks on phone: Not on file    Gets together: Not on file    Attends religious service: Not on file    Active member of club or organization: Not on file    Attends meetings of clubs or organizations: Not on file    Relationship status: Not on file  . Intimate partner violence:    Fear of current or ex partner: Not on file    Emotionally abused: Not on file    Physically abused: Not on file    Forced sexual activity: Not on file  Other Topics Concern  . Not on file  Social History Narrative   Lives at home with husband and children.   Right-handed.   2 cups caffeine per day.    Family History  Problem Relation Age of Onset  . Cancer Mother        Breast cancer  . Thyroid disease Mother   . Hypotension Mother   . Other Father        unsure of history  . Anesthesia problems Neg Hx     BP (!) 146/87   Pulse 83   Temp 98.3 F (36.8 C) (Oral)   Ht 5\' 2"  (1.575 m)   Wt 163 lb (73.9 kg)   LMP 04/24/2012   BMI 29.81 kg/m   Review of Systems: See HPI above.     Objective:  Physical Exam:  Gen: NAD, comfortable in exam room  Back: No gross deformity, scoliosis. No paraspinal TTP .  No midline or bony TTP. FROM. Strength LEs 5/5 all muscle groups.   2+ MSRs in patellar and achilles tendons, equal bilaterally. Negative SLRs. Sensation intact to light touch  bilaterally.  Right hip: No deformity. FROM with 5/5 strength all motions. Tenderness to palpation anterior hip over hip flexor NVI distally. Pain with  logroll.   Negative fabers and piriformis stretches.   MSK u/s right hip:  No evidence arthropathy or effusion.  No cortical irregularities visualized of proximal femur.  Assessment & Plan:  1. Right hip pain - patient's history and exam consistent with hip flexor strain aside for level of severity she's experiencing.  Ultrasound is reassuring.  She recently had a CT of abdomen and pelvis - will try to get images sent here to review and ensure no pathology, stress fracture.  In meantime salon pas patches, gabapentin, ibuprofen, nortriptyline, oxycodone as she's been taking.  Consider referral pain from lumbar spine if CT is negative as well.

## 2019-01-23 ENCOUNTER — Other Ambulatory Visit: Payer: Self-pay | Admitting: Family Medicine

## 2019-01-23 DIAGNOSIS — J069 Acute upper respiratory infection, unspecified: Secondary | ICD-10-CM

## 2019-01-26 DIAGNOSIS — R102 Pelvic and perineal pain: Secondary | ICD-10-CM | POA: Diagnosis not present

## 2019-01-26 DIAGNOSIS — M62838 Other muscle spasm: Secondary | ICD-10-CM | POA: Diagnosis not present

## 2019-03-01 ENCOUNTER — Other Ambulatory Visit: Payer: Self-pay | Admitting: Family Medicine

## 2019-03-01 DIAGNOSIS — J069 Acute upper respiratory infection, unspecified: Secondary | ICD-10-CM

## 2019-03-10 ENCOUNTER — Encounter: Payer: Self-pay | Admitting: Family Medicine

## 2019-03-10 ENCOUNTER — Ambulatory Visit (INDEPENDENT_AMBULATORY_CARE_PROVIDER_SITE_OTHER): Payer: 59 | Admitting: Family Medicine

## 2019-03-10 ENCOUNTER — Other Ambulatory Visit: Payer: Self-pay

## 2019-03-10 DIAGNOSIS — E559 Vitamin D deficiency, unspecified: Secondary | ICD-10-CM | POA: Diagnosis not present

## 2019-03-10 DIAGNOSIS — E7800 Pure hypercholesterolemia, unspecified: Secondary | ICD-10-CM | POA: Diagnosis not present

## 2019-03-10 DIAGNOSIS — J302 Other seasonal allergic rhinitis: Secondary | ICD-10-CM | POA: Diagnosis not present

## 2019-03-10 MED ORDER — FLUTICASONE PROPIONATE 50 MCG/ACT NA SUSP: 2.0000 | Freq: Every day | NASAL | 5 refills | Status: DC

## 2019-03-10 MED ORDER — LEVOCETIRIZINE DIHYDROCHLORIDE 5 MG PO TABS: ORAL_TABLET | ORAL | 5 refills | Status: DC

## 2019-03-10 MED ORDER — VITAMIN D (ERGOCALCIFEROL) 1.25 MG (50000 UNIT) PO CAPS: 50000.0000 [IU] | ORAL_CAPSULE | ORAL | 0 refills | Status: DC

## 2019-03-10 NOTE — Progress Notes (Signed)
Chief Complaint  Patient presents with  . Follow-up    Subjective: Patient is a 48 y.o. female here for med ck. Due to COVID-19 pandemic, we are interacting via web portal for an electronic face-to-face visit. I verified patient's ID using 2 identifiers. Patient agreed to proceed with visit via this method. Patient is at home, I am at office. Patient and I are present for visit.   Hx of allergies. Taking Xyzal 5 mg/d. Feels like it is not helping, requesting to start Flonase again. Does well with this combo. Still waking up gasping for air, feeling she is sob. No drainage and no effect from starting Xyzal. She does have OSA on CPAP, last titrated 6 mo ago, this issue started 3-4 mo ago.  Hx of Vit D def. Taking 50k u/week, helps with her menopausal s/s's. Reports compliance.  Cholesterol high at last CPE in Nov, 2019. Would like to have this checked again. Diet is fair.   ROS: Lungs: +nocturnal dyspnea Const: no fevers  Past Medical History:  Diagnosis Date  . Anemia   . Anxiety   . Arthritis    chronic low back pain  . Cancer ([place])    cervical cancer; no more uterus  . Depression   . Fecal incontinence   . Headache(784.0)   . Numbness   . Overactive bladder   . Sleep apnea    CPAP-non compliant  . Tremor     Objective: No conversational dyspnea Age appropriate judgment and insight Nml affect and mood  Assessment and Plan: Seasonal allergies - Plan: levocetirizine (XYZAL) 5 MG tablet, fluticasone (FLONASE) 50 MCG/ACT nasal spray  Vitamin D deficiency - Plan: Vitamin D, Ergocalciferol, (DRISDOL) 1.25 MG (50000 UT) CAPS capsule, Vitamin D (25 hydroxy)  Pure hypercholesterolemia - Plan: Lipid panel  Reorder INCS. Needs to contact sleep team on gasping for air at night. May need CPAP adjusted.  Refill Vit D, ck labs. If labs are normal, will see her in 6 mo for CPE. The patient voiced understanding and agreement to the plan.  [place], DO 03/10/19   10:21 AM

## 2019-03-16 ENCOUNTER — Other Ambulatory Visit (INDEPENDENT_AMBULATORY_CARE_PROVIDER_SITE_OTHER): Payer: 59

## 2019-03-16 ENCOUNTER — Other Ambulatory Visit: Payer: Self-pay

## 2019-03-16 DIAGNOSIS — E559 Vitamin D deficiency, unspecified: Secondary | ICD-10-CM | POA: Diagnosis not present

## 2019-03-16 DIAGNOSIS — E7800 Pure hypercholesterolemia, unspecified: Secondary | ICD-10-CM

## 2019-03-16 LAB — LIPID PANEL
Cholesterol: 202 mg/dL — ABNORMAL HIGH (ref 0–200)
HDL: 52.7 mg/dL (ref >39.00)
LDL Cholesterol: 137 mg/dL — ABNORMAL HIGH (ref 0–99)
NonHDL: 149.54
Total CHOL/HDL Ratio: 4
Triglycerides: 62 mg/dL (ref 0.0–149.0)
VLDL: 12.4 mg/dL (ref 0.0–40.0)

## 2019-03-16 LAB — VITAMIN D 25 HYDROXY (VIT D DEFICIENCY, FRACTURES): VITD: 45.53 ng/mL (ref 30.00–100.00)

## 2019-04-13 ENCOUNTER — Ambulatory Visit: Payer: Self-pay | Admitting: Family Medicine

## 2019-04-13 ENCOUNTER — Ambulatory Visit (INDEPENDENT_AMBULATORY_CARE_PROVIDER_SITE_OTHER)
Admission: EM | Admit: 2019-04-13 | Discharge: 2019-04-13 | Disposition: A | Discharge status: Home / Self Care | Payer: 59 | Source: Home / Self Care

## 2019-04-13 ENCOUNTER — Other Ambulatory Visit: Payer: Self-pay

## 2019-04-13 ENCOUNTER — Telehealth: Payer: Self-pay | Admitting: *Deleted

## 2019-04-13 ENCOUNTER — Ambulatory Visit
Admission: RE | Admit: 2019-04-13 | Discharge: 2019-04-13 | Disposition: A | Discharge status: Home / Self Care | Payer: 59 | Source: Ambulatory Visit

## 2019-04-13 ENCOUNTER — Telehealth: Payer: 59 | Admitting: Family

## 2019-04-13 DIAGNOSIS — R5383 Other fatigue: Secondary | ICD-10-CM | POA: Diagnosis not present

## 2019-04-13 DIAGNOSIS — R067 Sneezing: Secondary | ICD-10-CM | POA: Diagnosis not present

## 2019-04-13 DIAGNOSIS — B349 Viral infection, unspecified: Secondary | ICD-10-CM

## 2019-04-13 DIAGNOSIS — R05 Cough: Secondary | ICD-10-CM

## 2019-04-13 DIAGNOSIS — Z20822 Contact with and (suspected) exposure to covid-19: Secondary | ICD-10-CM

## 2019-04-13 DIAGNOSIS — R197 Diarrhea, unspecified: Secondary | ICD-10-CM

## 2019-04-13 DIAGNOSIS — M791 Myalgia, unspecified site: Secondary | ICD-10-CM | POA: Diagnosis not present

## 2019-04-13 DIAGNOSIS — R059 Cough, unspecified: Secondary | ICD-10-CM

## 2019-04-13 DIAGNOSIS — Z20828 Contact with and (suspected) exposure to other viral communicable diseases: Secondary | ICD-10-CM

## 2019-04-13 MED ORDER — BENZONATATE 100 MG PO CAPS: 100.0000 mg | ORAL_CAPSULE | Freq: Three times a day (TID) | ORAL | 0 refills | Status: DC | PRN

## 2019-04-13 MED ORDER — ALBUTEROL SULFATE HFA 108 (90 BASE) MCG/ACT IN AERS: 2.0000 | INHALATION_SPRAY | RESPIRATORY_TRACT | 2 refills | Status: DC | PRN

## 2019-04-13 MED ORDER — FLUTICASONE PROPIONATE 50 MCG/ACT NA SUSP: 1.0000 | Freq: Every day | NASAL | 2 refills | Status: DC

## 2019-04-13 NOTE — Progress Notes (Signed)
Greater than 5 minutes, yet less than 10 minutes of time have been spent researching, coordinating, and implementing care for this patient today.  Thank you for the details you included in the comment boxes. Those details are very helpful in determining the best course of treatment for you and help us to provide the best care.  E-Visit for Corona Virus Screening   Your current symptoms could be consistent with the coronavirus.  Call your health care provider or local health department to request and arrange formal testing. Many health care providers can now test patients at their office but not all are.  Please quarantine yourself while awaiting your test results.  [facility] [facility] [phone], [place] [phone], [facility] [phone] or visit [email or web address]-[email or web address]/testing/[email or web address]  and You have been enrolled in MyChart Home Monitoring for COVID-19.  Daily you will receive a questionnaire within the MyChart website. Our COVID-19 response team will be monitoring your responses daily.    COVID-19 is a respiratory illness with symptoms that are similar to the flu. Symptoms are typically mild to moderate, but there have been cases of severe illness and death due to the virus. The following symptoms may appear 2-14 days after exposure: . Fever . Cough . Shortness of breath or difficulty breathing . Chills . Repeated shaking with chills . Muscle pain . Headache . Sore throat . New loss of taste or smell . Fatigue . Congestion or runny nose . Nausea or vomiting . Diarrhea  It is vitally important that if you feel that you have an infection such as this virus or any other virus that you stay home and away from places where you may spread it to others.  You should self-quarantine for 14 days if you have symptoms that could potentially be coronavirus or have been in close contact a with a  person diagnosed with COVID-19 within the last 2 weeks. You should avoid contact with people age 65 and older.   You should wear a mask or cloth face covering over your nose and mouth if you must be around other people or animals, including pets (even at home). Try to stay at least 6 feet away from other people. This will protect the people around you.  You can use medication such as A prescription cough medication called Tessalon Perles 100 mg. You may take 1-2 capsules every 8 hours as needed for cough and A prescription inhaler called Albuterol MDI 90 mcg /actuation 2 puffs every 4 hours as needed for shortness of breath, wheezing, cough  You may also take acetaminophen (Tylenol) as needed for fever.   Reduce your risk of any infection by using the same precautions used for avoiding the common cold or flu:  . Wash your hands often with soap and warm water for at least 20 seconds.  If soap and water are not readily available, use an alcohol-based hand sanitizer with at least 60% alcohol.  . If coughing or sneezing, cover your mouth and nose by coughing or sneezing into the elbow areas of your shirt or coat, into a tissue or into your sleeve (not your hands). . Avoid shaking hands with others and consider head nods or verbal greetings only. . Avoid touching your eyes, nose, or mouth with unwashed hands.  . Avoid close contact with people who are sick. . Avoid places or events with large numbers of people in one location, like concerts or sporting events. . Carefully consider travel plans you have or   are making. . If you are planning any travel outside or inside the [place], visit the CDC's Travelers' Health webpage for the latest health notices. . If you have some symptoms but not all symptoms, continue to monitor at home and seek medical attention if your symptoms worsen. . If you are having a medical emergency, call 911.  HOME CARE . Only take medications as instructed by your medical  team. . Drink plenty of fluids and get plenty of rest. . A steam or ultrasonic humidifier can help if you have congestion.   GET HELP RIGHT AWAY IF YOU HAVE EMERGENCY WARNING SIGNS** FOR COVID-19. If you or someone is showing any of these signs seek emergency medical care immediately. Call 911 or proceed to your closest emergency facility if: . You develop worsening high fever. . Trouble breathing . Bluish lips or face . Persistent pain or pressure in the chest . New confusion . Inability to wake or stay awake . You cough up blood. . Your symptoms become more severe  **This list is not all possible symptoms. Contact your medical provider for any symptoms that are sever or concerning to you.   MAKE SURE YOU   Understand these instructions.  Will watch your condition.  Will get help right away if you are not doing well or get worse.  Your e-visit answers were reviewed by a board certified advanced clinical practitioner to complete your personal care plan.  Depending on the condition, your plan could have included both over the counter or prescription medications.  If there is a problem please reply once you have received a response from your provider.  Your safety is important to us.  If you have drug allergies check your prescription carefully.    You can use MyChart to ask questions about today's visit, request a non-urgent call back, or ask for a work or school excuse for 24 hours related to this e-Visit. If it has been greater than 24 hours you will need to follow up with your provider, or enter a new e-Visit to address those concerns. You will get an e-mail in the next two days asking about your experience.  I hope that your e-visit has been valuable and will speed your recovery. Thank you for using e-visits.    

## 2019-04-13 NOTE — Telephone Encounter (Signed)
Called and the patient was currently on the phone with [facility] scheduling a Virtual Visit today. PCP off today.

## 2019-04-13 NOTE — ED Provider Notes (Addendum)
Virtual Visit via Video Note:  Megan Gonzalez  initiated request for Telemedicine visit with [facility] team. I connected with Megan Gonzalez  on 04/13/2019 at 12:17 PM  for a synchronized telemedicine visit using a video enabled HIPPA compliant telemedicine application. I verified that I am speaking with Megan Gonzalez  using two identifiers. Megan Coller C Kiyanna Biegler, PA-C  was physically located in a [facility] Urgent care site and Megan Gonzalez was located at a different location.   The limitations of evaluation and management by telemedicine as well as the availability of in-person appointments were discussed. Patient was informed that she  may incur a bill ( including co-pay) for this virtual visit encounter. Megan Gonzalez  expressed understanding and gave verbal consent to proceed with virtual visit.     History of Present Illness:Megan Gonzalez  is a 48 y.o. female history of anxiety, cervical cancer, sleep apnea, presenting today for evaluation of possible COVID.  Patient states that over the past few days since Monday she has developed body aches, fatigue, diarrhea and sneezing.  She has had very minimal cough.  She notes that her niece has tested positive for COVID and she has been around her twice in the past week.  She denies any fevers.  Denies any chest pain or shortness of breath.  She has taken some ibuprofen to help with the body aches.  She also takes daily Xyzal for allergies.  Past Medical History:  Diagnosis Date  . Anemia   . Anxiety   . Arthritis    chronic low back pain  . Cancer ([place])    cervical cancer; no more uterus  . Depression   . Fecal incontinence   . Headache(784.0)   . Numbness   . Overactive bladder   . Sleep apnea    CPAP-non compliant  . Tremor     Allergies  Allergen Reactions  . Codeine Nausea And Vomiting  . Penicillins Other (See Comments)    Unknown- childhood reaction         Observations/Objective:  Physical Exam  Constitutional: She is oriented to person, place, and time and well-developed, well-nourished, and in no distress. No distress.  HENT:  Head: Normocephalic and atraumatic.  Nose: Nose normal.  Eyes: Conjunctivae are normal.  Neck: Normal range of motion. Neck supple.  Pulmonary/Chest: Effort normal. No respiratory distress.  Speaking in full sentences, no coughing or sneezing during video visit  Musculoskeletal: Normal range of motion.     Comments: Using all extremities appropriately  Neurological: She is alert and oriented to person, place, and time.  Speech clear, face symmetric    Assessment and Plan: Patient with URI symptoms, likely viral URI.  Known exposure to COVID will set COVID testing up, information sent [facility] to [facility].  Recommending continued symptomatic and supportive care, symptoms not suggestive of pneumonia at this time.  Continue Xyzal, add in Flonase.  Continue Tylenol for body aches, supplement with ibuprofen as needed.  Follow-up if developing increased fatigue, dizziness, fevers or shortness of breath.  Recommended quarantining until results returned.  Discussed strict return precautions. Patient verbalized understanding and is agreeable with plan.   Follow Up Instructions:    I discussed the assessment and treatment plan with the patient. The patient was provided an opportunity to ask questions and all were answered. The patient agreed with the plan and demonstrated an understanding of the instructions.   The patient was advised to call back or seek an in-person evaluation if the  symptoms worsen or if the condition fails to improve as anticipated.      Janith Lima, PA-C  04/13/2019 12:17 PM         Janith Lima, PA-C 04/14/19 0843    Janith Lima, PA-C 04/14/19 [phone]

## 2019-04-13 NOTE — Telephone Encounter (Signed)
Pt called back for scheduling, scheduled at [facility] for tomorrow at [place] by Debara Pickett, PA-C  Pt with body aches, fatigue, diarrhea, known exposure.  Testing process reviewed, stay in car, wear mask as well as any passengers in vehicle.  Pt verbalizes understanding  Pts CB [phone]

## 2019-04-13 NOTE — Telephone Encounter (Signed)
Left voicemail for patient to call [phone] M-F [phone] to schedule an appt for covid testing. Order placed

## 2019-04-13 NOTE — Telephone Encounter (Signed)
Pt. Reports her niece has tested positive for COVID 19 this week.She had been with her this past week. Today pt, has some fatigue, had diarrhea yesterday. Request a virtual visit with Dr. Nani Ravens. Contact number [phone].  Answer Assessment - Initial Assessment Questions 1. CLOSE CONTACT: "Who is the person with the confirmed or suspected COVID-19 infection that you were exposed to?"     Niece tested positive this week 2. PLACE of CONTACT: "Where were you when you were exposed to COVID-19?" (e.g., home, school, medical waiting room; which city?)     Home 3. TYPE of CONTACT: "How much contact was there?" (e.g., sitting next to, live in same house, work in same office, same building)     Home  4. DURATION of CONTACT: "How long were you in contact with the COVID-19 patient?" (e.g., a few seconds, passed by person, a few minutes, live with the patient)     With in the past week 5. DATE of CONTACT: "When did you have contact with a COVID-19 patient?" (e.g., how many days ago)     This past week 6. TRAVEL: "Have you traveled out of the country recently?" If so, "When and where?"     * Also ask about out-of-state travel, since the CDC has identified some high-risk cities for community spread in the [place].     * Note: Travel becomes less relevant if there is widespread community transmission where the patient lives.     No 7. COMMUNITY SPREAD: "Are there lots of cases of COVID-19 (community spread) where you live?" (See public health department website, if unsure)       Yes 8. SYMPTOMS: "Do you have any symptoms?" (e.g., fever, cough, breathing difficulty)     Fatigue, diarrhea yesterday 9. PREGNANCY OR POSTPARTUM: "Is there any chance you are pregnant?" "When was your last menstrual period?" "Did you deliver in the last 2 weeks?"     No 10. HIGH RISK: "Do you have any heart or lung problems? Do you have a weak immune system?" (e.g., CHF, COPD, asthma, HIV positive, chemotherapy, renal failure,  diabetes mellitus, sickle cell anemia)       HTN  Protocols used: CORONAVIRUS (COVID-19) EXPOSURE-A-AH

## 2019-04-13 NOTE — Telephone Encounter (Signed)
-----   Message from Janith Lima, PA-C sent at 04/13/2019 12:12 PM EDT ----- Regarding: Needs COVID testing Body aches, fatigue, diarrhea, known exposure

## 2019-04-13 NOTE — Discharge Instructions (Signed)
Someone will be in contact with you about COVId testing  [address]  Limit contact with others until results return Drink plenty of fluid Tylenol for body aches, fever, ibuprofen if not controlled with tylenol alone Continue xyzal, add in flonase  Follow up if symptoms wornseing or developing shortness of breath, fevers, weakness

## 2019-04-14 ENCOUNTER — Other Ambulatory Visit: Payer: 59

## 2019-04-14 DIAGNOSIS — Z20822 Contact with and (suspected) exposure to covid-19: Secondary | ICD-10-CM

## 2019-04-19 ENCOUNTER — Telehealth: Payer: Self-pay | Admitting: Family Medicine

## 2019-04-19 LAB — NOVEL CORONAVIRUS, NAA: SARS-CoV-2, NAA: NOT DETECTED

## 2019-04-19 NOTE — Telephone Encounter (Signed)
Pt. Given COVID 19 results. Verbalizes understanding. 

## 2019-05-01 ENCOUNTER — Encounter: Payer: Self-pay | Admitting: Family Medicine

## 2019-05-04 ENCOUNTER — Other Ambulatory Visit: Payer: Self-pay

## 2019-05-05 ENCOUNTER — Ambulatory Visit: Payer: 59 | Admitting: Family Medicine

## 2019-05-05 ENCOUNTER — Encounter: Payer: Self-pay | Admitting: Family Medicine

## 2019-05-05 VITALS — BP 120/60 | HR 108 | Temp 98.9°F | Wt 151.0 lb

## 2019-05-05 DIAGNOSIS — N760 Acute vaginitis: Secondary | ICD-10-CM

## 2019-05-05 DIAGNOSIS — R109 Unspecified abdominal pain: Secondary | ICD-10-CM | POA: Diagnosis not present

## 2019-05-05 DIAGNOSIS — B9689 Other specified bacterial agents as the cause of diseases classified elsewhere: Secondary | ICD-10-CM | POA: Diagnosis not present

## 2019-05-05 MED ORDER — METRONIDAZOLE 500 MG PO TABS: 500.0000 mg | ORAL_TABLET | Freq: Two times a day (BID) | ORAL | 0 refills | Status: AC

## 2019-05-05 MED ORDER — METHYLPREDNISOLONE ACETATE 80 MG/ML IJ SUSP
80.0000 mg | Freq: Once | INTRAMUSCULAR | Status: AC
  Administered 2019-05-05: 80 mg via INTRAMUSCULAR

## 2019-05-05 MED ORDER — CYCLOBENZAPRINE HCL 10 MG PO TABS: 5.0000 mg | ORAL_TABLET | Freq: Three times a day (TID) | ORAL | 0 refills | Status: DC | PRN

## 2019-05-05 NOTE — Progress Notes (Signed)
Musculoskeletal Exam  Patient: Megan Gonzalez DOB: [birth year]  DOS: 05/05/2019  SUBJECTIVE:  Chief Complaint:   Chief Complaint  Patient presents with  . Fall    Megan Gonzalez is a 48 y.o.  female for evaluation and treatment of her side pain  Onset:  6 days ago.  Fell on R side- no dizziness, lightheadedness, pain, tripping, or weakness.  Location: Side Character:  aching and stiff  Progression of issue:  is unchanged Associated symptoms: swelling Treatment: to date has been ice and Biofreeze.   Neurovascular symptoms: no  Having vaginal odor for several weeks. +hx of BV but had been tx'd for yeast? No itching or redness.    ROS: Musculoskeletal/Extremities: +back pain Neurologic: no numbness, tingling no weakness   Past Medical History:  Diagnosis Date  . Anemia   . Anxiety   . Arthritis    chronic low back pain  . Cancer ([place])    cervical cancer; no more uterus  . Depression   . Fecal incontinence   . Headache(784.0)   . Numbness   . Overactive bladder   . Sleep apnea    CPAP-non compliant  . Tremor     Objective:  VITAL SIGNS: BP 120/60 (BP Location: Left Arm, Patient Position: Sitting, Cuff Size: Normal)   Pulse (!) 108   Temp 98.9 F (37.2 C) (Oral)   Wt 151 lb (68.5 kg)   LMP 04/24/2012   SpO2 99%   BMI 27.62 kg/m  Constitutional: Well formed, well developed. No acute distress. HENT: Normocephalic, atraumatic.  Thorax & Lungs:  No accessory muscle use Extremities: No clubbing. No cyanosis. No edema.  Skin: Warm. Dry. No erythema. No rash.  Musculoskeletal: Side, R.   Tenderness to palpation: yes- over R obliques, chest wall and R thigh Deformity: no Ecchymosis: no Straight leg test: negative for Poor hamstring flexibility b/l. Neurologic: Normal sensory function. No focal deficits noted. DTR's equal and symmetry in LE's. No clonus. Psychiatric: Normal mood. Age appropriate judgment and insight. Alert & oriented x 3.     Assessment:  BV (bacterial vaginosis) - Plan: metroNIDAZOLE (FLAGYL) 500 MG tablet, Don't drink.  Side pain - Plan: cyclobenzaprine (FLEXERIL) 10 MG tablet, IM depo today. Can do SI jt injection in future if needed.  Plan: Orders as above. Stretches/exercises, heat, ice, Tylenol, NSAIDs. F/u prn. The patient voiced understanding and agreement to the plan.   [place]armen, DO 05/05/19  8:42 AM

## 2019-05-05 NOTE — Patient Instructions (Addendum)
Try to stretch the area.  Ice/cold pack over area for 10-15 min twice daily.  Heat (pad or rice pillow in microwave) over affected area, 10-15 minutes twice daily.   OK to take Tylenol 1000 mg (2 extra strength tabs) or 975 mg (3 regular strength tabs) every 6 hours as needed.  Ibuprofen 400-600 mg (2-3 over the counter strength tabs) every 6 hours as needed for pain.  Take Flexeril (cyclobenzaprine) 1-2 hours before planned bedtime. If it makes you drowsy, do not take during the day. You can try half a tab the following night.  Let us know if you need anything.

## 2019-09-12 ENCOUNTER — Other Ambulatory Visit: Payer: Self-pay | Admitting: Family Medicine

## 2019-09-12 ENCOUNTER — Encounter: Payer: Self-pay | Admitting: Family Medicine

## 2019-09-12 ENCOUNTER — Other Ambulatory Visit: Payer: Self-pay

## 2019-09-12 ENCOUNTER — Ambulatory Visit (INDEPENDENT_AMBULATORY_CARE_PROVIDER_SITE_OTHER): Payer: 59 | Admitting: Family Medicine

## 2019-09-12 VITALS — BP 120/68 | HR 90 | Temp 95.1°F | Ht 62.0 in | Wt 170.2 lb

## 2019-09-12 DIAGNOSIS — Z0000 Encounter for general adult medical examination without abnormal findings: Secondary | ICD-10-CM | POA: Diagnosis not present

## 2019-09-12 DIAGNOSIS — Z23 Encounter for immunization: Secondary | ICD-10-CM

## 2019-09-12 DIAGNOSIS — E7800 Pure hypercholesterolemia, unspecified: Secondary | ICD-10-CM

## 2019-09-12 LAB — COMPREHENSIVE METABOLIC PANEL
ALT: 17 U/L (ref 0–35)
AST: 14 U/L (ref 0–37)
Albumin: 4.6 g/dL (ref 3.5–5.2)
Alkaline Phosphatase: 80 U/L (ref 39–117)
BUN: 12 mg/dL (ref 6–23)
CO2: 29 mEq/L (ref 19–32)
Calcium: 9.8 mg/dL (ref 8.4–10.5)
Chloride: 104 mEq/L (ref 96–112)
Creatinine, Ser: 1.18 mg/dL (ref 0.40–1.20)
GFR: 59.25 mL/min — ABNORMAL LOW (ref >60.00)
Glucose, Bld: 95 mg/dL (ref 70–99)
Potassium: 4.3 mEq/L (ref 3.5–5.1)
Sodium: 140 mEq/L (ref 135–145)
Total Bilirubin: 0.4 mg/dL (ref 0.2–1.2)
Total Protein: 6.7 g/dL (ref 6.0–8.3)

## 2019-09-12 LAB — CBC
HCT: 39.4 % (ref 36.0–46.0)
Hemoglobin: 13 g/dL (ref 12.0–15.0)
MCHC: 32.9 g/dL (ref 30.0–36.0)
MCV: 82.8 fl (ref 78.0–100.0)
Platelets: 269 10*3/uL (ref 150.0–400.0)
RBC: 4.76 Mil/uL (ref 3.87–5.11)
RDW: 13.6 % (ref 11.5–15.5)
WBC: 5.1 10*3/uL (ref 4.0–10.5)

## 2019-09-12 LAB — LIPID PANEL
Cholesterol: 216 mg/dL — ABNORMAL HIGH (ref 0–200)
HDL: 42.8 mg/dL (ref >39.00)
LDL Cholesterol: 155 mg/dL — ABNORMAL HIGH (ref 0–99)
NonHDL: 173.3
Total CHOL/HDL Ratio: 5
Triglycerides: 91 mg/dL (ref 0.0–149.0)
VLDL: 18.2 mg/dL (ref 0.0–40.0)

## 2019-09-12 LAB — VITAMIN D 25 HYDROXY (VIT D DEFICIENCY, FRACTURES): VITD: 51.58 ng/mL (ref 30.00–100.00)

## 2019-09-12 NOTE — Patient Instructions (Addendum)
Give [place] 2-3 business days to get the results of your labs back.   Keep the diet clean and stay active.  Call Dr. Almyra Free for an appt.   Let us know if you need anything.  Knee Exercises It is normal to feel mild stretching, pulling, tightness, or discomfort as you do these exercises, but you should stop right away if you feel sudden pain or your pain gets worse. STRETCHING AND RANGE OF MOTION EXERCISES  These exercises warm up your muscles and joints and improve the movement and flexibility of your knee. These exercises also help to relieve pain, numbness, and tingling. Exercise A: Knee Extension, Prone  1. Lie on your abdomen on a bed. 2. Place your left / right knee just beyond the edge of the surface so your knee is not on the bed. You can put a towel under your left / right thigh just above your knee for comfort. 3. Relax your leg muscles and allow gravity to straighten your knee. You should feel a stretch behind your left / right knee. 4. Hold this position for 30 seconds. 5. Scoot up so your knee is supported between repetitions. Repeat 2 times. Complete this stretch 3 times per week. Exercise B: Knee Flexion, Active    1. Lie on your back with both knees straight. If this causes back discomfort, bend your left / right knee so your foot is flat on the floor. 2. Slowly slide your left / right heel back toward your buttocks until you feel a gentle stretch in the front of your knee or thigh. 3. Hold this position for 30 seconds. 4. Slowly slide your left / right heel back to the starting position. Repeat 2 times. Complete this exercise 3 times per week. Exercise C: Quadriceps, Prone    1. Lie on your abdomen on a firm surface, such as a bed or padded floor. 2. Bend your left / right knee and hold your ankle. If you cannot reach your ankle or pant leg, loop a belt around your foot and grab the belt instead. 3. Gently pull your heel toward your buttocks. Your knee should not slide  out to the side. You should feel a stretch in the front of your thigh and knee. 4. Hold this position for 30 seconds. Repeat 2 times. Complete this stretch 3 times per week. Exercise D: Hamstring, Supine  1. Lie on your back. 2. Loop a belt or towel over the ball of your left / right foot. The ball of your foot is on the walking surface, right under your toes. 3. Straighten your left / right knee and slowly pull on the belt to raise your leg until you feel a gentle stretch behind your knee. ? Do not let your left / right knee bend while you do this. ? Keep your other leg flat on the floor. 4. Hold this position for 30 seconds. Repeat 2 times. Complete this stretch 3 times per week. STRENGTHENING EXERCISES  These exercises build strength and endurance in your knee. Endurance is the ability to use your muscles for a long time, even after they get tired. Exercise E: Quadriceps, Isometric    1. Lie on your back with your left / right leg extended and your other knee bent. Put a rolled towel or small pillow under your knee if told by your health care provider. 2. Slowly tense the muscles in the front of your left / right thigh. You should see your kneecap slide up  toward your hip or see increased dimpling just above the knee. This motion will push the back of the knee toward the floor. 3. For 3 seconds, keep the muscle as tight as you can without increasing your pain. 4. Relax the muscles slowly and completely. Repeat for 10 total reps Repeat 2 ti mes. Complete this exercise 3 times per week. Exercise F: Straight Leg Raises - Quadriceps  1. Lie on your back with your left / right leg extended and your other knee bent. 2. Tense the muscles in the front of your left / right thigh. You should see your kneecap slide up or see increased dimpling just above the knee. Your thigh may even shake a bit. 3. Keep these muscles tight as you raise your leg 4-6 inches (10-15 cm) off the floor. Do not let your  knee bend. 4. Hold this position for 3 seconds. 5. Keep these muscles tense as you lower your leg. 6. Relax your muscles slowly and completely after each repetition. 10 total reps. Repeat 2 times. Complete this exercise 3 times per week.  Exercise G: Hamstring Curls    If told by your health care provider, do this exercise while wearing ankle weights. Begin with 5 lb weights (optional). Then increase the weight by 1 lb (0.5 kg) increments. Do not wear ankle weights that are more than 20 lbs to start with. 1. Lie on your abdomen with your legs straight. 2. Bend your left / right knee as far as you can without feeling pain. Keep your hips flat against the floor. 3. Hold this position for 3 seconds. 4. Slowly lower your leg to the starting position. Repeat for 10 reps.  Repeat 2 times. Complete this exercise 3 times per week. Exercise H: Squats (Quadriceps)  1. Stand in front of a table, with your feet and knees pointing straight ahead. You may rest your hands on the table for balance but not for support. 2. Slowly bend your knees and lower your hips like you are going to sit in a chair. ? Keep your weight over your heels, not over your toes. ? Keep your lower legs upright so they are parallel with the table legs. ? Do not let your hips go lower than your knees. ? Do not bend lower than told by your health care provider. ? If your knee pain increases, do not bend as low. 3. Hold the squat position for 1 second. 4. Slowly push with your legs to return to standing. Do not use your hands to pull yourself to standing. Repeat 2 times. Complete this exercise 3 times per week. Exercise I: Wall Slides (Quadriceps)    1. Lean your back against a smooth wall or door while you walk your feet out 18-24 inches (46-61 cm) from it. 2. Place your feet hip-width apart. 3. Slowly slide down the wall or door until your knees Repeat 2 times. Complete this exercise every other day. 4. Exercise K: Straight  Leg Raises - Hip Abductors  1. Lie on your side with your left / right leg in the top position. Lie so your head, shoulder, knee, and hip line up. You may bend your bottom knee to help you keep your balance. 2. Roll your hips slightly forward so your hips are stacked directly over each other and your left / right knee is facing forward. 3. Leading with your heel, lift your top leg 4-6 inches (10-15 cm). You should feel the muscles in your outer hip lifting. ?  Do not let your foot drift forward. ? Do not let your knee roll toward the ceiling. 4. Hold this position for 3 seconds. 5. Slowly return your leg to the starting position. 6. Let your muscles relax completely after each repetition. 10 total reps. Repeat 2 times. Complete this exercise 3 times per week. Exercise J: Straight Leg Raises - Hip Extensors  1. Lie on your abdomen on a firm surface. You can put a pillow under your hips if that is more comfortable. 2. Tense the muscles in your buttocks and lift your left / right leg about 4-6 inches (10-15 cm). Keep your knee straight as you lift your leg. 3. Hold this position for 3 seconds. 4. Slowly lower your leg to the starting position. 5. Let your leg relax completely after each repetition. Repeat 2 times. Complete this exercise 3 times per week. Document Released: 08/26/2005 Document Revised: 07/06/2016 Document Reviewed: 08/18/2015 Elsevier Interactive Patient Education  2017 [employer].

## 2019-09-12 NOTE — Progress Notes (Signed)
Chief Complaint  Patient presents with  . Annual Exam     Well Woman Megan Gonzalez is here for a complete physical.   Her last physical was >1 year ago.  Current diet: in general, a "healthy" diet. Current exercise: walking. Weight is increasing a little and she denies daytime fatigue. Patient's last menstrual period was 04/24/2012. Seatbelt? Yes  Health Maintenance Tetanus- Yes HIV screening- Yes  Past Medical History:  Diagnosis Date  . Anemia   . Anxiety   . Arthritis    chronic low back pain  . Cancer ([place])    cervical cancer; no more uterus  . Depression   . Fecal incontinence   . Headache(784.0)   . Numbness   . Overactive bladder   . Sleep apnea    CPAP-non compliant  . Tremor      Past Surgical History:  Procedure Laterality Date  . ABDOMINAL HYSTERECTOMY    . APPENDECTOMY  08/25/2017  . bladder neurostimulator    . COLONOSCOPY  1999  . DILATION AND CURETTAGE OF UTERUS    . FOOT SURGERY  2006   left  . INCISION AND DRAINAGE ABSCESS N/A 11/19/2013   Procedure: POSTERIOR CULPOTOMY (INCISION AND DRAINAGE OF POST OPERATIVE CUFF HEMATOMA);  Surgeon: Jonnie Kind, MD;  Location: [facility];  Service: Gynecology;  Laterality: N/A;  . LAPAROSCOPIC APPENDECTOMY N/A 08/26/2017   Procedure: APPENDECTOMY LAPAROSCOPIC;  Surgeon: Jackolyn Confer, MD;  Location: [facility];  Service: General;  Laterality: N/A;  . PUBOVAGINAL SLING  03/24/2012   Procedure: Gaynelle Arabian;  Surgeon: Ailene Rud, MD;  Location: [facility];  Service: Urology;  Laterality: N/A;  lynx with Chemical engineer   . REMOVAL OF URINARY SLING    . SVD     x 3  . TONSILLECTOMY  as child  . URETERAL EXPLORATION  5./30/13  . VAGINAL HYSTERECTOMY N/A 1/2/015   [name], Dr Linus Mako and Dr Leory Plowman, with release of Pubovag sling    Medications  Current Outpatient Medications on File Prior to Visit  Medication Sig Dispense Refill  . albuterol (VENTOLIN HFA) 108 (90 Base) MCG/ACT  inhaler Inhale 2 puffs into the lungs every 4 (four) hours as needed for shortness of breath. 6.7 g 2  . buPROPion (WELLBUTRIN XL) 300 MG 24 hr tablet take 345m by mouth daily  2  . cyclobenzaprine (FLEXERIL) 10 MG tablet Take 0.5-1 tablets (5-10 mg total) by mouth 3 (three) times daily as needed for muscle spasms. 21 tablet 0  . estradiol (VIVELLE-DOT) 0.025 MG/24HR Place 1 patch onto the skin 2 (two) times a week.    . fluticasone (FLONASE) 50 MCG/ACT nasal spray Place 1-2 sprays into both nostrils daily. 1 g 2  . gabapentin (NEURONTIN) 300 MG capsule Take 1 capsule (300 mg total) by mouth 3 (three) times daily. 90 capsule 5  . levocetirizine (XYZAL) 5 MG tablet TAKE 1 TABLET(5 MG) BY MOUTH EVERY EVENING 30 tablet 5  . prazosin (MINIPRESS) 2 MG capsule Take 2 mg by mouth at bedtime.    . rizatriptan (MAXALT-MLT) 5 MG disintegrating tablet Take 1 tablet (5 mg total) by mouth as needed. May repeat in 2 hours if needed 15 tablet 11  . Vitamin D, Ergocalciferol, (DRISDOL) 1.25 MG (50000 UT) CAPS capsule Take 1 capsule (50,000 Units total) by mouth every 7 (seven) days. 12 capsule 0   Allergies Allergies  Allergen Reactions  . Codeine Nausea And Vomiting  . Penicillins Other (See Comments)  Unknown- childhood reaction    Review of Systems: Constitutional:  no unexpected weight changes Eye:  no recent significant change in vision Ear/Nose/Mouth/Throat:  Ears:  no recent change in hearing Nose/Mouth/Throat:  no complaints of nasal congestion, no sore throat Cardiovascular: no chest pain Respiratory:  no shortness of breath Gastrointestinal:  no abdominal pain, no change in bowel habits GU:  Female: negative for dysuria or pelvic pain Musculoskeletal/Extremities:  No new pain of the joints Integumentary (Skin/Breast):  no abnormal skin lesions reported Neurologic:  no headaches Endocrine:  denies fatigue Hematologic/Lymphatic:  No areas of easy bleeding  Exam BP 120/68 (BP Location:  Left Arm, Patient Position: Sitting, Cuff Size: Normal)   Pulse 90   Temp (!) 95.1 F (35.1 C) (Temporal)   Ht _0  (1.575 m)   Wt 170 lb 4 oz (77.2 kg)   LMP 04/24/2012   SpO2 97%   BMI 31.14 kg/m  General:  well developed, well nourished, in no apparent distress Skin:  no significant moles, warts, or growths Head:  no masses, lesions, or tenderness Eyes:  pupils equal and round, sclera anicteric without injection Ears:  canals without lesions, TMs shiny without retraction, no obvious effusion, no erythema Nose:  nares patent, septum midline, mucosa normal, and no drainage or sinus tenderness Throat/Pharynx:  lips and gingiva without lesion; tongue and uvula midline; non-inflamed pharynx; no exudates or postnasal drainage Neck: neck supple without adenopathy, thyromegaly, or masses Lungs:  clear to auscultation, breath sounds equal bilaterally, no respiratory distress Cardio:  regular rate and rhythm, no LE edema Abdomen:  abdomen soft, nontender; bowel sounds normal; no masses or organomegaly Genital: Defer to GYN Musculoskeletal:  symmetrical muscle groups noted without atrophy or deformity Extremities:  no clubbing, cyanosis, or edema, no deformities, no skin discoloration Neuro:  gait antalgic, deep tendon reflexes normal and symmetric Psych: well oriented with normal range of affect and appropriate judgment/insight  Assessment and Plan  Well adult exam - Plan: CBC, Comp Met (CMET), Lipid Profile, Vitamin D (25 hydroxy)  Need for influenza vaccination - Plan: Flu Vaccine QUAD 6+ mos PF IM (Fluarix Quad PF)   Well 48 y.o. female. Counseled on diet and exercise. Shared decision making performed regarding mammograms for breast cancer screening and limitations in younger females. Agrees to wait until she is 51. Ins also was not willing to pay for this.  Other orders as above. Follow up in 6 mo or prn. The patient voiced understanding and agreement to the plan.  [place], DO 09/12/19 8:11 AM

## 2019-09-28 ENCOUNTER — Encounter: Payer: Self-pay | Admitting: Family Medicine

## 2019-09-29 ENCOUNTER — Encounter: Payer: Self-pay | Admitting: *Deleted

## 2019-09-29 ENCOUNTER — Telehealth: Payer: Self-pay | Admitting: *Deleted

## 2019-09-29 NOTE — Telephone Encounter (Signed)
Patient notified form was emailed.

## 2019-09-29 NOTE — Telephone Encounter (Signed)
See other mychart messge

## 2019-09-29 NOTE — Telephone Encounter (Signed)
Copied from [place] [id number]. Topic: General - Inquiry >> Sep 29, 2019  3:20 PM Virl Axe D wrote: Reason for CRM: Pt called to follow up on physical form sent through mychart. Requesting CB from [place] before EOB today.

## 2019-10-10 ENCOUNTER — Other Ambulatory Visit: Payer: 59

## 2020-02-07 ENCOUNTER — Encounter: Payer: Self-pay | Admitting: Family Medicine

## 2020-02-07 ENCOUNTER — Telehealth (INDEPENDENT_AMBULATORY_CARE_PROVIDER_SITE_OTHER): Payer: 59 | Admitting: Family Medicine

## 2020-02-07 ENCOUNTER — Other Ambulatory Visit: Payer: Self-pay

## 2020-02-07 VITALS — Temp 97.5°F

## 2020-02-07 DIAGNOSIS — R519 Headache, unspecified: Secondary | ICD-10-CM | POA: Diagnosis not present

## 2020-02-07 MED ORDER — KETOROLAC TROMETHAMINE 60 MG/2ML IM SOLN
60.0000 mg | Freq: Once | INTRAMUSCULAR | Status: AC
  Administered 2020-02-07: 60 mg via INTRAMUSCULAR

## 2020-02-07 NOTE — Addendum Note (Signed)
Addended by: Sharon Seller B on: 02/07/2020 12:57 PM   Modules accepted: Orders

## 2020-02-07 NOTE — Progress Notes (Signed)
Chief Complaint  Patient presents with  . Headache    hot and cold  . Back Pain  . Nausea  . Fatigue     Megan Gonzalez is a 49 y.o. female here for evaluation of an acute headache. Due to COVID-19 pandemic, we are interacting via web portal for an electronic face-to-face visit. I verified patient's ID using 2 identifiers. Patient agreed to proceed with visit via this method. Patient is at home, I am at office. Patient and I are present for visit.   Duration: 4 days Laterality: bilateral Quality: Aching Severity: 4/10 Associated symptoms: nausea Therapies tried: ibuprofen, Tylenol, Maxalt Hx of migraines: Yes.  Past Medical History:  Diagnosis Date  . Anemia   . Anxiety   . Arthritis    chronic low back pain  . Cancer ([place])    cervical cancer; no more uterus  . Depression   . Fecal incontinence   . Headache(784.0)   . Numbness   . Overactive bladder   . Sleep apnea    CPAP-non compliant  . Tremor     Allergies as of 02/07/2020      Reactions   Codeine Nausea And Vomiting   Penicillins Other (See Comments)   Unknown- childhood reaction      Medication List       Accurate as of February 07, 2020 10:54 AM. If you have any questions, ask your nurse or doctor.        albuterol 108 (90 Base) MCG/ACT inhaler Commonly known as: VENTOLIN HFA Inhale 2 puffs into the lungs every 4 (four) hours as needed for shortness of breath.   buPROPion 300 MG 24 hr tablet Commonly known as: WELLBUTRIN XL take 300mg  by mouth daily   estradiol 0.025 MG/24HR Commonly known as: VIVELLE-DOT Place 1 patch onto the skin 2 (two) times a week.   fluticasone 50 MCG/ACT nasal spray Commonly known as: FLONASE Place 1-2 sprays into both nostrils daily.   gabapentin 300 MG capsule Commonly known as: NEURONTIN Take 1 capsule (300 mg total) by mouth 3 (three) times daily.   levocetirizine 5 MG tablet Commonly known as: XYZAL TAKE 1 TABLET(5 MG) BY MOUTH EVERY EVENING   prazosin 2  MG capsule Commonly known as: MINIPRESS Take 2 mg by mouth at bedtime.   rizatriptan 5 MG disintegrating tablet Commonly known as: Maxalt-MLT Take 1 tablet (5 mg total) by mouth as needed. May repeat in 2 hours if needed       Temp (!) 97.5 F (36.4 C) (Temporal)   LMP 04/24/2012  No conversational dyspnea Age appropriate judgment and insight Nml affect and mood  Acute intractable headache, unspecified headache type  Pt will come in for Toradol injection. No NSAIDs for rest of the day.   F/u prn. The pt voiced understanding and agreement to the plan.  [place], DO 02/07/20 10:54 AM

## 2020-02-22 ENCOUNTER — Ambulatory Visit: Payer: 59 | Admitting: Medical

## 2020-02-22 ENCOUNTER — Ambulatory Visit (HOSPITAL_BASED_OUTPATIENT_CLINIC_OR_DEPARTMENT_OTHER)
Admission: RE | Admit: 2020-02-22 | Discharge: 2020-02-22 | Disposition: A | Discharge status: Home / Self Care | Payer: 59 | Source: Ambulatory Visit | Attending: Medical | Admitting: Medical

## 2020-02-22 ENCOUNTER — Other Ambulatory Visit: Payer: Self-pay

## 2020-02-22 VITALS — BP 120/79 | HR 85 | Temp 97.0°F | Resp 18 | Ht 62.0 in | Wt 159.0 lb

## 2020-02-22 DIAGNOSIS — R1031 Right lower quadrant pain: Secondary | ICD-10-CM | POA: Insufficient documentation

## 2020-02-22 DIAGNOSIS — R944 Abnormal results of kidney function studies: Secondary | ICD-10-CM | POA: Diagnosis not present

## 2020-02-22 DIAGNOSIS — K5900 Constipation, unspecified: Secondary | ICD-10-CM | POA: Insufficient documentation

## 2020-02-22 LAB — CBC WITH DIFFERENTIAL/PLATELET
Basophils Absolute: 0 10*3/uL (ref 0.0–0.1)
Basophils Relative: 0.4 % (ref 0.0–3.0)
Eosinophils Absolute: 0.1 10*3/uL (ref 0.0–0.7)
Eosinophils Relative: 1.5 % (ref 0.0–5.0)
HCT: 39.6 % (ref 36.0–46.0)
Hemoglobin: 12.9 g/dL (ref 12.0–15.0)
Lymphocytes Relative: 46.1 % — ABNORMAL HIGH (ref 12.0–46.0)
Lymphs Abs: 2.7 10*3/uL (ref 0.7–4.0)
MCHC: 32.7 g/dL (ref 30.0–36.0)
MCV: 82.6 fl (ref 78.0–100.0)
Monocytes Absolute: 0.4 10*3/uL (ref 0.1–1.0)
Monocytes Relative: 6.4 % (ref 3.0–12.0)
Neutro Abs: 2.6 10*3/uL (ref 1.4–7.7)
Neutrophils Relative %: 45.6 % (ref 43.0–77.0)
Platelets: 275 10*3/uL (ref 150.0–400.0)
RBC: 4.79 Mil/uL (ref 3.87–5.11)
RDW: 14 % (ref 11.5–15.5)
WBC: 5.8 10*3/uL (ref 4.0–10.5)

## 2020-02-22 LAB — COMPREHENSIVE METABOLIC PANEL
ALT: 17 U/L (ref 0–35)
AST: 17 U/L (ref 0–37)
Albumin: 4.5 g/dL (ref 3.5–5.2)
Alkaline Phosphatase: 90 U/L (ref 39–117)
BUN: 10 mg/dL (ref 6–23)
CO2: 31 mEq/L (ref 19–32)
Calcium: 9.4 mg/dL (ref 8.4–10.5)
Chloride: 104 mEq/L (ref 96–112)
Creatinine, Ser: 1.19 mg/dL (ref 0.40–1.20)
GFR: 58.56 mL/min — ABNORMAL LOW (ref >60.00)
Glucose, Bld: 90 mg/dL (ref 70–99)
Potassium: 4.3 mEq/L (ref 3.5–5.1)
Sodium: 139 mEq/L (ref 135–145)
Total Bilirubin: 0.2 mg/dL (ref 0.2–1.2)
Total Protein: 7 g/dL (ref 6.0–8.3)

## 2020-02-22 LAB — POC URINALSYSI DIPSTICK (AUTOMATED)
Bilirubin, UA: NEGATIVE
Blood, UA: NEGATIVE
Glucose, UA: NEGATIVE
Ketones, UA: NEGATIVE
Leukocytes, UA: NEGATIVE
Nitrite, UA: NEGATIVE
Protein, UA: NEGATIVE
Spec Grav, UA: 1.015 (ref 1.010–1.025)
Urobilinogen, UA: 0.2 E.U./dL
pH, UA: 6 (ref 5.0–8.0)

## 2020-02-22 MED ORDER — HYDROCODONE-ACETAMINOPHEN 5-325 MG PO TABS: 1.0000 | ORAL_TABLET | Freq: Four times a day (QID) | ORAL | 0 refills | Status: DC | PRN

## 2020-02-22 NOTE — Progress Notes (Signed)
Subjective:    Patient ID: Megan Gonzalez, female    DOB: [birth year], 49 y.o.   MRN: [id number]  HPI  Pt in with pain in rt side abdomen area for 6 days. She points to rt side lower quadrant. Pt states she has seen pcp for this and no cause found. Pt is going to see specialist at [facility] for this in May. She is seeing urogynecologist for this. Pt had pain like this pain on and off since 2011 on and off.   Pt states pain over past 6 days along the lines as prior pain.  Pt does have some pain urinating some. When her bladder gets full she will often get pain.  In 2018 pt had CT abdomen pelvis.   IMPRESSION: 1. Likely acute appendicitis only involving the appendiceal tip, with the remainder of the appendix normal in appearance. No evidence of perforation or abscess. 2. Focal wall thickening involving the gastric antrum, query gastritis. 3. No acute abnormalities elsewhere involving the abdomen or pelvis. Moderate colonic stool burden. 4. Descending and sigmoid colon diverticulosis without evidence of acute diverticulitis.  Pt in 2018 had her appendix removed.  Also at that time had hernia repaired.  Pt had hysterectomy. Still has ovaries. She had reconstructive bladder surgery.  Pt had colonscopy last year. She states study was good except some divertulosis per pt report. But I can't find that report from colonoscopy. But above descending and sigmoid.  Pt last bm 2 days ago. Hx of constipation.  Has hx of decreased gfr so nsaids not good idea. She is on gabapentin. On ibuprofen but advisng to stop.  Pt had appendectomy, hysterectomy and bladder reconstruction.        Review of Systems  Constitutional: Negative for chills, fatigue and fever.  Respiratory: Negative for cough, chest tightness, shortness of breath and wheezing.   Cardiovascular: Negative for chest pain and palpitations.  Gastrointestinal: Positive for abdominal pain and constipation. Negative for abdominal  distention, blood in stool, diarrhea, nausea, rectal pain and vomiting.  Musculoskeletal: Negative for back pain.  Skin: Negative for rash.  Neurological: Negative for dizziness and headaches.  Hematological: Negative for adenopathy.  Psychiatric/Behavioral: Negative for behavioral problems and confusion.    Past Medical History:  Diagnosis Date  . Anemia   . Anxiety   . Arthritis    chronic low back pain  . Cancer ([place])    cervical cancer; no more uterus  . Depression   . Fecal incontinence   . Headache(784.0)   . Numbness   . Overactive bladder   . Sleep apnea    CPAP-non compliant  . Tremor      Social History   Socioeconomic History  . Marital status: Married    Spouse name: Not on file  . Number of children: 3  . Years of education: Masters  . Highest education level: Not on file  Occupational History  . Occupation: Tutoring part-time  Tobacco Use  . Smoking status: Never Smoker  . Smokeless tobacco: Never Used  Substance and Sexual Activity  . Alcohol use: No  . Drug use: No  . Sexual activity: Not on file  Other Topics Concern  . Not on file  Social History Narrative   Lives at home with husband and children.   Right-handed.   2 cups caffeine per day.   Social Determinants of Health   Financial Resource Strain:   . Difficulty of Paying Living Expenses:   Food Insecurity:   . Worried  About Running Out of Food in the Last Year:   . [place] in the Last Year:   Transportation Needs:   . Lack of Transportation (Medical):   Marland Kitchen Lack of Transportation (Non-Medical):   Physical Activity:   . Days of Exercise per Week:   . Minutes of Exercise per Session:   Stress:   . Feeling of Stress :   Social Connections:   . Frequency of Communication with Friends and Family:   . Frequency of Social Gatherings with Friends and Family:   . Attends Religious Services:   . Active Member of Clubs or Organizations:   . Attends Archivist Meetings:    Marland Kitchen Marital Status:   Intimate Partner Violence:   . Fear of Current or Ex-Partner:   . Emotionally Abused:   Marland Kitchen Physically Abused:   . Sexually Abused:     Past Surgical History:  Procedure Laterality Date  . ABDOMINAL HYSTERECTOMY    . APPENDECTOMY  08/25/2017  . bladder neurostimulator    . COLONOSCOPY  1999  . DILATION AND CURETTAGE OF UTERUS    . FOOT SURGERY  2006   left  . INCISION AND DRAINAGE ABSCESS N/A 11/19/2013   Procedure: POSTERIOR CULPOTOMY (INCISION AND DRAINAGE OF POST OPERATIVE CUFF HEMATOMA);  Surgeon: Jonnie Kind, MD;  Location: [facility];  Service: Gynecology;  Laterality: N/A;  . LAPAROSCOPIC APPENDECTOMY N/A 08/26/2017   Procedure: APPENDECTOMY LAPAROSCOPIC;  Surgeon: Jackolyn Confer, MD;  Location: [facility];  Service: General;  Laterality: N/A;  . PUBOVAGINAL SLING  03/24/2012   Procedure: Gaynelle Arabian;  Surgeon: Ailene Rud, MD;  Location: [facility];  Service: Urology;  Laterality: N/A;  lynx with Chemical engineer   . REMOVAL OF URINARY SLING    . SVD     x 3  . TONSILLECTOMY  as child  . URETERAL EXPLORATION  5./30/13  . VAGINAL HYSTERECTOMY N/A 1/2/015   [name], Dr Linus Mako and Dr Leory Plowman, with release of Pubovag sling    Family History  Problem Relation Age of Onset  . Cancer Mother        Breast cancer  . Thyroid disease Mother   . Hypotension Mother   . Other Father        unsure of history  . Anesthesia problems Neg Hx     Allergies  Allergen Reactions  . Codeine Nausea And Vomiting  . Penicillins Other (See Comments)    Unknown- childhood reaction    Current Outpatient Medications on File Prior to Visit  Medication Sig Dispense Refill  . albuterol (VENTOLIN HFA) 108 (90 Base) MCG/ACT inhaler Inhale 2 puffs into the lungs every 4 (four) hours as needed for shortness of breath. 6.7 g 2  . buPROPion (WELLBUTRIN XL) 300 MG 24 hr tablet take 300mg  by mouth daily  2  . estradiol (VIVELLE-DOT) 0.025 MG/24HR Place 1 patch  onto the skin 2 (two) times a week.    . fluticasone (FLONASE) 50 MCG/ACT nasal spray Place 1-2 sprays into both nostrils daily. 1 g 2  . gabapentin (NEURONTIN) 300 MG capsule Take 1 capsule (300 mg total) by mouth 3 (three) times daily. 90 capsule 5  . levocetirizine (XYZAL) 5 MG tablet TAKE 1 TABLET(5 MG) BY MOUTH EVERY EVENING 30 tablet 5  . prazosin (MINIPRESS) 2 MG capsule Take 2 mg by mouth at bedtime.    . rizatriptan (MAXALT-MLT) 5 MG disintegrating tablet Take 1 tablet (5 mg total) by mouth as  needed. May repeat in 2 hours if needed 15 tablet 11   No current facility-administered medications on file prior to visit.    BP 120/79 (BP Location: Left Arm, Patient Position: Sitting, Cuff Size: Large)   Pulse 85   Temp (!) 97 F (36.1 C) (Temporal)   Resp 18   Ht 5\' 2"  (1.575 m)   Wt 159 lb (72.1 kg)   LMP 04/24/2012   SpO2 100%   BMI 29.08 kg/m       Objective:   Physical Exam  General- No acute distress. Pleasant patient. Neck- Full range of motion, no jvd Lungs- Clear, even and unlabored. Heart- regular rate and rhythm. Neurologic- CNII- XII grossly intact.  Abdomen-soft, nondistended, positive bowel sounds.  No rebound or guarding but does have right lower quadrant tenderness to palpation. Back-no CVA tenderness.      Assessment & Plan:  You have chronic recurrent right lower quadrant region pain for years.  Work-up so far has been negative.  Most recent work-up done by your gynecologist and found no definitive cause.  You have had appendectomy in the past and your urine does not show any blood today.   Recent 2 days mild constipation so we will get 1 view abdomen x-ray, CBC and metabolic panel.  On review of prior CT abdomen pelvis no diverticulosis on the right side.  We will go ahead and include urine culture study and work-up today.  Since pain has been present for numerous years no cause found adhesions is a possible cause.  Presently he can continue the  gabapentin and will provide limited prescription of 8 tablets Norco.  Follow-up with your gynecologist as already scheduled.  Notify us if your signs and symptoms change or worsen.  In that event would consider getting repeat CT.  Any severe signs symptoms recommend ED evaluation.  Follow-up in 7 to 10 days with PCP or as needed.  Time spent with patient today was 45 minutes which consisted of chart review, discussing differential diagnosis, work up, treatment and documentation.

## 2020-02-22 NOTE — Patient Instructions (Signed)
You have chronic recurrent right lower quadrant region pain for years.  Work-up so far has been negative.  Most recent work-up done by your gynecologist and found no definitive cause.  You have had appendectomy in the past and your urine does not show any blood today.   Recent 2 days mild constipation so we will get 1 view abdomen x-ray, CBC and metabolic panel.  On review of prior CT abdomen pelvis no diverticulosis on the right side.  We will go ahead and include urine culture study and work-up today.  Since pain has been present for numerous years no cause found adhesions is a possible cause.  Presently he can continue the gabapentin and will provide limited prescription of 8 tablets Norco.  Follow-up with your gynecologist as already scheduled.  Notify us if your signs and symptoms change or worsen.  In that event would consider getting repeat CT.  Any severe signs symptoms recommend ED evaluation.  Follow-up in 7 to 10 days with PCP or as needed.

## 2020-02-23 ENCOUNTER — Encounter: Payer: Self-pay | Admitting: Medical

## 2020-02-23 LAB — URINE CULTURE
MICRO NUMBER:: 10420754
Result:: NO GROWTH
SPECIMEN QUALITY:: ADEQUATE

## 2020-03-11 ENCOUNTER — Ambulatory Visit: Payer: 59 | Admitting: Family Medicine

## 2020-03-20 ENCOUNTER — Ambulatory Visit: Payer: 59 | Admitting: Family Medicine

## 2020-04-03 ENCOUNTER — Ambulatory Visit (INDEPENDENT_AMBULATORY_CARE_PROVIDER_SITE_OTHER): Payer: 59 | Admitting: Family Medicine

## 2020-04-03 ENCOUNTER — Encounter: Payer: Self-pay | Admitting: Family Medicine

## 2020-04-03 ENCOUNTER — Other Ambulatory Visit: Payer: Self-pay

## 2020-04-03 VITALS — BP 122/80 | HR 80 | Temp 97.0°F | Ht 62.0 in | Wt 168.2 lb

## 2020-04-03 DIAGNOSIS — IMO0002 Reserved for concepts with insufficient information to code with codable children: Secondary | ICD-10-CM

## 2020-04-03 DIAGNOSIS — M461 Sacroiliitis, not elsewhere classified: Secondary | ICD-10-CM

## 2020-04-03 DIAGNOSIS — J302 Other seasonal allergic rhinitis: Secondary | ICD-10-CM | POA: Diagnosis not present

## 2020-04-03 DIAGNOSIS — G43709 Chronic migraine without aura, not intractable, without status migrainosus: Secondary | ICD-10-CM

## 2020-04-03 DIAGNOSIS — E7800 Pure hypercholesterolemia, unspecified: Secondary | ICD-10-CM

## 2020-04-03 LAB — LIPID PANEL
Cholesterol: 194 mg/dL (ref 0–200)
HDL: 37.8 mg/dL — ABNORMAL LOW (ref >39.00)
LDL Cholesterol: 136 mg/dL — ABNORMAL HIGH (ref 0–99)
NonHDL: 156.1
Total CHOL/HDL Ratio: 5
Triglycerides: 99 mg/dL (ref 0.0–149.0)
VLDL: 19.8 mg/dL (ref 0.0–40.0)

## 2020-04-03 MED ORDER — FLUTICASONE PROPIONATE 50 MCG/ACT NA SUSP: 2.0000 | Freq: Every day | NASAL | 2 refills | Status: DC

## 2020-04-03 MED ORDER — RIZATRIPTAN BENZOATE 5 MG PO TBDP: 5.0000 mg | ORAL_TABLET | ORAL | 11 refills | Status: DC | PRN

## 2020-04-03 MED ORDER — AZELASTINE HCL 0.1 % NA SOLN: 2.0000 | Freq: Two times a day (BID) | NASAL | 12 refills | Status: DC

## 2020-04-03 MED ORDER — GABAPENTIN 300 MG PO CAPS: 300.0000 mg | ORAL_CAPSULE | Freq: Three times a day (TID) | ORAL | 2 refills | Status: DC

## 2020-04-03 NOTE — Patient Instructions (Addendum)
If you do not hear anything about your referral in the next 1-2 weeks, call our office and ask for an update.  Keep the diet clean and stay active.  Continue Claritin and Flonase. A new nasal spray has been added.  If you do not hear anything about your referral in the next 1-2 weeks, call our office and ask for an update.  Let us know if you need anything.

## 2020-04-03 NOTE — Progress Notes (Signed)
Chief Complaint  Patient presents with  . Follow-up    Subjective: Patient is a 49 y.o. female here for f/u.  Hx of allergies. Was on Xyzal but did not feel it was working. Currently using Claritin and INCS.  She reports compliance and denies any adverse effects.  Symptoms include itchy/watery eyes, stuffy nose, ear fullness.  Pollen is a main trigger for her allergies.  She has a history of chronic SI joint pain, mainly on the right.  Injections of helped in the past but not so much recently.  She is referred to the sports medicine team but due to Covid did not end up seeing them.  She still having considerable pain.  Patient has a history of migraines.  She takes Maxalt as needed.  This works quite well for her.  She is content with the frequency of her headaches.  Past Medical History:  Diagnosis Date  . Anemia   . Anxiety   . Arthritis    chronic low back pain  . Cancer ([place])    cervical cancer; no more uterus  . Depression   . Fecal incontinence   . Headache(784.0)   . Numbness   . Overactive bladder   . Sleep apnea    CPAP-non compliant  . Tremor     Objective: BP 122/80 (BP Location: Left Arm, Patient Position: Sitting, Cuff Size: Normal)   Pulse 80   Temp (!) 97 F (36.1 C) (Temporal)   Ht 5\' 2"  (1.575 m)   Wt 168 lb 4 oz (76.3 kg)   LMP 04/24/2012   SpO2 97%   BMI 30.77 kg/m  General: Awake, appears stated age HEENT: MMM, EOMi Heart: RRR, no lower extremity edema Lungs: CTAB, no rales, wheezes or rhonchi. No accessory muscle use Neuro: DTR's equal and symmetric in LE's, antalgic gait MSK: +TTP over b/l SI jt Psych: Age appropriate judgment and insight, normal affect and mood  Assessment and Plan: Seasonal allergies - Plan: azelastine (ASTELIN) 0.1 % nasal spray, fluticasone (FLONASE) 50 MCG/ACT nasal spray  Sacroiliitis ([facility]) - Plan: gabapentin (NEURONTIN) 300 MG capsule, Ambulatory referral to Physical Medicine Rehab  Chronic migraine - Plan:  rizatriptan (MAXALT-MLT) 5 MG disintegrating tablet  Pure hypercholesterolemia - Plan: Lipid panel  1- Add Astelin. Cont INCS and Claritin. F/u in 1 mo if no improvement. Will add Singulair. 2- Cont Gabapentin. Refer PM&R 3- Cont Maxalt prn. HA's currently well controlled. 4- Ck above. May need to start statin.  F/u in 6 mo for CPE otherwise or prn.  The patient voiced understanding and agreement to the plan.  [place], DO 04/03/20  12:14 PM

## 2020-04-17 ENCOUNTER — Encounter: Payer: Self-pay | Admitting: Physical Therapy

## 2020-04-17 ENCOUNTER — Other Ambulatory Visit: Payer: Self-pay

## 2020-04-17 ENCOUNTER — Ambulatory Visit: Payer: 59 | Attending: Family Medicine | Admitting: Physical Therapy

## 2020-04-17 DIAGNOSIS — R262 Difficulty in walking, not elsewhere classified: Secondary | ICD-10-CM | POA: Diagnosis present

## 2020-04-17 DIAGNOSIS — R2681 Unsteadiness on feet: Secondary | ICD-10-CM | POA: Diagnosis present

## 2020-04-17 DIAGNOSIS — M6281 Muscle weakness (generalized): Secondary | ICD-10-CM | POA: Diagnosis present

## 2020-04-17 DIAGNOSIS — R29898 Other symptoms and signs involving the musculoskeletal system: Secondary | ICD-10-CM | POA: Diagnosis present

## 2020-04-17 NOTE — Therapy (Signed)
[facility] [address]  [place], [place], [address] Phone: [phone]   Fax:  [phone]  Physical Therapy Evaluation  Patient Details  Name: Megan Gonzalez MRN: [id number] Date of Birth: [birth year] Referring Provider (PT): Riki Sheer, DO   Encounter Date: 04/17/2020   PT End of Session - 04/17/20 1035    Visit Number 1    Number of Visits 17    Date for PT Re-Evaluation 06/12/20    Authorization Type UHC & Medicare    PT Start Time 0805    PT Stop Time 0848    PT Time Calculation (min) 43 min    Activity Tolerance Patient tolerated treatment well;Patient limited by pain    Behavior During Therapy [facility] for tasks assessed/performed           Past Medical History:  Diagnosis Date  . Anemia   . Anxiety   . Arthritis    chronic low back pain  . Cancer ([place])    cervical cancer; no more uterus  . Depression   . Fecal incontinence   . Headache(784.0)   . Numbness   . Overactive bladder   . Sleep apnea    CPAP-non compliant  . Tremor     Past Surgical History:  Procedure Laterality Date  . ABDOMINAL HYSTERECTOMY    . APPENDECTOMY  08/25/2017  . bladder neurostimulator    . COLONOSCOPY  1999  . DILATION AND CURETTAGE OF UTERUS    . FOOT SURGERY  2006   left  . INCISION AND DRAINAGE ABSCESS N/A 11/19/2013   Procedure: POSTERIOR CULPOTOMY (INCISION AND DRAINAGE OF POST OPERATIVE CUFF HEMATOMA);  Surgeon: Jonnie Kind, MD;  Location: [facility];  Service: Gynecology;  Laterality: N/A;  . LAPAROSCOPIC APPENDECTOMY N/A 08/26/2017   Procedure: APPENDECTOMY LAPAROSCOPIC;  Surgeon: Jackolyn Confer, MD;  Location: [facility];  Service: General;  Laterality: N/A;  . PUBOVAGINAL SLING  03/24/2012   Procedure: Gaynelle Arabian;  Surgeon: Ailene Rud, MD;  Location: [facility];  Service: Urology;  Laterality: N/A;  lynx with Chemical engineer   . REMOVAL OF URINARY SLING    . SVD     x 3  .  TONSILLECTOMY  as child  . URETERAL EXPLORATION  5./30/13  . VAGINAL HYSTERECTOMY N/A 1/2/015   [employer], Dr Linus Mako and Dr Leory Plowman, with release of Pubovag sling    There were no vitals filed for this visit.    Subjective Assessment - 04/17/20 0807    Subjective Patient reports struggling with pelvic pain since giving birth to her son in 2011. Reports that this was a traumatic delivery, which resulted in a "pelvic bone separation SI joint sprain, and neurogenic bladder." Since then, she has gotten a bladder neurostimulator, but still dealing with fecal incontinence. Pain occurs over R anterior groin with radiation to the lateral hip and buttock. Denies N/T or new B&B changes. Feels that her pain is spontaneous and not related to certain movements/positions-sometimes she wakes from the pain. Unable to tolerate supine positioning d/t pain. Does get benefit from wearing [employer] slides d/t them having a hard "therapeutic" sole and also notes that SI injections have helped. Has been using SPC intermittently- most often since her last fall in January when she spontaneously fell. Had chemotherapy for cervical CA and this is in remission.    Pertinent History tremor, HA, depression, cervical CA, aniety, anemia, bladder neurostimulator, L foot surgery 2006    Limitations Sitting;Standing;Walking;House  hold activities;Lifting    How long can you sit comfortably? 30-40 min using cushion    How long can you stand comfortably? 10-20 min    How long can you walk comfortably? 5 minutes    Diagnostic tests none recent    Patient Stated Goals improve pain    Currently in Pain? Yes    Pain Score 0-No pain    Pain Location Hip    Pain Orientation Right;Anterior;Posterior;Lateral    Pain Descriptors / Indicators Tightness    Pain Type Chronic pain              [facility] PT Assessment - 04/17/20 0819      Assessment   Medical Diagnosis Sacroiliitis    Referring Provider (PT) Riki Sheer, DO     Onset Date/Surgical Date --   chronic, since 2011   Next MD Visit 10/04/20    Prior Therapy yes- pelvic health PT      Precautions   Precautions --   bladder stimulator- no US/e-stim     Balance Screen   Has the patient fallen in the past 6 months Yes    How many times? 2   while walking and getting up from chair   Has the patient had a decrease in activity level because of a fear of falling?  Yes    Is the patient reluctant to leave their home because of a fear of falling?  No      Home Social worker Private residence    Living Arrangements Spouse/significant other;Children    Available Help at Discharge Family    Type of [place] to enter    Entrance Stairs-Number of Steps 3    [place] One level    [place] - single point;Shower seat      Prior Function   Level of Independence Independent    Vocation On disability    Leisure walking      Cognition   Overall Cognitive Status Within Functional Limits for tasks assessed      Sensation   Light Touch Appears Intact      Coordination   Gross Motor Movements are Fluid and Coordinated Yes      Posture/Postural Control   Posture/Postural Control Postural limitations    Postural Limitations Rounded Shoulders    Posture Comments pt seated on cushion from home throughout session for comfort      ROM / Strength   AROM / PROM / Strength Strength      Strength   Strength Assessment Site Hip;Knee;Ankle    Right/Left Hip Right;Left    Right Hip Flexion 4/5    Right Hip Extension 4/5    Right Hip External Rotation  4/5    Right Hip Internal Rotation 4+/5    Right Hip ABduction 4-/5    Right Hip ADduction 4-/5    Left Hip Flexion 4/5    Left Hip Extension 4/5    Left Hip External Rotation 4+/5    Left Hip Internal Rotation 4/5    Left Hip ABduction 4-/5    Left Hip ADduction 4-/5    Right/Left Knee Right;Left    Right Knee  Flexion 4/5    Right Knee Extension 4+/5    Left Knee Flexion 4-/5    Left Knee Extension 4+/5    Right/Left Ankle Right;Left    Right Ankle Dorsiflexion 4+/5  Right Ankle Plantar Flexion 4-/5    Left Ankle Dorsiflexion 4+/5    Left Ankle Plantar Flexion 4-/5      Flexibility   Soft Tissue Assessment /Muscle Length yes    Piriformis moderately tight on R      Palpation   SI assessment  ASIS and PSIS symmetrical    Palpation comment TTP to R ASIS and PSIS, increased tone over R posterior chain; and TTP in R glutes and piriformis, and lumbar paraspinals      Ambulation/Gait   Assistive device Straight cane    Gait Pattern Step-to pattern;Step-through pattern;Trunk flexed;Lateral hip instability;Decreased weight shift to right;Decreased stance time - right    Ambulation Surface Level;Indoor    Gait velocity decreased                      Objective measurements completed on examination: See above findings.               PT Education - 04/17/20 1035    Education Details prognosis, POC, HEP    Person(s) Educated Patient    Methods Explanation;Demonstration;Tactile cues;Verbal cues;Handout    Comprehension Verbalized understanding;Returned demonstration            PT Short Term Goals - 04/17/20 1045      PT SHORT TERM GOAL #1   Title Patient to be independent with initial HEP.    Time 3    Period Weeks    Status New    Target Date 05/08/20             PT Long Term Goals - 04/17/20 1045      PT LONG TERM GOAL #1   Title Patient to be independent with advanced HEP.    Time 8    Period Weeks    Status New    Target Date 06/12/20      PT LONG TERM GOAL #2   Title Patient to demonstrate B LE strength >/=4+/5.    Time 8    Period Weeks    Status New    Target Date 06/12/20      PT LONG TERM GOAL #3   Title Patient to demonstrate lumbar AROM WFL and without pain limiting.    Time 8    Period Weeks    Status New    Target Date  06/12/20      PT LONG TERM GOAL #4   Title Patient to report tolerance for 1 hour of upright sitting without use of additional cushion without pain limiting.    Time 8    Period Weeks    Status New    Target Date 06/12/20      PT LONG TERM GOAL #5   Title Patient to score >45/56 on Berg to decrease risk of falls.    Time 8    Period Weeks    Status New      Additional Long Term Goals   Additional Long Term Goals Yes      PT LONG TERM GOAL #6   Title Patient to score <12 sec on 5xSTS without use of UEs in order to decrease risk of falls.    Time 8    Period Weeks    Status New    Target Date 06/12/20                  Plan - 04/17/20 1036    Clinical Impression Statement Patient is a 49y/o F, PMH  significant for cervical CA in remission, presenting to OPPT with c/o chronic R sided anterior hip and SIJ pain since experiencing a traumatic delivery of her son in 2011. Patient now has a bladder stimulator, but still struggles with fecal incontinence at baseline. Also notes that she uses a SPC d/t having 2 falls in the past 6 months. Pain occurs over R anterior groin with radiation to the lateral hip and buttock. Denies N/T or new B&B changes. Feels that her pain is spontaneous, as she sometimes wakes from the pain. Also notes poor tolerance for supine positioning d/t pain. Patient today presenting with rounded shoulders, B LE weakness, increased tone throughout R posterior chain, tenderness to palpation over R ASIS and PSIS, glutes, piriformis, and lumbar paraspinals, imbalance with transfers, and gait deviations. Lumbar AROM and balance not assessed d/t time, thus plan to assed this next session. Patient with poor supine positional tolerance, thus educated patient on gentle stretching and strengthening HEP done in sitting- patient reported understanding. Would benefit from skilled PT services 2x/week for 8 weeks to address aforementioned impairments.    Personal Factors and  Comorbidities Age;Sex;Comorbidity 3+;Fitness;Past/Current Experience;Time since onset of injury/illness/exacerbation    Comorbidities tremor, HA, depression, cervical CA, aniety, anemia, bladder neurostimulator, L foot surgery 2006    Examination-Activity Limitations Sit;Sleep;Bed Mobility;Bend;Squat;Stairs;Carry;Stand;Continence;Dressing;Toileting;Transfers;Hygiene/Grooming;Lift;Locomotion Level;Reach Overhead    Examination-Participation Restrictions Church;Cleaning;Shop;Community Activity;Driving;Interpersonal Relationship;Laundry;Meal Prep    Stability/Clinical Decision Making Stable/Uncomplicated    Clinical Decision Making Low    Rehab Potential Good    PT Frequency 2x / week    PT Duration 8 weeks    PT Treatment/Interventions ADLs/Self Care Home Management;Cryotherapy;Moist Heat;Balance training;Therapeutic exercise;Therapeutic activities;Functional mobility training;Stair training;Gait training;DME Instruction;Neuromuscular re-education;Patient/family education;Manual techniques;Taping;Energy conservation;Dry needling;Passive range of motion    PT Next Visit Plan lumbar/sacrum FOTO, Berg, 5xSTS, assess lumbar AROM, reassess HEP    Consulted and Agree with Plan of Care Patient           Patient will benefit from skilled therapeutic intervention in order to improve the following deficits and impairments:  Abnormal gait, Hypomobility, Decreased activity tolerance, Decreased strength, Increased fascial restricitons, Pain, Decreased balance, Difficulty walking, Increased muscle spasms, Improper body mechanics, Decreased range of motion, Impaired flexibility, Postural dysfunction  Visit Diagnosis: Obstetric damage to pelvic joints and ligaments  Muscle weakness (generalized)  Difficulty in walking, not elsewhere classified  Other symptoms and signs involving the musculoskeletal system  Unsteadiness on feet     Problem List Patient Active Problem List   Diagnosis Date Noted  .  Seasonal allergies 03/10/2019  . Vitamin D deficiency 03/10/2019  . CKD (chronic kidney disease) stage 2, GFR 60-89 ml/min 09/08/2018  . Sacroiliitis ([place]) 05/09/2018  . Edema 05/09/2018  . Abdominal pain 08/25/2017  . Chronic migraine 07/26/2017  . Chronic low back pain without sciatica 07/26/2017  . Gait abnormality 07/26/2017  . Pain of left calf 10/25/2014  . History of treatment by mental health professional 10/02/2014  . Atypical chest pain 08/09/2014  . Routine general medical examination at a health care facility 08/09/2014  . Constipation, postoperative 11/19/2013  . Pelvic pain in female 11/17/2013  . Back pain 02/03/2012    Janene Harvey, PT, DPT 04/17/20 10:50 AM   [facility] [address]  [place], [place], [address] Phone: [phone]   Fax:  [phone]  Name: Chaye Misch MRN: [id number] Date of Birth: 05-03-1971

## 2020-04-23 ENCOUNTER — Ambulatory Visit: Payer: 59 | Admitting: Physical Therapy

## 2020-04-23 ENCOUNTER — Encounter: Payer: Self-pay | Admitting: Physical Therapy

## 2020-04-23 ENCOUNTER — Other Ambulatory Visit: Payer: Self-pay

## 2020-04-23 DIAGNOSIS — R29898 Other symptoms and signs involving the musculoskeletal system: Secondary | ICD-10-CM

## 2020-04-23 DIAGNOSIS — R2681 Unsteadiness on feet: Secondary | ICD-10-CM

## 2020-04-23 DIAGNOSIS — R262 Difficulty in walking, not elsewhere classified: Secondary | ICD-10-CM

## 2020-04-23 DIAGNOSIS — M6281 Muscle weakness (generalized): Secondary | ICD-10-CM

## 2020-04-23 NOTE — Therapy (Signed)
[facility] [address]  [place], [place], [address] Phone: [phone]   Fax:  [phone]  Physical Therapy Treatment  Patient Details  Name: Megan Gonzalez MRN: [id number] Date of Birth: 07/26/1971 Referring Provider (PT): Riki Sheer, DO   Encounter Date: 04/23/2020   PT End of Session - 04/23/20 1406    Visit Number 2    Number of Visits 17    Date for PT Re-Evaluation 06/12/20    Authorization Type UHC & Medicare    PT Start Time 4970    PT Stop Time 1453   heat at end   PT Time Calculation (min) 46 min    Activity Tolerance Patient tolerated treatment well;Patient limited by pain    Behavior During Therapy [facility] for tasks assessed/performed           Past Medical History:  Diagnosis Date  . Anemia   . Anxiety   . Arthritis    chronic low back pain  . Cancer ([place])    cervical cancer; no more uterus  . Depression   . Fecal incontinence   . Headache(784.0)   . Numbness   . Overactive bladder   . Sleep apnea    CPAP-non compliant  . Tremor     Past Surgical History:  Procedure Laterality Date  . ABDOMINAL HYSTERECTOMY    . APPENDECTOMY  08/25/2017  . bladder neurostimulator    . COLONOSCOPY  1999  . DILATION AND CURETTAGE OF UTERUS    . FOOT SURGERY  2006   left  . INCISION AND DRAINAGE ABSCESS N/A 11/19/2013   Procedure: POSTERIOR CULPOTOMY (INCISION AND DRAINAGE OF POST OPERATIVE CUFF HEMATOMA);  Surgeon: Jonnie Kind, MD;  Location: [facility];  Service: Gynecology;  Laterality: N/A;  . LAPAROSCOPIC APPENDECTOMY N/A 08/26/2017   Procedure: APPENDECTOMY LAPAROSCOPIC;  Surgeon: Jackolyn Confer, MD;  Location: [facility];  Service: General;  Laterality: N/A;  . PUBOVAGINAL SLING  03/24/2012   Procedure: Gaynelle Arabian;  Surgeon: Ailene Rud, MD;  Location: [facility];  Service: Urology;  Laterality: N/A;  lynx with Chemical engineer   . REMOVAL OF URINARY SLING    . SVD     x  3  . TONSILLECTOMY  as child  . URETERAL EXPLORATION  5./30/13  . VAGINAL HYSTERECTOMY N/A 1/2/015   [employer], Dr Linus Mako and Dr Leory Plowman, with release of Pubovag sling    There were no vitals filed for this visit.   Subjective Assessment - 04/23/20 1408    Subjective pt reports she is a little sore from her exercise, trying to wean off ibuprofen - been a month since needed.    Patient Stated Goals improve pain    Currently in Pain? Yes    Pain Score 5     Pain Location Sacrum    Pain Orientation Right    Pain Descriptors / Indicators Tightness    Pain Type Chronic pain                             [facility] Adult PT Treatment/Exercise - 04/23/20 0001      Exercises   Exercises Lumbar      Lumbar Exercises: Stretches   Figure 4 Stretch With overpressure;Seated;60 seconds      Lumbar Exercises: Seated   Other Seated Lumbar Exercises Rt hip IR to work into gluts      Lumbar Exercises: Prone  Straight Leg Raise 10 reps    Straight Leg Raises Limitations with pelvic press     Other Prone Lumbar Exercises POE with TA contractions , pelvic press with VC for form      Modalities   Modalities Moist Heat      Moist Heat Therapy   Number Minutes Moist Heat 12 Minutes    Moist Heat Location --   gluts in prone     Manual Therapy   Manual Therapy Soft tissue mobilization;Myofascial release    Manual therapy comments skilled palpation and monitoring during dry needling    Soft tissue mobilization STM to Rt buttocks and lumbar paraspinals, TPR to piriformins    Myofascial Release SIJ ligament release            Trigger Point Dry Needling - 04/23/20 0001    Consent Given? Yes    Education Handout Provided Yes    Muscles Treated Back/Hip Gluteus medius;Gluteus minimus;Gluteus maximus;Piriformis   all rt side   Gluteus Minimus Response Palpable increased muscle length;Twitch response elicited    Gluteus Medius Response Palpable increased muscle  length;Twitch response elicited    Gluteus Maximus Response Palpable increased muscle length;Twitch response elicited    Piriformis Response Palpable increased muscle length                PT Education - 04/23/20 1442    Education Details DN, and self trigger point release to gluts on wall    Person(s) Educated Patient    Methods Explanation;Demonstration;Handout    Comprehension Returned demonstration;Verbalized understanding            PT Short Term Goals - 04/17/20 1045      PT SHORT TERM GOAL #1   Title Patient to be independent with initial HEP.    Time 3    Period Weeks    Status New    Target Date 05/08/20             PT Long Term Goals - 04/17/20 1045      PT LONG TERM GOAL #1   Title Patient to be independent with advanced HEP.    Time 8    Period Weeks    Status New    Target Date 06/12/20      PT LONG TERM GOAL #2   Title Patient to demonstrate B LE strength >/=4+/5.    Time 8    Period Weeks    Status New    Target Date 06/12/20      PT LONG TERM GOAL #3   Title Patient to demonstrate lumbar AROM WFL and without pain limiting.    Time 8    Period Weeks    Status New    Target Date 06/12/20      PT LONG TERM GOAL #4   Title Patient to report tolerance for 1 hour of upright sitting without use of additional cushion without pain limiting.    Time 8    Period Weeks    Status New    Target Date 06/12/20      PT LONG TERM GOAL #5   Title Patient to score >45/56 on Berg to decrease risk of falls.    Time 8    Period Weeks    Status New      Additional Long Term Goals   Additional Long Term Goals Yes      PT LONG TERM GOAL #6   Title Patient to score <12 sec on 5xSTS without  use of UEs in order to decrease risk of falls.    Time 8    Period Weeks    Status New    Target Date 06/12/20                 Plan - 04/23/20 1601    Clinical Impression Statement This is Erikka's visit tx,  she tolerated manual work and dry  needling well with increase Rt glut pliability.  She will most likely need more of this and soe work into her lumbar papaspinals.  She is very weak in her lumbar multifidi and had difficulty isolating a contraction here.  Would benefit from more PT to decrease tightness/pain and improve SIJ stability    Rehab Potential Good    PT Frequency 2x / week    PT Duration 8 weeks    PT Treatment/Interventions ADLs/Self Care Home Management;Cryotherapy;Moist Heat;Balance training;Therapeutic exercise;Therapeutic activities;Functional mobility training;Stair training;Gait training;DME Instruction;Neuromuscular re-education;Patient/family education;Manual techniques;Taping;Energy conservation;Dry needling;Passive range of motion    PT Next Visit Plan lumbar/sacrum FOTO, [place], 5xSTS, assess lumbar AROM, reassess HEP           Patient will benefit from skilled therapeutic intervention in order to improve the following deficits and impairments:  Abnormal gait, Hypomobility, Decreased activity tolerance, Decreased strength, Increased fascial restricitons, Pain, Decreased balance, Difficulty walking, Increased muscle spasms, Improper body mechanics, Decreased range of motion, Impaired flexibility, Postural dysfunction  Visit Diagnosis: Muscle weakness (generalized)  Other symptoms and signs involving the musculoskeletal system  Difficulty in walking, not elsewhere classified  Unsteadiness on feet  Obstetric damage to pelvic joints and ligaments     Problem List Patient Active Problem List   Diagnosis Date Noted  . Seasonal allergies 03/10/2019  . Vitamin D deficiency 03/10/2019  . CKD (chronic kidney disease) stage 2, GFR 60-89 ml/min 09/08/2018  . Sacroiliitis ([place]) 05/09/2018  . Edema 05/09/2018  . Abdominal pain 08/25/2017  . Chronic migraine 07/26/2017  . Chronic low back pain without sciatica 07/26/2017  . Gait abnormality 07/26/2017  . Pain of left calf 10/25/2014  . History of  treatment by mental health professional 10/02/2014  . Atypical chest pain 08/09/2014  . Routine general medical examination at a health care facility 08/09/2014  . Constipation, postoperative 11/19/2013  . Pelvic pain in female 11/17/2013  . Back pain 02/03/2012    Boneta Lucks rPT  04/23/2020, 4:03 PM  [facility] [address]  [place], [place], [address] Phone: [phone]   Fax:  [phone]  Name: Jinelle Butchko MRN: [id number] Date of Birth: [birth year]

## 2020-04-23 NOTE — Patient Instructions (Addendum)

## 2020-04-25 ENCOUNTER — Encounter: Payer: Self-pay | Admitting: Physical Therapy

## 2020-04-25 ENCOUNTER — Other Ambulatory Visit: Payer: Self-pay

## 2020-04-25 ENCOUNTER — Ambulatory Visit: Payer: 59 | Attending: Family Medicine | Admitting: Physical Therapy

## 2020-04-25 DIAGNOSIS — R2681 Unsteadiness on feet: Secondary | ICD-10-CM | POA: Insufficient documentation

## 2020-04-25 DIAGNOSIS — R262 Difficulty in walking, not elsewhere classified: Secondary | ICD-10-CM | POA: Insufficient documentation

## 2020-04-25 DIAGNOSIS — M6281 Muscle weakness (generalized): Secondary | ICD-10-CM | POA: Diagnosis present

## 2020-04-25 DIAGNOSIS — R29898 Other symptoms and signs involving the musculoskeletal system: Secondary | ICD-10-CM | POA: Insufficient documentation

## 2020-04-25 NOTE — Therapy (Signed)
[facility] [address]  [place], [place], [address] Phone: [phone]   Fax:  [phone]  Physical Therapy Treatment  Patient Details  Name: Megan Gonzalez MRN: [id number] Date of Birth: [birth year] Referring Provider (PT): Riki Sheer, [place]   Encounter Date: 04/25/2020   PT End of Session - 04/25/20 0952    Visit Number 3    Number of Visits 17    Date for PT Re-Evaluation 06/12/20    Authorization Type UHC & Medicare    PT Start Time 0851    PT Stop Time 0939   moist heat   PT Time Calculation (min) 48 min    Activity Tolerance Patient tolerated treatment well;Patient limited by pain    Behavior During Therapy [facility] for tasks assessed/performed           Past Medical History:  Diagnosis Date   Anemia    Anxiety    Arthritis    chronic low back pain   Cancer ([place])    cervical cancer; no more uterus   Depression    Fecal incontinence    Headache(784.0)    Numbness    Overactive bladder    Sleep apnea    CPAP-non compliant   Tremor     Past Surgical History:  Procedure Laterality Date   ABDOMINAL HYSTERECTOMY     APPENDECTOMY  08/25/2017   bladder neurostimulator     COLONOSCOPY  1999   DILATION AND CURETTAGE OF UTERUS     FOOT SURGERY  2006   left   INCISION AND DRAINAGE ABSCESS N/A 11/19/2013   Procedure: POSTERIOR CULPOTOMY (INCISION AND DRAINAGE OF POST OPERATIVE CUFF HEMATOMA);  Surgeon: Jonnie Kind, MD;  Location: [facility];  Service: Gynecology;  Laterality: N/A;   LAPAROSCOPIC APPENDECTOMY N/A 08/26/2017   Procedure: APPENDECTOMY LAPAROSCOPIC;  Surgeon: Jackolyn Confer, MD;  Location: [facility];  Service: General;  Laterality: N/A;   PUBOVAGINAL SLING  03/24/2012   Procedure: Gaynelle Arabian;  Surgeon: Ailene Rud, MD;  Location: [facility];  Service: Urology;  Laterality: N/A;  lynx with [employer]    REMOVAL OF URINARY SLING     SVD     x 3     TONSILLECTOMY  as child   URETERAL EXPLORATION  5./30/13   VAGINAL HYSTERECTOMY N/A 1/2/015   [employer], Dr Linus Mako and Dr Leory Plowman, with release of Pubovag sling    There were no vitals filed for this visit.   Subjective Assessment - 04/25/20 0854    Subjective "I am feeling a little bit looser. You guys are making progress on me."    Pertinent History tremor, HA, depression, cervical CA, aniety, anemia, bladder neurostimulator, L foot surgery 2006    Diagnostic tests none recent    Patient Stated Goals improve pain    Currently in Pain? Yes    Pain Score 3     Pain Location Sternum    Pain Orientation Right    Pain Descriptors / Indicators Constant    Pain Type Chronic pain              [facility] PT Assessment - 04/25/20 0001      ROM / Strength   AROM / PROM / Strength AROM      AROM   AROM Assessment Site Lumbar    Lumbar Flexion mid shin    Lumbar Extension mod-severely limited   mild pain   Lumbar - Right Side Bend distal  thigh   R SI pain   Lumbar - Left Side Bend jt line    Lumbar - Right Rotation moderately limited   R SI pain moderate   Lumbar - Left Rotation moderately limited   R SI pain mild     Balance   Balance Assessed Yes      Standardized Balance Assessment   Standardized Balance Assessment Five Times Sit to Stand    Five times sit to stand comments  trial 1: 1 minute 27 sec with B UEs, trial 2: 1 minute 22 sec with B UEs on thighs   6-7/10 pain                        [facility] Adult PT Treatment/Exercise - 04/25/20 0001      Lumbar Exercises: Stretches   Piriformis Stretch Right;1 rep;30 seconds    Piriformis Stretch Limitations sitting KTOS    Figure 4 Stretch 1 rep;With overpressure    Figure 4 Stretch Limitations R LE; good tolerance      Lumbar Exercises: Aerobic   Nustep L2 x 4 min (UEs/LEs)      Lumbar Exercises: Seated   Sit to Stand 5 reps   sitting on pt's cushion   Other Seated Lumbar Exercises sitting clam  with red TB x10      Modalities   Modalities Moist Heat      Moist Heat Therapy   Number Minutes Moist Heat 10 Minutes    Moist Heat Location Lumbar Spine                  PT Education - 04/25/20 0952    Education Details update to HEP    Person(s) Educated Patient    Methods Explanation;Demonstration;Tactile cues;Verbal cues;Handout    Comprehension Verbalized understanding;Returned demonstration            PT Short Term Goals - 04/25/20 0956      PT SHORT TERM GOAL #1   Title Patient to be independent with initial HEP.    Time 3    Period Weeks    Status On-going    Target Date 05/08/20             PT Long Term Goals - 04/25/20 0956      PT LONG TERM GOAL #1   Title Patient to be independent with advanced HEP.    Time 8    Period Weeks    Status On-going      PT LONG TERM GOAL #2   Title Patient to demonstrate B LE strength >/=4+/5.    Time 8    Period Weeks    Status On-going      PT LONG TERM GOAL #3   Title Patient to demonstrate lumbar AROM WFL and without pain limiting.    Time 8    Period Weeks    Status On-going      PT LONG TERM GOAL #4   Title Patient to report tolerance for 1 hour of upright sitting without use of additional cushion without pain limiting.    Time 8    Period Weeks    Status On-going      PT LONG TERM GOAL #5   Title Patient to score >45/56 on Berg to decrease risk of falls.    Time 8    Period Weeks    Status On-going      PT LONG TERM GOAL #6   Title Patient to  score <12 sec on 5xSTS without use of UEs in order to decrease risk of falls.    Time 8    Period Weeks    Status On-going                 Plan - 04/25/20 0954    Clinical Impression Statement Patient reported improvement in sensation of tightness since last session and notes good benefit from DN. Lumbar AROM was assessed, which revealed a decrease in mobility and c/o pain. Patient demonstrated heavy UE use and difficulty with STS  transfers, and demonstrated increased risk of falls with 5xSTS test. Patient was able to demonstrate much improved set up and performance with transfers after practice and cueing provided. Patient was able to tolerate gentle LE stretching and strengthening without complaints. Updated HEP with exercises that were well-tolerated today. Patient reported understanding. Ended session with moist heat to LB for pain relief. No complaints at end of session.    Comorbidities tremor, HA, depression, cervical CA, aniety, anemia, bladder neurostimulator, L foot surgery 2006    Rehab Potential Good    PT Frequency 2x / week    PT Duration 8 weeks    PT Treatment/Interventions ADLs/Self Care Home Management;Cryotherapy;Moist Heat;Balance training;Therapeutic exercise;Therapeutic activities;Functional mobility training;Stair training;Gait training;DME Instruction;Neuromuscular re-education;Patient/family education;Manual techniques;Taping;Energy conservation;Dry needling;Passive range of motion    PT Next Visit Plan lumbar/sacrum FOTO, Berg, reassess HEP    Consulted and Agree with Plan of Care Patient           Patient will benefit from skilled therapeutic intervention in order to improve the following deficits and impairments:  Abnormal gait, Hypomobility, Decreased activity tolerance, Decreased strength, Increased fascial restricitons, Pain, Decreased balance, Difficulty walking, Increased muscle spasms, Improper body mechanics, Decreased range of motion, Impaired flexibility, Postural dysfunction  Visit Diagnosis: Muscle weakness (generalized)  Other symptoms and signs involving the musculoskeletal system  Difficulty in walking, not elsewhere classified  Unsteadiness on feet  Obstetric damage to pelvic joints and ligaments     Problem List Patient Active Problem List   Diagnosis Date Noted   Seasonal allergies 03/10/2019   Vitamin D deficiency 03/10/2019   CKD (chronic kidney disease) stage  2, GFR 60-89 ml/min 09/08/2018   Sacroiliitis ([place]) 05/09/2018   Edema 05/09/2018   Abdominal pain 08/25/2017   Chronic migraine 07/26/2017   Chronic low back pain without sciatica 07/26/2017   Gait abnormality 07/26/2017   Pain of left calf 10/25/2014   History of treatment by mental health professional 10/02/2014   Atypical chest pain 08/09/2014   Routine general medical examination at a health care facility 08/09/2014   Constipation, postoperative 11/19/2013   Pelvic pain in female 11/17/2013   Back pain 02/03/2012     Janene Harvey, PT, DPT 04/25/20 10:08 AM   [facility] [address]  [place], [place], [address] Phone: [phone]   Fax:  [phone]  Name: Megan Gonzalez MRN: [id number] Date of Birth: 12-14-1970

## 2020-05-01 ENCOUNTER — Encounter: Payer: Self-pay | Admitting: Physical Therapy

## 2020-05-01 ENCOUNTER — Other Ambulatory Visit: Payer: Self-pay

## 2020-05-01 ENCOUNTER — Ambulatory Visit: Payer: 59 | Admitting: Physical Therapy

## 2020-05-01 DIAGNOSIS — R262 Difficulty in walking, not elsewhere classified: Secondary | ICD-10-CM

## 2020-05-01 DIAGNOSIS — M6281 Muscle weakness (generalized): Secondary | ICD-10-CM

## 2020-05-01 DIAGNOSIS — R29898 Other symptoms and signs involving the musculoskeletal system: Secondary | ICD-10-CM

## 2020-05-01 DIAGNOSIS — R2681 Unsteadiness on feet: Secondary | ICD-10-CM

## 2020-05-01 NOTE — Therapy (Signed)
[facility] [address]  [place], [place], [address] Phone: [phone]   Fax:  [phone]  Physical Therapy Treatment  Patient Details  Name: Megan Gonzalez MRN: [id number] Date of Birth: 12-08-1970 Referring Provider (PT): Riki Sheer, DO   Encounter Date: 05/01/2020   PT End of Session - 05/01/20 1501    Visit Number 4    Number of Visits 17    Date for PT Re-Evaluation 06/12/20    Authorization Type UHC & Medicare    PT Start Time 2947    PT Stop Time 1407    PT Time Calculation (min) 44 min    Activity Tolerance Patient tolerated treatment well;Patient limited by pain    Behavior During Therapy [facility] for tasks assessed/performed           Past Medical History:  Diagnosis Date  . Anemia   . Anxiety   . Arthritis    chronic low back pain  . Cancer ([place])    cervical cancer; no more uterus  . Depression   . Fecal incontinence   . Headache(784.0)   . Numbness   . Overactive bladder   . Sleep apnea    CPAP-non compliant  . Tremor     Past Surgical History:  Procedure Laterality Date  . ABDOMINAL HYSTERECTOMY    . APPENDECTOMY  08/25/2017  . bladder neurostimulator    . COLONOSCOPY  1999  . DILATION AND CURETTAGE OF UTERUS    . FOOT SURGERY  2006   left  . INCISION AND DRAINAGE ABSCESS N/A 11/19/2013   Procedure: POSTERIOR CULPOTOMY (INCISION AND DRAINAGE OF POST OPERATIVE CUFF HEMATOMA);  Surgeon: Jonnie Kind, MD;  Location: [facility];  Service: Gynecology;  Laterality: N/A;  . LAPAROSCOPIC APPENDECTOMY N/A 08/26/2017   Procedure: APPENDECTOMY LAPAROSCOPIC;  Surgeon: Jackolyn Confer, MD;  Location: [facility];  Service: General;  Laterality: N/A;  . PUBOVAGINAL SLING  03/24/2012   Procedure: Gaynelle Arabian;  Surgeon: Ailene Rud, MD;  Location: [facility];  Service: Urology;  Laterality: N/A;  lynx with [employer]   . REMOVAL OF URINARY SLING    . SVD     x 3  .  TONSILLECTOMY  as child  . URETERAL EXPLORATION  5./30/13  . VAGINAL HYSTERECTOMY N/A 1/2/015   [employer], Dr Linus Mako and Dr Leory Plowman, with release of Pubovag sling    There were no vitals filed for this visit.   Subjective Assessment - 05/01/20 1326    Subjective Had some pain in the SI joint at night one night but was able to tolerate it without meds. Has some pain that ran down her L leg into the heel after her last appointment.    Pertinent History tremor, HA, depression, cervical CA, aniety, anemia, bladder neurostimulator, L foot surgery 2006    Diagnostic tests none recent    Patient Stated Goals improve pain    Currently in Pain? No/denies                             [facility] Adult PT Treatment/Exercise - 05/01/20 0001      Lumbar Exercises: Stretches   Figure 4 Stretch 2 reps;30 seconds;With overpressure    Figure 4 Stretch Limitations sitting; to tolerance      Lumbar Exercises: Aerobic   Nustep L3 x 4 min (UEs/LEs)      Lumbar Exercises: Seated   Sit to  Stand 5 reps   sitting on pt's cushion     Lumbar Exercises: Supine   Ab Set 5 reps    AB Set Limitations hooklying TrA contraction with heavy VC's and TC's for proper self-palpation and contraction   very minimal ab contraction palpated   Other Supine Lumbar Exercises B hip flexors isometric with B LEs on orange pball 10x5"      Manual Therapy   Manual Therapy Soft tissue mobilization;Myofascial release    Manual therapy comments prone    Soft tissue mobilization STM to R piriformis and proximal glutes- very tight throughout, mild tenderness    Myofascial Release manual TPR to R piriformis and proximal glutes   frequent jump sign                 PT Education - 05/01/20 1501    Education Details update to HEP    Person(s) Educated Patient    Methods Explanation;Demonstration;Tactile cues;Verbal cues;Handout    Comprehension Verbalized understanding;Returned demonstration              PT Short Term Goals - 04/25/20 0956      PT SHORT TERM GOAL #1   Title Patient to be independent with initial HEP.    Time 3    Period Weeks    Status On-going    Target Date 05/08/20             PT Long Term Goals - 04/25/20 0956      PT LONG TERM GOAL #1   Title Patient to be independent with advanced HEP.    Time 8    Period Weeks    Status On-going      PT LONG TERM GOAL #2   Title Patient to demonstrate B LE strength >/=4+/5.    Time 8    Period Weeks    Status On-going      PT LONG TERM GOAL #3   Title Patient to demonstrate lumbar AROM WFL and without pain limiting.    Time 8    Period Weeks    Status On-going      PT LONG TERM GOAL #4   Title Patient to report tolerance for 1 hour of upright sitting without use of additional cushion without pain limiting.    Time 8    Period Weeks    Status On-going      PT LONG TERM GOAL #5   Title Patient to score >45/56 on Berg to decrease risk of falls.    Time 8    Period Weeks    Status On-going      PT LONG TERM GOAL #6   Title Patient to score <12 sec on 5xSTS without use of UEs in order to decrease risk of falls.    Time 8    Period Weeks    Status On-going                 Plan - 05/01/20 1501    Clinical Impression Statement Patient without new complaints. Reviewed STS transfers with reminder on proper sitting and foot position for improved ease of transfer. Worked on initiating TrA activation in hooklying, however patient unable to isolate this muscle and had trouble co-contracting her abdominals. Patient was able to feel and demonstrate an abdominal contraction with isometric hip flexion. Tolerated STM and manual TPR to R piriformis and proximal glutes. Patient with mild tenderness to these muscle groups and with considerable soft tissue restriction and frequent jump sign.  Patient reported understanding of new HEP update and without complaints at end of session.    Comorbidities tremor, HA,  depression, cervical CA, aniety, anemia, bladder neurostimulator, L foot surgery 2006    Rehab Potential Good    PT Frequency 2x / week    PT Duration 8 weeks    PT Treatment/Interventions ADLs/Self Care Home Management;Cryotherapy;Moist Heat;Balance training;Therapeutic exercise;Therapeutic activities;Functional mobility training;Stair training;Gait training;DME Instruction;Neuromuscular re-education;Patient/family education;Manual techniques;Taping;Energy conservation;Dry needling;Passive range of motion    PT Next Visit Plan lumbar/sacrum FOTO, Berg, reassess HEP    Consulted and Agree with Plan of Care Patient           Patient will benefit from skilled therapeutic intervention in order to improve the following deficits and impairments:  Abnormal gait, Hypomobility, Decreased activity tolerance, Decreased strength, Increased fascial restricitons, Pain, Decreased balance, Difficulty walking, Increased muscle spasms, Improper body mechanics, Decreased range of motion, Impaired flexibility, Postural dysfunction  Visit Diagnosis: Obstetric damage to pelvic joints and ligaments  Muscle weakness (generalized)  Difficulty in walking, not elsewhere classified  Other symptoms and signs involving the musculoskeletal system  Unsteadiness on feet     Problem List Patient Active Problem List   Diagnosis Date Noted  . Seasonal allergies 03/10/2019  . Vitamin D deficiency 03/10/2019  . CKD (chronic kidney disease) stage 2, GFR 60-89 ml/min 09/08/2018  . Sacroiliitis ([place]) 05/09/2018  . Edema 05/09/2018  . Abdominal pain 08/25/2017  . Chronic migraine 07/26/2017  . Chronic low back pain without sciatica 07/26/2017  . Gait abnormality 07/26/2017  . Pain of left calf 10/25/2014  . History of treatment by mental health professional 10/02/2014  . Atypical chest pain 08/09/2014  . Routine general medical examination at a health care facility 08/09/2014  . Constipation, postoperative  11/19/2013  . Pelvic pain in female 11/17/2013  . Back pain 02/03/2012     Janene Harvey, PT, DPT 05/01/20 3:08 PM   [facility] [address]  [place], [place], [address] Phone: [phone]   Fax:  [phone]  Name: Megan Gonzalez MRN: [id number] Date of Birth: 1971-10-05

## 2020-05-12 DIAGNOSIS — E559 Vitamin D deficiency, unspecified: Secondary | ICD-10-CM

## 2020-05-13 ENCOUNTER — Encounter: Payer: Self-pay | Admitting: Physical Therapy

## 2020-05-13 ENCOUNTER — Other Ambulatory Visit: Payer: Self-pay

## 2020-05-13 ENCOUNTER — Ambulatory Visit: Payer: 59 | Admitting: Physical Therapy

## 2020-05-13 DIAGNOSIS — M6281 Muscle weakness (generalized): Secondary | ICD-10-CM | POA: Diagnosis not present

## 2020-05-13 DIAGNOSIS — R29898 Other symptoms and signs involving the musculoskeletal system: Secondary | ICD-10-CM

## 2020-05-13 DIAGNOSIS — R2681 Unsteadiness on feet: Secondary | ICD-10-CM

## 2020-05-13 DIAGNOSIS — R262 Difficulty in walking, not elsewhere classified: Secondary | ICD-10-CM

## 2020-05-13 MED ORDER — VITAMIN D (ERGOCALCIFEROL) 1.25 MG (50000 UNIT) PO CAPS: 50000.0000 [IU] | ORAL_CAPSULE | ORAL | 0 refills | Status: DC

## 2020-05-13 NOTE — Therapy (Signed)
[facility] [place] [address]  [address] [place], [place], [address] Phone: [phone]   Fax:  [phone]  Physical Therapy Treatment  Patient Details  Name: Megan Gonzalez MRN: [id number] Date of Birth: 10/21/1971 Referring Provider (PT): Nicholas Wendling, DO   Encounter Date: 05/13/2020   PT End of Session - 05/13/20 1532    Visit Number 5    Number of Visits 17    Date for PT Re-Evaluation 06/12/20    Authorization Type UHC & Medicare    PT Start Time 1532    PT Stop Time 1626    PT Time Calculation (min) 54 min    Activity Tolerance Patient tolerated treatment well;Patient limited by pain    Behavior During Therapy WFL for tasks assessed/performed           Past Medical History:  Diagnosis Date  . Anemia   . Anxiety   . Arthritis    chronic low back pain  . Cancer (HCC)    cervical cancer; no more uterus  . Depression   . Fecal incontinence   . Headache(784.0)   . Numbness   . Overactive bladder   . Sleep apnea    CPAP-non compliant  . Tremor     Past Surgical History:  Procedure Laterality Date  . ABDOMINAL HYSTERECTOMY    . APPENDECTOMY  08/25/2017  . bladder neurostimulator    . COLONOSCOPY  1999  . DILATION AND CURETTAGE OF UTERUS    . FOOT SURGERY  2006   left  . INCISION AND DRAINAGE ABSCESS N/A 11/19/2013   Procedure: POSTERIOR CULPOTOMY (INCISION AND DRAINAGE OF POST OPERATIVE CUFF HEMATOMA);  Surgeon: John V Ferguson, MD;  Location: [facility];  Service: Gynecology;  Laterality: N/A;  . LAPAROSCOPIC APPENDECTOMY N/A 08/26/2017   Procedure: APPENDECTOMY LAPAROSCOPIC;  Surgeon: Rosenbower, Todd, MD;  Location: [facility];  Service: General;  Laterality: N/A;  . PUBOVAGINAL SLING  03/24/2012   Procedure: PUBO-VAGINAL SLING;  Surgeon: Sigmund I Tannenbaum, MD;  Location: [facility];  Service: Urology;  Laterality: N/A;  lynx with [employer]   . REMOVAL OF URINARY SLING    . SVD     x 3  .  TONSILLECTOMY  as child  . URETERAL EXPLORATION  5./30/13  . VAGINAL HYSTERECTOMY N/A 1/2/015   [employer], Dr Siddiqui and Dr Bradley, with release of Pubovag sling    There were no vitals filed for this visit.   Subjective Assessment - 05/13/20 1537    Subjective Pt reports pain is intermittent - "comes out of nowhere" w/o consistent trigger.    Pertinent History tremor, HA, depression, cervical CA, aniety, anemia, bladder neurostimulator, L foot surgery 2006    Diagnostic tests none recent    Patient Stated Goals improve pain    Currently in Pain? Yes    Pain Score 4     Pain Location Buttocks    Pain Orientation Right    Pain Descriptors / Indicators Squeezing;Throbbing;Stabbing    Pain Type Chronic pain    Pain Frequency Intermittent                             [facility] Adult PT Treatment/Exercise - 05/13/20 1532      Lumbar Exercises: Aerobic   Nustep L3 x 6 min (UEs/LEs)      Lumbar Exercises: Supine   Isometric Hip Flexion 10 reps;5 seconds    Isometric Hip   Flexion Limitations LEs supported on orange Pball      Modalities   Modalities Moist Heat      Moist Heat Therapy   Number Minutes Moist Heat 10 Minutes    Moist Heat Location --   glutes in prone     Manual Therapy   Manual Therapy Soft tissue mobilization;Myofascial release    Manual therapy comments skilled palpation and monitoring during dry needling    Soft tissue mobilization STM to R glutes, piriformis, TFL, iliacus and proximal quads    Myofascial Release manual TPR to R piriformis and proximal glutes following DN with decreased jump sign            Trigger Point Dry Needling - 05/13/20 1532    Consent Given? Yes    Education Handout Provided Previously provided    Muscles Treated Lower Quadrant Quadriceps   Rt proximal   Muscles Treated Back/Hip Gluteus minimus;Gluteus medius;Gluteus maximus;Piriformis;Tensor fascia lata;Iliacus   Rt   Quadriceps Response Palpable increased  muscle length;Twitch response elicited    Gluteus Minimus Response Palpable increased muscle length;Twitch response elicited    Gluteus Medius Response Palpable increased muscle length;Twitch response elicited    Gluteus Maximus Response Palpable increased muscle length;Twitch response elicited    Piriformis Response Palpable increased muscle length    Tensor Fascia Lata Response Palpable increased muscle length;Twitch response elicited    Iliacus Response Palpable increased muscle length;Twitch response elicited                  PT Short Term Goals - 05/13/20 1540      PT SHORT TERM GOAL #1   Title Patient to be independent with initial HEP.    Time 3    Period Weeks    Status Achieved    Target Date 05/08/20             PT Long Term Goals - 04/25/20 0956      PT LONG TERM GOAL #1   Title Patient to be independent with advanced HEP.    Time 8    Period Weeks    Status On-going      PT LONG TERM GOAL #2   Title Patient to demonstrate B LE strength >/=4+/5.    Time 8    Period Weeks    Status On-going      PT LONG TERM GOAL #3   Title Patient to demonstrate lumbar AROM WFL and without pain limiting.    Time 8    Period Weeks    Status On-going      PT LONG TERM GOAL #4   Title Patient to report tolerance for 1 hour of upright sitting without use of additional cushion without pain limiting.    Time 8    Period Weeks    Status On-going      PT LONG TERM GOAL #5   Title Patient to score >45/56 on Berg to decrease risk of falls.    Time 8    Period Weeks    Status On-going      PT LONG TERM GOAL #6   Title Patient to score <12 sec on 5xSTS without use of UEs in order to decrease risk of falls.    Time 8    Period Weeks    Status On-going                 Plan - 05/13/20 1626    Clinical Impression Statement Shamari reports increased pain today  w/o known trigger, stating "pain comes out of nowhere" sometimes. She reports initial HEP is going  well and denies any concerns (STG met) but does states that she has not yet found a tennis ball to use for the self-STM. She noted benefit from DN in previous session and is requesting this today, therefore session focusing on manual therapy incorporating DN to R hip and buttock musculature - good twitch responses elicited with palpable reduction in ttp and muscle tension. Session concluded with review of latest HEP update as she feels that she is still unsure of the proper muscle abdominal activation but admits to not having tried with LEs elevated as instructed.    Comorbidities tremor, HA, depression, cervical CA, aniety, anemia, bladder neurostimulator, L foot surgery 2006    Rehab Potential Good    PT Frequency 2x / week    PT Duration 8 weeks    PT Treatment/Interventions ADLs/Self Care Home Management;Cryotherapy;Moist Heat;Balance training;Therapeutic exercise;Therapeutic activities;Functional mobility training;Stair training;Gait training;DME Instruction;Neuromuscular re-education;Patient/family education;Manual techniques;Taping;Energy conservation;Dry needling;Passive range of motion    PT Next Visit Plan lumbar/sacrum FOTO, Berg, reassess HEP    Consulted and Agree with Plan of Care Patient           Patient will benefit from skilled therapeutic intervention in order to improve the following deficits and impairments:  Abnormal gait, Hypomobility, Decreased activity tolerance, Decreased strength, Increased fascial restricitons, Pain, Decreased balance, Difficulty walking, Increased muscle spasms, Improper body mechanics, Decreased range of motion, Impaired flexibility, Postural dysfunction  Visit Diagnosis: Obstetric damage to pelvic joints and ligaments  Muscle weakness (generalized)  Difficulty in walking, not elsewhere classified  Other symptoms and signs involving the musculoskeletal system  Unsteadiness on feet     Problem List Patient Active Problem List   Diagnosis  Date Noted  . Seasonal allergies 03/10/2019  . Vitamin D deficiency 03/10/2019  . CKD (chronic kidney disease) stage 2, GFR 60-89 ml/min 09/08/2018  . Sacroiliitis (HCC) 05/09/2018  . Edema 05/09/2018  . Abdominal pain 08/25/2017  . Chronic migraine 07/26/2017  . Chronic low back pain without sciatica 07/26/2017  . Gait abnormality 07/26/2017  . Pain of left calf 10/25/2014  . History of treatment by mental health professional 10/02/2014  . Atypical chest pain 08/09/2014  . Routine general medical examination at a health care facility 08/09/2014  . Constipation, postoperative 11/19/2013  . Pelvic pain in female 11/17/2013  . Back pain 02/03/2012    JoAnne M Kreis, PT, MPT 05/13/2020, 6:21 PM  [facility] [place] [address]  [address] [place], [place], [address] Phone: [phone]   Fax:  [phone]  Name: Priyanka Bulger MRN: [id number] Date of Birth: 03/26/1971   

## 2020-05-16 ENCOUNTER — Ambulatory Visit: Payer: 59 | Admitting: Physical Therapy

## 2020-05-20 ENCOUNTER — Ambulatory Visit: Payer: 59 | Admitting: Physical Therapy

## 2020-05-20 ENCOUNTER — Other Ambulatory Visit: Payer: Self-pay

## 2020-05-20 DIAGNOSIS — M6281 Muscle weakness (generalized): Secondary | ICD-10-CM | POA: Diagnosis not present

## 2020-05-20 DIAGNOSIS — R262 Difficulty in walking, not elsewhere classified: Secondary | ICD-10-CM

## 2020-05-20 DIAGNOSIS — R2681 Unsteadiness on feet: Secondary | ICD-10-CM

## 2020-05-20 DIAGNOSIS — R29898 Other symptoms and signs involving the musculoskeletal system: Secondary | ICD-10-CM

## 2020-05-20 NOTE — Therapy (Signed)
[facility] [place] [address]  [place], [place], [address] Phone: [phone]   Fax:  [phone]  Physical Therapy Treatment  Patient Details  Name: Kyrah Schiro MRN: [id number] Date of Birth: 17-Nov-1970 Referring Provider (PT): Riki Sheer, DO   Encounter Date: 05/20/2020   PT End of Session - 05/20/20 1617    Visit Number 6    Number of Visits 17    Date for PT Re-Evaluation 06/12/20    Authorization Type UHC & Medicare    PT Start Time 8381    PT Stop Time 1706    PT Time Calculation (min) 49 min    Activity Tolerance Patient tolerated treatment well;Patient limited by pain    Behavior During Therapy [facility] for tasks assessed/performed           Past Medical History:  Diagnosis Date  . Anemia   . Anxiety   . Arthritis    chronic low back pain  . Cancer ([place])    cervical cancer; no more uterus  . Depression   . Fecal incontinence   . Headache(784.0)   . Numbness   . Overactive bladder   . Sleep apnea    CPAP-non compliant  . Tremor     Past Surgical History:  Procedure Laterality Date  . ABDOMINAL HYSTERECTOMY    . APPENDECTOMY  08/25/2017  . bladder neurostimulator    . COLONOSCOPY  1999  . DILATION AND CURETTAGE OF UTERUS    . FOOT SURGERY  2006   left  . INCISION AND DRAINAGE ABSCESS N/A 11/19/2013   Procedure: POSTERIOR CULPOTOMY (INCISION AND DRAINAGE OF POST OPERATIVE CUFF HEMATOMA);  Surgeon: Jonnie Kind, MD;  Location: [facility];  Service: Gynecology;  Laterality: N/A;  . LAPAROSCOPIC APPENDECTOMY N/A 08/26/2017   Procedure: APPENDECTOMY LAPAROSCOPIC;  Surgeon: Jackolyn Confer, MD;  Location: [facility];  Service: General;  Laterality: N/A;  . PUBOVAGINAL SLING  03/24/2012   Procedure: Gaynelle Arabian;  Surgeon: Ailene Rud, MD;  Location: [facility];  Service: Urology;  Laterality: N/A;  lynx with [employer]   . REMOVAL OF URINARY SLING    . SVD     x 3  .  TONSILLECTOMY  as child  . URETERAL EXPLORATION  5./30/13  . VAGINAL HYSTERECTOMY N/A 1/2/015   [employer], Dr Linus Mako and Dr Leory Plowman, with release of Pubovag sling    There were no vitals filed for this visit.   Subjective Assessment - 05/20/20 1624    Subjective Pt reports pain has been "so-so". Feels that DN is still helping.    Pertinent History tremor, HA, depression, cervical CA, aniety, anemia, bladder neurostimulator, L foot surgery 2006    Diagnostic tests none recent    Patient Stated Goals improve pain    Pain Score 5     Pain Location Sacrum    Pain Orientation Right    Pain Descriptors / Indicators Throbbing;Squeezing    Pain Type Chronic pain    Pain Frequency Intermittent                             [facility] Adult PT Treatment/Exercise - 05/20/20 1617      Lumbar Exercises: Aerobic   Nustep L3 x 6 min (UEs/LEs)      Lumbar Exercises: Supine   Clam 10 reps;3 seconds    Clam Limitations TrA, red TB    Dead Bug 10 reps;3  seconds    Dead Bug Limitations LE only from hook lying    Bridge 10 reps;5 seconds    Bridge Limitations + red TB hip ABD isometric      Modalities   Modalities Moist Heat      Moist Heat Therapy   Number Minutes Moist Heat 5 Minutes    Moist Heat Location --   glutes in prone     Manual Therapy   Manual Therapy Soft tissue mobilization;Myofascial release    Manual therapy comments skilled palpation and monitoring during dry needling    Soft tissue mobilization STM to R glutes & piriformis, TFL, iliacus    Myofascial Release manual TPR to R piriformis and proximal glutes following DN with decreased jump sign            Trigger Point Dry Needling - 05/20/20 1617    Consent Given? Yes    Education Handout Provided Previously provided    Muscles Treated Back/Hip Gluteus minimus;Gluteus medius;Gluteus maximus;Piriformis   Rt   Electrical Stimulation Performed with Dry Needling Yes    E-stim with Dry Needling  Details Rt piriformis    Gluteus Minimus Response Palpable increased muscle length;Twitch response elicited    Gluteus Medius Response Palpable increased muscle length;Twitch response elicited    Gluteus Maximus Response Palpable increased muscle length;Twitch response elicited    Piriformis Response Palpable increased muscle length                  PT Short Term Goals - 05/13/20 1540      PT SHORT TERM GOAL #1   Title Patient to be independent with initial HEP.    Time 3    Period Weeks    Status Achieved    Target Date 05/08/20             PT Long Term Goals - 04/25/20 0956      PT LONG TERM GOAL #1   Title Patient to be independent with advanced HEP.    Time 8    Period Weeks    Status On-going      PT LONG TERM GOAL #2   Title Patient to demonstrate B LE strength >/=4+/5.    Time 8    Period Weeks    Status On-going      PT LONG TERM GOAL #3   Title Patient to demonstrate lumbar AROM WFL and without pain limiting.    Time 8    Period Weeks    Status On-going      PT LONG TERM GOAL #4   Title Patient to report tolerance for 1 hour of upright sitting without use of additional cushion without pain limiting.    Time 8    Period Weeks    Status On-going      PT LONG TERM GOAL #5   Title Patient to score >45/56 on Berg to decrease risk of falls.    Time 8    Period Weeks    Status On-going      PT LONG TERM GOAL #6   Title Patient to score <12 sec on 5xSTS without use of UEs in order to decrease risk of falls.    Time 8    Period Weeks    Status On-going                 Plan - 05/20/20 1706    Clinical Impression Statement Reeta reports she is able to lift her leg better since the  DN last session and no longer notes any anterior hip/upper thigh pain but still reporting medial R buttock/sacral pain - addressed with further manual therapy incorporating DN with good twitch responses elicited resulting in palpable reduction in muscle tension  and ttp. She feels that she is better able to recruit abdominal muscles with current HEP after review last session and able to tolerate progression of lumbopelvic strengthening well today other than still not fully comfortable with laying supine.    Comorbidities tremor, HA, depression, cervical CA, aniety, anemia, bladder neurostimulator, L foot surgery 2006    Rehab Potential Good    PT Frequency 2x / week    PT Duration 8 weeks    PT Treatment/Interventions ADLs/Self Care Home Management;Cryotherapy;Moist Heat;Balance training;Therapeutic exercise;Therapeutic activities;Functional mobility training;Stair training;Gait training;DME Instruction;Neuromuscular re-education;Patient/family education;Manual techniques;Taping;Energy conservation;Dry needling;Passive range of motion    PT Next Visit Plan lumbar/sacrum FOTO, Berg, reassess HEP    Consulted and Agree with Plan of Care Patient           Patient will benefit from skilled therapeutic intervention in order to improve the following deficits and impairments:  Abnormal gait, Hypomobility, Decreased activity tolerance, Decreased strength, Increased fascial restricitons, Pain, Decreased balance, Difficulty walking, Increased muscle spasms, Improper body mechanics, Decreased range of motion, Impaired flexibility, Postural dysfunction  Visit Diagnosis: Obstetric damage to pelvic joints and ligaments  Muscle weakness (generalized)  Difficulty in walking, not elsewhere classified  Other symptoms and signs involving the musculoskeletal system  Unsteadiness on feet     Problem List Patient Active Problem List   Diagnosis Date Noted  . Seasonal allergies 03/10/2019  . Vitamin D deficiency 03/10/2019  . CKD (chronic kidney disease) stage 2, GFR 60-89 ml/min 09/08/2018  . Sacroiliitis ([place]) 05/09/2018  . Edema 05/09/2018  . Abdominal pain 08/25/2017  . Chronic migraine 07/26/2017  . Chronic low back pain without sciatica 07/26/2017  .  Gait abnormality 07/26/2017  . Pain of left calf 10/25/2014  . History of treatment by mental health professional 10/02/2014  . Atypical chest pain 08/09/2014  . Routine general medical examination at a health care facility 08/09/2014  . Constipation, postoperative 11/19/2013  . Pelvic pain in female 11/17/2013  . Back pain 02/03/2012    Percival Spanish, PT, MPT 05/20/2020, 5:12 PM  [facility] [address]  [place], [place], [address] Phone: [phone]   Fax:  [phone]  Name: Rafeef Lau MRN: [id number] Date of Birth: [birth year]

## 2020-05-23 ENCOUNTER — Ambulatory Visit: Payer: 59 | Admitting: Physical Therapy

## 2020-05-23 ENCOUNTER — Encounter: Payer: Self-pay | Admitting: Physical Therapy

## 2020-05-23 ENCOUNTER — Other Ambulatory Visit: Payer: Self-pay

## 2020-05-23 DIAGNOSIS — M6281 Muscle weakness (generalized): Secondary | ICD-10-CM

## 2020-05-23 DIAGNOSIS — R2681 Unsteadiness on feet: Secondary | ICD-10-CM

## 2020-05-23 DIAGNOSIS — R29898 Other symptoms and signs involving the musculoskeletal system: Secondary | ICD-10-CM

## 2020-05-23 DIAGNOSIS — R262 Difficulty in walking, not elsewhere classified: Secondary | ICD-10-CM

## 2020-05-23 NOTE — Therapy (Signed)
[facility] [address]  [place], [place], [address] Phone: [phone]   Fax:  [phone]  Physical Therapy Treatment  Patient Details  Name: Megan Gonzalez MRN: [id number] Date of Birth: 1971-06-15 Referring Provider (PT): Riki Sheer, DO   Encounter Date: 05/23/2020   PT End of Session - 05/23/20 1136    Visit Number 7    Number of Visits 17    Date for PT Re-Evaluation 06/12/20    Authorization Type UHC & Medicare    PT Start Time 0847    PT Stop Time 0926    PT Time Calculation (min) 39 min    Activity Tolerance Patient tolerated treatment well    Behavior During Therapy [facility] for tasks assessed/performed           Past Medical History:  Diagnosis Date  . Anemia   . Anxiety   . Arthritis    chronic low back pain  . Cancer ([place])    cervical cancer; no more uterus  . Depression   . Fecal incontinence   . Headache(784.0)   . Numbness   . Overactive bladder   . Sleep apnea    CPAP-non compliant  . Tremor     Past Surgical History:  Procedure Laterality Date  . ABDOMINAL HYSTERECTOMY    . APPENDECTOMY  08/25/2017  . bladder neurostimulator    . COLONOSCOPY  1999  . DILATION AND CURETTAGE OF UTERUS    . FOOT SURGERY  2006   left  . INCISION AND DRAINAGE ABSCESS N/A 11/19/2013   Procedure: POSTERIOR CULPOTOMY (INCISION AND DRAINAGE OF POST OPERATIVE CUFF HEMATOMA);  Surgeon: Jonnie Kind, MD;  Location: [facility];  Service: Gynecology;  Laterality: N/A;  . LAPAROSCOPIC APPENDECTOMY N/A 08/26/2017   Procedure: APPENDECTOMY LAPAROSCOPIC;  Surgeon: Jackolyn Confer, MD;  Location: [facility];  Service: General;  Laterality: N/A;  . PUBOVAGINAL SLING  03/24/2012   Procedure: Gaynelle Arabian;  Surgeon: Ailene Rud, MD;  Location: [facility];  Service: Urology;  Laterality: N/A;  lynx with Chemical engineer   . REMOVAL OF URINARY SLING    . SVD     x 3  . TONSILLECTOMY  as child  .  URETERAL EXPLORATION  5./30/13  . VAGINAL HYSTERECTOMY N/A 1/2/015   [employer], Dr Linus Mako and Dr Leory Plowman, with release of Pubovag sling    There were no vitals filed for this visit.   Subjective Assessment - 05/23/20 0849    Subjective Doing okay since last session. Feels like the DN helped.    Pertinent History tremor, HA, depression, cervical CA, aniety, anemia, bladder neurostimulator, L foot surgery 2006    Diagnostic tests none recent    Patient Stated Goals improve pain    Currently in Pain? Yes    Pain Score 7     Pain Location Sacrum    Pain Orientation Right    Pain Descriptors / Indicators --   "stiff"   Pain Type Chronic pain                             [facility] Adult PT Treatment/Exercise - 05/23/20 0001      Lumbar Exercises: Aerobic   Nustep L3 x 6 min (UEs/LEs)      Lumbar Exercises: Seated   Sit to Stand 10 reps   last 5 reps with red TB above knees; sitting on cushion   Other  Seated Lumbar Exercises sitting alt clamshell with red TB x10   cues for core contraction   Other Seated Lumbar Exercises isometric ab set with beach ball in lap 10x5"   cues to avoid rounding spine     Lumbar Exercises: Supine   Clam 15 reps;3 seconds    Clam Limitations green Loop    Bridge with [employer] 10 reps    Bridge with Ball Squeeze Limitations limited ROM and hip instability                   PT Education - 05/23/20 1136    Education Details update to HEP for max benefit    Person(s) Educated Patient    Methods Explanation;Demonstration;Tactile cues;Verbal cues;Handout    Comprehension Verbalized understanding;Returned demonstration            PT Short Term Goals - 05/13/20 1540      PT SHORT TERM GOAL #1   Title Patient to be independent with initial HEP.    Time 3    Period Weeks    Status Achieved    Target Date 05/08/20             PT Long Term Goals - 04/25/20 0956      PT LONG TERM GOAL #1   Title Patient to be  independent with advanced HEP.    Time 8    Period Weeks    Status On-going      PT LONG TERM GOAL #2   Title Patient to demonstrate B LE strength >/=4+/5.    Time 8    Period Weeks    Status On-going      PT LONG TERM GOAL #3   Title Patient to demonstrate lumbar AROM WFL and without pain limiting.    Time 8    Period Weeks    Status On-going      PT LONG TERM GOAL #4   Title Patient to report tolerance for 1 hour of upright sitting without use of additional cushion without pain limiting.    Time 8    Period Weeks    Status On-going      PT LONG TERM GOAL #5   Title Patient to score >45/56 on Berg to decrease risk of falls.    Time 8    Period Weeks    Status On-going      PT LONG TERM GOAL #6   Title Patient to score <12 sec on 5xSTS without use of UEs in order to decrease risk of falls.    Time 8    Period Weeks    Status On-going                 Plan - 05/23/20 1137    Clinical Impression Statement Patient reporting continued benefit from DN. Notes some difficulty with TrA activation with HEP, but hip flexibility is improving. Worked on eliciting an abdominal contraction in sitting with patient requiring intermittent cues to avoid rounding through spine and instead isolating an abdominal contraction. Patient reported good ability/understanding. Worked on combining core and hip activation with subsequent ther-ex with good effort. Patient did demonstrate limited ROM and hip instability with bridges but with good focus to correct this throughout. Able to demonstrate an excellent improvement in STS transfers with and without resistance at hips. Updated HEP with exercises that were well-tolerated today. Patient reported understanding and without complaints at end of session.    Comorbidities tremor, HA, depression, cervical CA, aniety, anemia,  bladder neurostimulator, L foot surgery 2006    Rehab Potential Good    PT Frequency 2x / week    PT Duration 8 weeks    PT  Treatment/Interventions ADLs/Self Care Home Management;Cryotherapy;Moist Heat;Balance training;Therapeutic exercise;Therapeutic activities;Functional mobility training;Stair training;Gait training;DME Instruction;Neuromuscular re-education;Patient/family education;Manual techniques;Taping;Energy conservation;Dry needling;Passive range of motion    PT Next Visit Plan lumbar/sacrum FOTO, Berg, reassess HEP    Consulted and Agree with Plan of Care Patient           Patient will benefit from skilled therapeutic intervention in order to improve the following deficits and impairments:  Abnormal gait, Hypomobility, Decreased activity tolerance, Decreased strength, Increased fascial restricitons, Pain, Decreased balance, Difficulty walking, Increased muscle spasms, Improper body mechanics, Decreased range of motion, Impaired flexibility, Postural dysfunction  Visit Diagnosis: Obstetric damage to pelvic joints and ligaments  Muscle weakness (generalized)  Difficulty in walking, not elsewhere classified  Other symptoms and signs involving the musculoskeletal system  Unsteadiness on feet     Problem List Patient Active Problem List   Diagnosis Date Noted  . Seasonal allergies 03/10/2019  . Vitamin D deficiency 03/10/2019  . CKD (chronic kidney disease) stage 2, GFR 60-89 ml/min 09/08/2018  . Sacroiliitis ([place]) 05/09/2018  . Edema 05/09/2018  . Abdominal pain 08/25/2017  . Chronic migraine 07/26/2017  . Chronic low back pain without sciatica 07/26/2017  . Gait abnormality 07/26/2017  . Pain of left calf 10/25/2014  . History of treatment by mental health professional 10/02/2014  . Atypical chest pain 08/09/2014  . Routine general medical examination at a health care facility 08/09/2014  . Constipation, postoperative 11/19/2013  . Pelvic pain in female 11/17/2013  . Back pain 02/03/2012    Janene Harvey, PT, DPT 05/23/20 11:40 AM   [place], [place], [address] Phone: [phone]   Fax:  [phone]  Name: Samariyah Cowles MRN: [id number] Date of Birth: February 16, 1971

## 2020-05-27 ENCOUNTER — Ambulatory Visit: Payer: 59

## 2020-05-28 ENCOUNTER — Encounter: Payer: Self-pay | Admitting: Physical Therapy

## 2020-05-28 ENCOUNTER — Other Ambulatory Visit: Payer: Self-pay

## 2020-05-28 ENCOUNTER — Ambulatory Visit: Payer: 59 | Attending: Family Medicine | Admitting: Physical Therapy

## 2020-05-28 DIAGNOSIS — R262 Difficulty in walking, not elsewhere classified: Secondary | ICD-10-CM | POA: Insufficient documentation

## 2020-05-28 DIAGNOSIS — R2681 Unsteadiness on feet: Secondary | ICD-10-CM | POA: Diagnosis present

## 2020-05-28 DIAGNOSIS — M6281 Muscle weakness (generalized): Secondary | ICD-10-CM | POA: Diagnosis present

## 2020-05-28 DIAGNOSIS — R29898 Other symptoms and signs involving the musculoskeletal system: Secondary | ICD-10-CM | POA: Insufficient documentation

## 2020-05-28 NOTE — Therapy (Signed)
[facility] [address]  [place], [place], [address] Phone: [phone]   Fax:  [phone]  Physical Therapy Treatment  Patient Details  Name: Megan Gonzalez MRN: [id number] Date of Birth: 01/29/1971 Referring Provider (PT): Riki Sheer, DO   Encounter Date: 05/28/2020   PT End of Session - 05/28/20 1405    Visit Number 8    Number of Visits 17    Date for PT Re-Evaluation 06/12/20    Authorization Type UHC & Medicare    PT Start Time 6962    PT Stop Time 1459    PT Time Calculation (min) 54 min    Activity Tolerance Patient tolerated treatment well    Behavior During Therapy [facility] for tasks assessed/performed           Past Medical History:  Diagnosis Date  . Anemia   . Anxiety   . Arthritis    chronic low back pain  . Cancer ([place])    cervical cancer; no more uterus  . Depression   . Fecal incontinence   . Headache(784.0)   . Numbness   . Overactive bladder   . Sleep apnea    CPAP-non compliant  . Tremor     Past Surgical History:  Procedure Laterality Date  . ABDOMINAL HYSTERECTOMY    . APPENDECTOMY  08/25/2017  . bladder neurostimulator    . COLONOSCOPY  1999  . DILATION AND CURETTAGE OF UTERUS    . FOOT SURGERY  2006   left  . INCISION AND DRAINAGE ABSCESS N/A 11/19/2013   Procedure: POSTERIOR CULPOTOMY (INCISION AND DRAINAGE OF POST OPERATIVE CUFF HEMATOMA);  Surgeon: Jonnie Kind, MD;  Location: [facility];  Service: Gynecology;  Laterality: N/A;  . LAPAROSCOPIC APPENDECTOMY N/A 08/26/2017   Procedure: APPENDECTOMY LAPAROSCOPIC;  Surgeon: Jackolyn Confer, MD;  Location: [facility];  Service: General;  Laterality: N/A;  . PUBOVAGINAL SLING  03/24/2012   Procedure: Gaynelle Arabian;  Surgeon: Ailene Rud, MD;  Location: [facility];  Service: Urology;  Laterality: N/A;  lynx with Chemical engineer   . REMOVAL OF URINARY SLING    . SVD     x 3  . TONSILLECTOMY  as child  .  URETERAL EXPLORATION  5./30/13  . VAGINAL HYSTERECTOMY N/A 1/2/015   [employer], Dr Linus Mako and Dr Leory Plowman, with release of Pubovag sling    There were no vitals filed for this visit.   Subjective Assessment - 05/28/20 1408    Subjective Pt reporting "not so much" pain today - no longer having the "squeezing" pain. Reports she did not feel well following last session - overall achy, feverish feeling which caused her to spend the rest of the day in bed but woke up feeling ok the next day.    Pertinent History tremor, HA, depression, cervical CA, aniety, anemia, bladder neurostimulator, L foot surgery 2006    Diagnostic tests none recent    Patient Stated Goals improve pain    Currently in Pain? Yes    Pain Score 5     Pain Location Sacrum    Pain Orientation Right    Pain Descriptors / Indicators Aching    Pain Type Chronic pain    Pain Frequency Intermittent                             [facility] Adult PT Treatment/Exercise - 05/28/20 1405  Exercises   Exercises Lumbar      Lumbar Exercises: Aerobic   Nustep L4 x 6 min (UEs/LEs)      Lumbar Exercises: Seated   Other Seated Lumbar Exercises isometric ab set with beach ball in lap 10x5"   cues to avoid rounding spine     Lumbar Exercises: Supine   Bridge 10 reps;5 seconds    Bridge Limitations + green TB hip ABD isometric      Manual Therapy   Manual Therapy Soft tissue mobilization;Myofascial release    Manual therapy comments skilled palpation and monitoring during dry needling    Soft tissue mobilization STM to R glutes & piriformis, TFL, iliacus    Myofascial Release manual TPR to R piriformis and proximal glutes following DN             Trigger Point Dry Needling - 05/28/20 1405    Consent Given? Yes    Muscles Treated Back/Hip Gluteus minimus;Gluteus medius;Gluteus maximus;Piriformis;Tensor fascia lata;Iliacus   Rt   Electrical Stimulation Performed with Dry Needling Yes    E-stim with Dry  Needling Details Rt glutes & piriformis    Gluteus Minimus Response Palpable increased muscle length;Twitch response elicited    Gluteus Medius Response Palpable increased muscle length;Twitch response elicited    Gluteus Maximus Response Palpable increased muscle length;Twitch response elicited    Piriformis Response Palpable increased muscle length    Tensor Fascia Lata Response Palpable increased muscle length;Twitch response elicited    Iliacus Response Palpable increased muscle length;Twitch response elicited                  PT Short Term Goals - 05/13/20 1540      PT SHORT TERM GOAL #1   Title Patient to be independent with initial HEP.    Time 3    Period Weeks    Status Achieved    Target Date 05/08/20             PT Long Term Goals - 04/25/20 0956      PT LONG TERM GOAL #1   Title Patient to be independent with advanced HEP.    Time 8    Period Weeks    Status On-going      PT LONG TERM GOAL #2   Title Patient to demonstrate B LE strength >/=4+/5.    Time 8    Period Weeks    Status On-going      PT LONG TERM GOAL #3   Title Patient to demonstrate lumbar AROM WFL and without pain limiting.    Time 8    Period Weeks    Status On-going      PT LONG TERM GOAL #4   Title Patient to report tolerance for 1 hour of upright sitting without use of additional cushion without pain limiting.    Time 8    Period Weeks    Status On-going      PT LONG TERM GOAL #5   Title Patient to score >45/56 on Berg to decrease risk of falls.    Time 8    Period Weeks    Status On-going      PT LONG TERM GOAL #6   Title Patient to score <12 sec on 5xSTS without use of UEs in order to decrease risk of falls.    Time 8    Period Weeks    Status On-going  Plan - 05/28/20 1414    Clinical Impression Statement Megan Gonzalez continues to note benefit from DN and reports reduction in overall pain with resolution of "squeezing" pain, now noting more of a  generalized achy pain. She has been able to tolerate progression of HEP but still notes difficulty activating abdominal muscles at times. Several strong twitch responses elicited with DN today with associated decrease in muscle tension and pt noting less stiffness by end of session although still requesting moist heat pack before leaving.    Comorbidities tremor, HA, depression, cervical CA, aniety, anemia, bladder neurostimulator, L foot surgery 2006    Rehab Potential Good    PT Frequency 2x / week    PT Duration 8 weeks    PT Treatment/Interventions ADLs/Self Care Home Management;Cryotherapy;Moist Heat;Balance training;Therapeutic exercise;Therapeutic activities;Functional mobility training;Stair training;Gait training;DME Instruction;Neuromuscular re-education;Patient/family education;Manual techniques;Taping;Energy conservation;Dry needling;Passive range of motion    PT Next Visit Plan lumbopelvic flexibilty and stabilization/strengthening; HEP review/update as indicated; Berg?    Consulted and Agree with Plan of Care Patient           Patient will benefit from skilled therapeutic intervention in order to improve the following deficits and impairments:  Abnormal gait, Hypomobility, Decreased activity tolerance, Decreased strength, Increased fascial restricitons, Pain, Decreased balance, Difficulty walking, Increased muscle spasms, Improper body mechanics, Decreased range of motion, Impaired flexibility, Postural dysfunction  Visit Diagnosis: Obstetric damage to pelvic joints and ligaments  Muscle weakness (generalized)  Difficulty in walking, not elsewhere classified  Other symptoms and signs involving the musculoskeletal system  Unsteadiness on feet     Problem List Patient Active Problem List   Diagnosis Date Noted  . Seasonal allergies 03/10/2019  . Vitamin D deficiency 03/10/2019  . CKD (chronic kidney disease) stage 2, GFR 60-89 ml/min 09/08/2018  . Sacroiliitis ([place])  05/09/2018  . Edema 05/09/2018  . Abdominal pain 08/25/2017  . Chronic migraine 07/26/2017  . Chronic low back pain without sciatica 07/26/2017  . Gait abnormality 07/26/2017  . Pain of left calf 10/25/2014  . History of treatment by mental health professional 10/02/2014  . Atypical chest pain 08/09/2014  . Routine general medical examination at a health care facility 08/09/2014  . Constipation, postoperative 11/19/2013  . Pelvic pain in female 11/17/2013  . Back pain 02/03/2012    Percival Spanish, PT, MPT 05/28/2020, 4:06 PM  [facility] [address]  [place], [place], [address] Phone: [phone]   Fax:  [phone]  Name: Megan Gonzalez MRN: [id number] Date of Birth: December 03, 1970

## 2020-05-30 ENCOUNTER — Encounter: Payer: Self-pay | Admitting: Physical Therapy

## 2020-05-30 ENCOUNTER — Ambulatory Visit: Payer: 59 | Admitting: Physical Therapy

## 2020-05-30 ENCOUNTER — Other Ambulatory Visit: Payer: Self-pay

## 2020-05-30 DIAGNOSIS — R2681 Unsteadiness on feet: Secondary | ICD-10-CM

## 2020-05-30 DIAGNOSIS — R262 Difficulty in walking, not elsewhere classified: Secondary | ICD-10-CM

## 2020-05-30 DIAGNOSIS — R29898 Other symptoms and signs involving the musculoskeletal system: Secondary | ICD-10-CM

## 2020-05-30 DIAGNOSIS — M6281 Muscle weakness (generalized): Secondary | ICD-10-CM

## 2020-05-30 NOTE — Therapy (Signed)
[facility] [address]  [place], [place], [address] Phone: [phone]   Fax:  [phone]  Physical Therapy Progress Note  Patient Details  Name: Megan Gonzalez MRN: [id number] Date of Birth: [birth year] Referring Provider (PT): Riki Sheer, DO   Progress Note Reporting Period 04/17/20 to 05/30/20  See note below for Objective Data and Assessment of Progress/Goals.     Encounter Date: 05/30/2020   PT End of Session - 05/30/20 1142    Visit Number 9    Number of Visits 21    Date for PT Re-Evaluation 07/11/20    Authorization Type UHC & Medicare    PT Start Time 0854   pt late   PT Stop Time 0927    PT Time Calculation (min) 33 min    Activity Tolerance Patient tolerated treatment well    Behavior During Therapy [facility] for tasks assessed/performed           Past Medical History:  Diagnosis Date  . Anemia   . Anxiety   . Arthritis    chronic low back pain  . Cancer ([place])    cervical cancer; no more uterus  . Depression   . Fecal incontinence   . Headache(784.0)   . Numbness   . Overactive bladder   . Sleep apnea    CPAP-non compliant  . Tremor     Past Surgical History:  Procedure Laterality Date  . ABDOMINAL HYSTERECTOMY    . APPENDECTOMY  08/25/2017  . bladder neurostimulator    . COLONOSCOPY  1999  . DILATION AND CURETTAGE OF UTERUS    . FOOT SURGERY  2006   left  . INCISION AND DRAINAGE ABSCESS N/A 11/19/2013   Procedure: POSTERIOR CULPOTOMY (INCISION AND DRAINAGE OF POST OPERATIVE CUFF HEMATOMA);  Surgeon: Jonnie Kind, MD;  Location: [facility];  Service: Gynecology;  Laterality: N/A;  . LAPAROSCOPIC APPENDECTOMY N/A 08/26/2017   Procedure: APPENDECTOMY LAPAROSCOPIC;  Surgeon: Jackolyn Confer, MD;  Location: [facility];  Service: General;  Laterality: N/A;  . PUBOVAGINAL SLING  03/24/2012   Procedure: Gaynelle Arabian;  Surgeon: Ailene Rud, MD;  Location: [facility];   Service: Urology;  Laterality: N/A;  lynx with Chemical engineer   . REMOVAL OF URINARY SLING    . SVD     x 3  . TONSILLECTOMY  as child  . URETERAL EXPLORATION  5./30/13  . VAGINAL HYSTERECTOMY N/A 1/2/015   [employer], Dr Linus Mako and Dr Leory Plowman, with release of Pubovag sling    There were no vitals filed for this visit.   Subjective Assessment - 05/30/20 0854    Subjective Doing "so-so." Has been feeling less achey, stronger, and more flexibile. ALso notes improvemen tin mobility and transfers. Reports overall 65% improvement with therapy.    Pertinent History tremor, HA, depression, cervical CA, aniety, anemia, bladder neurostimulator, L foot surgery 2006    Diagnostic tests none recent    Patient Stated Goals improve pain    Currently in Pain? Yes    Pain Score 4     Pain Location Sacrum    Pain Orientation Right    Pain Descriptors / Indicators Aching    Pain Type Chronic pain              [facility] PT Assessment - 05/30/20 0001      Assessment   Medical Diagnosis Sacroiliitis    Referring Provider (PT) Riki Sheer, DO    Onset Date/Surgical  Date --   2011     AROM   Lumbar Flexion mid shin    Lumbar Extension mod-severely limited    Lumbar - Right Side Bend distal thigh    Lumbar - Left Side Bend jt line    Lumbar - Right Rotation mildly limited    Lumbar - Left Rotation mildly limited      Strength   Right Hip Flexion 4+/5    Right Hip External Rotation  4+/5    Right Hip Internal Rotation 4+/5    Right Hip ABduction 4/5    Right Hip ADduction 4/5    Left Hip Flexion 4+/5    Left Hip External Rotation 4/5    Left Hip Internal Rotation 4/5    Left Hip ABduction 4/5    Left Hip ADduction 4/5    Right Knee Flexion 4+/5    Right Knee Extension 4+/5    Left Knee Flexion 4-/5    Left Knee Extension 4+/5    Right Ankle Dorsiflexion 4+/5    Right Ankle Plantar Flexion 4/5    Left Ankle Dorsiflexion 4+/5    Left Ankle Plantar Flexion 4/5       Standardized Balance Assessment   Standardized Balance Assessment Five Times Sit to Stand;Berg Balance Test    Five times sit to stand comments  trial 1: 39, 2 sec with B UEs on thighs, no cushion      Berg Balance Test   Sit to Stand Able to stand  independently using hands    Standing Unsupported Able to stand safely 2 minutes    Sitting with Back Unsupported but Feet Supported on Floor or Stool Able to sit safely and securely 2 minutes    Stand to Sit Controls descent by using hands    Transfers Able to transfer safely, definite need of hands    Standing Unsupported with Eyes Closed Able to stand 10 seconds with supervision    Standing Unsupported with Feet Together Able to place feet together independently and stand 1 minute safely    From Standing, Reach Forward with Outstretched Arm Can reach forward >12 cm safely (5")    From Standing Position, Pick up Object from Floor Able to pick up shoe, needs supervision    From Standing Position, Turn to Look Behind Over each Shoulder Needs supervision when turning    Turn 360 Degrees Able to turn 360 degrees safely one side only in 4 seconds or less    Standing Unsupported, Alternately Place Feet on Step/Stool Able to stand independently and complete 8 steps >20 seconds    Standing Unsupported, One Foot in [employer] balance while stepping or standing    Standing on One Leg Able to lift leg independently and hold equal to or more than 3 seconds    Total Score 39                                 PT Education - 05/30/20 1142    Education Details discussion on objecitve progress and remaining impairments    Person(s) Educated Patient    Methods Explanation;Demonstration;Tactile cues;Handout;Verbal cues    Comprehension Verbalized understanding;Returned demonstration            PT Short Term Goals - 05/30/20 1143      PT SHORT TERM GOAL #1   Title Patient to be independent with initial HEP.    Time 3  Period  Weeks    Status Achieved    Target Date 05/08/20             PT Long Term Goals - 05/30/20 1144      PT LONG TERM GOAL #1   Title Patient to be independent with advanced HEP.    Time 6    Period Weeks    Status Partially Met   met for current   Target Date 07/11/20      PT LONG TERM GOAL #2   Title Patient to demonstrate B LE strength >/=4+/5.    Time 6    Period Weeks    Status Partially Met   improvement in B hip flexion, abduction, adduction, ankle plantarflexion, R hip ER, and R knee flexion   Target Date 07/11/20      PT LONG TERM GOAL #3   Title Patient to demonstrate lumbar AROM WFL and without pain limiting.    Time 6    Period Weeks    Status Partially Met   no pain, but still with limitations most in flexion and extension   Target Date 07/11/20      PT LONG TERM GOAL #4   Title Patient to report tolerance for 1 hour of upright sitting without use of additional cushion without pain limiting.    Time 6    Period Weeks    Status On-going   notes tolerance for 10 minutes sitting on cushion, and 5 miniutes without   Target Date 07/11/20      PT LONG TERM GOAL #5   Title Patient to score >45/56 on Berg to decrease risk of falls.    Time 6    Period Weeks    Status On-going   05/30/20: 39/56   Target Date 07/11/20      PT LONG TERM GOAL #6   Title Patient to score <12 sec on 5xSTS without use of UEs in order to decrease risk of falls.    Time 6    Period Weeks    Status On-going   05/30/20: 39.2 sec without cushion and with B UEs on thighs   Target Date 07/11/20                 Plan - 05/30/20 1153    Clinical Impression Statement Patient reports 65% overall improvement with therapy. Noting improvement in pain, strength, flexibility, mobility, and transfers. Strength testing revealed improvement in B hip flexion, abduction, adduction, ankle plantarflexion, R hip ER, and R knee flexion strength. Lumbar AROM is now pain-free and improved in B rotation,  but still with most limitation in flexion and extension. Patient scored 39/56 on Berg, indicating an increased risk of falls. Time on 5xSTS test improved considerably today, demonstrating a good improvement in transfer ability, strength, and mobility. Patient still reports limitation in sitting tolerance, particularly when not sitting on her donut cushion. Overall, patient is demonstrating an excellent improvement towards goals, but with remaining limitations in functional activity tolerance, balance, and pain. Would benefit from continued skilled PT services 2x/week for 6 weeks to address aforementioned impairments.    Comorbidities tremor, HA, depression, cervical CA, aniety, anemia, bladder neurostimulator, L foot surgery 2006    Rehab Potential Good    PT Frequency 2x / week    PT Duration 6 weeks    PT Treatment/Interventions ADLs/Self Care Home Management;Cryotherapy;Moist Heat;Balance training;Therapeutic exercise;Therapeutic activities;Functional mobility training;Stair training;Gait training;DME Instruction;Neuromuscular re-education;Patient/family education;Manual techniques;Taping;Energy conservation;Dry needling;Passive range of motion    PT  Next Visit Plan lumbopelvic ROM (try cat/cow?), R hip and L HS strengthening, balance training without SPC- focus on turning and dynamic SLS activities    Consulted and Agree with Plan of Care Patient           Patient will benefit from skilled therapeutic intervention in order to improve the following deficits and impairments:  Abnormal gait, Hypomobility, Decreased activity tolerance, Decreased strength, Increased fascial restricitons, Pain, Decreased balance, Difficulty walking, Increased muscle spasms, Improper body mechanics, Decreased range of motion, Impaired flexibility, Postural dysfunction  Visit Diagnosis: Obstetric damage to pelvic joints and ligaments  Muscle weakness (generalized)  Difficulty in walking, not elsewhere  classified  Other symptoms and signs involving the musculoskeletal system  Unsteadiness on feet     Problem List Patient Active Problem List   Diagnosis Date Noted  . Seasonal allergies 03/10/2019  . Vitamin D deficiency 03/10/2019  . CKD (chronic kidney disease) stage 2, GFR 60-89 ml/min 09/08/2018  . Sacroiliitis ([place]) 05/09/2018  . Edema 05/09/2018  . Abdominal pain 08/25/2017  . Chronic migraine 07/26/2017  . Chronic low back pain without sciatica 07/26/2017  . Gait abnormality 07/26/2017  . Pain of left calf 10/25/2014  . History of treatment by mental health professional 10/02/2014  . Atypical chest pain 08/09/2014  . Routine general medical examination at a health care facility 08/09/2014  . Constipation, postoperative 11/19/2013  . Pelvic pain in female 11/17/2013  . Back pain 02/03/2012     Janene Harvey, PT, DPT 05/30/20 11:58 AM   [facility] [place], [place], [address] Phone: [phone]   Fax:  [phone]  Name: Megan Gonzalez MRN: [id number] Date of Birth: 1971/09/30

## 2020-06-04 ENCOUNTER — Ambulatory Visit: Payer: 59 | Admitting: Physical Therapy

## 2020-06-04 ENCOUNTER — Other Ambulatory Visit: Payer: Self-pay

## 2020-06-04 ENCOUNTER — Encounter: Payer: Self-pay | Admitting: Physical Therapy

## 2020-06-04 DIAGNOSIS — R262 Difficulty in walking, not elsewhere classified: Secondary | ICD-10-CM

## 2020-06-04 DIAGNOSIS — M6281 Muscle weakness (generalized): Secondary | ICD-10-CM

## 2020-06-04 DIAGNOSIS — R29898 Other symptoms and signs involving the musculoskeletal system: Secondary | ICD-10-CM

## 2020-06-04 DIAGNOSIS — R2681 Unsteadiness on feet: Secondary | ICD-10-CM

## 2020-06-04 NOTE — Therapy (Signed)
[facility] [address]  [place], [place], [address] Phone: [phone]   Fax:  [phone]  Physical Therapy Treatment  Patient Details  Name: Megan Gonzalez MRN: [id number] Date of Birth: [birth year] Referring Provider (PT): Riki Sheer, DO   Encounter Date: 06/04/2020   PT End of Session - 06/04/20 1642    Visit Number 10    Number of Visits 21    Date for PT Re-Evaluation 07/11/20    Authorization Type UHC & Medicare    PT Start Time 1450   pt late   PT Stop Time 1538   moist heat   PT Time Calculation (min) 48 min    Activity Tolerance Patient tolerated treatment well    Behavior During Therapy [facility] for tasks assessed/performed           Past Medical History:  Diagnosis Date  . Anemia   . Anxiety   . Arthritis    chronic low back pain  . Cancer ([place])    cervical cancer; no more uterus  . Depression   . Fecal incontinence   . Headache(784.0)   . Numbness   . Overactive bladder   . Sleep apnea    CPAP-non compliant  . Tremor     Past Surgical History:  Procedure Laterality Date  . ABDOMINAL HYSTERECTOMY    . APPENDECTOMY  08/25/2017  . bladder neurostimulator    . COLONOSCOPY  1999  . DILATION AND CURETTAGE OF UTERUS    . FOOT SURGERY  2006   left  . INCISION AND DRAINAGE ABSCESS N/A 11/19/2013   Procedure: POSTERIOR CULPOTOMY (INCISION AND DRAINAGE OF POST OPERATIVE CUFF HEMATOMA);  Surgeon: Jonnie Kind, MD;  Location: [facility];  Service: Gynecology;  Laterality: N/A;  . LAPAROSCOPIC APPENDECTOMY N/A 08/26/2017   Procedure: APPENDECTOMY LAPAROSCOPIC;  Surgeon: Jackolyn Confer, MD;  Location: [facility];  Service: General;  Laterality: N/A;  . PUBOVAGINAL SLING  03/24/2012   Procedure: Gaynelle Arabian;  Surgeon: Ailene Rud, MD;  Location: [facility];  Service: Urology;  Laterality: N/A;  lynx with Chemical engineer   . REMOVAL OF URINARY SLING    . SVD     x 3  .  TONSILLECTOMY  as child  . URETERAL EXPLORATION  5./30/13  . VAGINAL HYSTERECTOMY N/A 1/2/015   [employer], Dr Linus Mako and Dr Leory Plowman, with release of Pubovag sling    There were no vitals filed for this visit.   Subjective Assessment - 06/04/20 1452    Subjective Been feeling good.    Pertinent History tremor, HA, depression, cervical CA, aniety, anemia, bladder neurostimulator, L foot surgery 2006    Diagnostic tests none recent    Patient Stated Goals improve pain    Currently in Pain? No/denies                             [facility] Adult PT Treatment/Exercise - 06/04/20 0001      Exercises   Exercises Knee/Hip      Lumbar Exercises: Aerobic   Nustep L4 x 5 min (UEs/LEs)      Lumbar Exercises: Seated   Other Seated Lumbar Exercises R and L HS curl with green TB    more difficulty on L LE     Lumbar Exercises: Sidelying   Other Sidelying Lumbar Exercises R and L open book stretch to tolerance 10x    more difficulty to  L     Lumbar Exercises: Quadruped   Madcat/Old Horse 10 reps    Madcat/Old Horse Limitations manual guidance for proper movement   difficulty coordinating movement     Knee/Hip Exercises: Stretches   Other Knee/Hip Stretches R/L KTOS in sitting 30":      Knee/Hip Exercises: Standing   Hip Abduction Stengthening;Right;Left;1 set;10 reps;Knee straight    Abduction Limitations at counter; cues to avoid lateral trunk lean    Hip Extension Stengthening;Right;Left;1 set;10 reps;Knee straight    Extension Limitations at counter; cues to avoid anterior trunk lean      Moist Heat Therapy   Number Minutes Moist Heat 10 Minutes    Moist Heat Location Lumbar Spine                  PT Education - 06/04/20 1641    Education Details update to HEP    Person(s) Educated Patient    Methods Explanation;Demonstration;Tactile cues;Verbal cues;Handout    Comprehension Verbalized understanding;Returned demonstration            PT Short  Term Goals - 05/30/20 1143      PT SHORT TERM GOAL #1   Title Patient to be independent with initial HEP.    Time 3    Period Weeks    Status Achieved    Target Date 05/08/20             PT Long Term Goals - 05/30/20 1144      PT LONG TERM GOAL #1   Title Patient to be independent with advanced HEP.    Time 6    Period Weeks    Status Partially Met   met for current   Target Date 07/11/20      PT LONG TERM GOAL #2   Title Patient to demonstrate B LE strength >/=4+/5.    Time 6    Period Weeks    Status Partially Met   improvement in B hip flexion, abduction, adduction, ankle plantarflexion, R hip ER, and R knee flexion   Target Date 07/11/20      PT LONG TERM GOAL #3   Title Patient to demonstrate lumbar AROM WFL and without pain limiting.    Time 6    Period Weeks    Status Partially Met   no pain, but still with limitations most in flexion and extension   Target Date 07/11/20      PT LONG TERM GOAL #4   Title Patient to report tolerance for 1 hour of upright sitting without use of additional cushion without pain limiting.    Time 6    Period Weeks    Status On-going   notes tolerance for 10 minutes sitting on cushion, and 5 miniutes without   Target Date 07/11/20      PT LONG TERM GOAL #5   Title Patient to score >45/56 on Berg to decrease risk of falls.    Time 6    Period Weeks    Status On-going   05/30/20: 39/56   Target Date 07/11/20      PT LONG TERM GOAL #6   Title Patient to score <12 sec on 5xSTS without use of UEs in order to decrease risk of falls.    Time 6    Period Weeks    Status On-going   05/30/20: 39.2 sec without cushion and with B UEs on thighs   Target Date 07/11/20  Plan - 06/04/20 1642    Clinical Impression Statement Patient reporting "I've been feeling good" and reporting no pain at start of session. Began session working on lumbopelvic and thoracolumbar mobility to address patient's remaining ROM  limitations. Patient tolerated cat/cow with manual cueing d/t difficulty coordinating movement but overall, with good tolerance of this exercise. Open book stretch was performed with more difficulty to L vs. R side. Progressed LE strengthening ther-ex to address remaining strength impairments with good effort. Manual cueing provided at B shoulders with standing hip abduction to avoid lateral trunk lean. Ended session with moist heat to LB for post-exercise soreness. Patient without complaints at end of session.    Comorbidities tremor, HA, depression, cervical CA, aniety, anemia, bladder neurostimulator, L foot surgery 2006    Rehab Potential Good    PT Frequency 2x / week    PT Duration 6 weeks    PT Treatment/Interventions ADLs/Self Care Home Management;Cryotherapy;Moist Heat;Balance training;Therapeutic exercise;Therapeutic activities;Functional mobility training;Stair training;Gait training;DME Instruction;Neuromuscular re-education;Patient/family education;Manual techniques;Taping;Energy conservation;Dry needling;Passive range of motion    PT Next Visit Plan lumbopelvic ROM, R hip and L HS strengthening, balance training without SPC- focus on turning and dynamic SLS activities    Consulted and Agree with Plan of Care Patient           Patient will benefit from skilled therapeutic intervention in order to improve the following deficits and impairments:  Abnormal gait, Hypomobility, Decreased activity tolerance, Decreased strength, Increased fascial restricitons, Pain, Decreased balance, Difficulty walking, Increased muscle spasms, Improper body mechanics, Decreased range of motion, Impaired flexibility, Postural dysfunction  Visit Diagnosis: Obstetric damage to pelvic joints and ligaments  Muscle weakness (generalized)  Difficulty in walking, not elsewhere classified  Other symptoms and signs involving the musculoskeletal system  Unsteadiness on feet     Problem List Patient Active  Problem List   Diagnosis Date Noted  . Seasonal allergies 03/10/2019  . Vitamin D deficiency 03/10/2019  . CKD (chronic kidney disease) stage 2, GFR 60-89 ml/min 09/08/2018  . Sacroiliitis ([place]) 05/09/2018  . Edema 05/09/2018  . Abdominal pain 08/25/2017  . Chronic migraine 07/26/2017  . Chronic low back pain without sciatica 07/26/2017  . Gait abnormality 07/26/2017  . Pain of left calf 10/25/2014  . History of treatment by mental health professional 10/02/2014  . Atypical chest pain 08/09/2014  . Routine general medical examination at a health care facility 08/09/2014  . Constipation, postoperative 11/19/2013  . Pelvic pain in female 11/17/2013  . Back pain 02/03/2012     Janene Harvey, PT, DPT 06/04/20 4:45 PM   [facility] [address]  [place], [place], [address] Phone: [phone]   Fax:  [phone]  Name: Megan Gonzalez MRN: [id number] Date of Birth: May 01, 1971

## 2020-06-06 ENCOUNTER — Other Ambulatory Visit: Payer: Self-pay

## 2020-06-06 ENCOUNTER — Ambulatory Visit: Payer: 59

## 2020-06-06 DIAGNOSIS — R262 Difficulty in walking, not elsewhere classified: Secondary | ICD-10-CM

## 2020-06-06 DIAGNOSIS — R29898 Other symptoms and signs involving the musculoskeletal system: Secondary | ICD-10-CM

## 2020-06-06 DIAGNOSIS — R2681 Unsteadiness on feet: Secondary | ICD-10-CM

## 2020-06-06 DIAGNOSIS — M6281 Muscle weakness (generalized): Secondary | ICD-10-CM

## 2020-06-06 NOTE — Therapy (Signed)
[facility] [address]  [place], [place], [address] Phone: [phone]   Fax:  [phone]  Physical Therapy Treatment  Patient Details  Name: Megan Gonzalez MRN: [id number] Date of Birth: [birth year] Referring Provider (PT): Riki Sheer, DO   Encounter Date: 06/06/2020   PT End of Session - 06/06/20 0900    Visit Number 11    Number of Visits 21    Date for PT Re-Evaluation 07/11/20    Authorization Type UHC & Medicare    PT Start Time 0853   Pt. arrived late   PT Stop Time 0928    PT Time Calculation (min) 35 min    Activity Tolerance Patient tolerated treatment well    Behavior During Therapy [facility] for tasks assessed/performed           Past Medical History:  Diagnosis Date  . Anemia   . Anxiety   . Arthritis    chronic low back pain  . Cancer ([place])    cervical cancer; no more uterus  . Depression   . Fecal incontinence   . Headache(784.0)   . Numbness   . Overactive bladder   . Sleep apnea    CPAP-non compliant  . Tremor     Past Surgical History:  Procedure Laterality Date  . ABDOMINAL HYSTERECTOMY    . APPENDECTOMY  08/25/2017  . bladder neurostimulator    . COLONOSCOPY  1999  . DILATION AND CURETTAGE OF UTERUS    . FOOT SURGERY  2006   left  . INCISION AND DRAINAGE ABSCESS N/A 11/19/2013   Procedure: POSTERIOR CULPOTOMY (INCISION AND DRAINAGE OF POST OPERATIVE CUFF HEMATOMA);  Surgeon: Jonnie Kind, MD;  Location: [facility];  Service: Gynecology;  Laterality: N/A;  . LAPAROSCOPIC APPENDECTOMY N/A 08/26/2017   Procedure: APPENDECTOMY LAPAROSCOPIC;  Surgeon: Jackolyn Confer, MD;  Location: [facility];  Service: General;  Laterality: N/A;  . PUBOVAGINAL SLING  03/24/2012   Procedure: Gaynelle Arabian;  Surgeon: Ailene Rud, MD;  Location: [facility];  Service: Urology;  Laterality: N/A;  lynx with Chemical engineer   . REMOVAL OF URINARY SLING    . SVD     x 3  .  TONSILLECTOMY  as child  . URETERAL EXPLORATION  5./30/13  . VAGINAL HYSTERECTOMY N/A 1/2/015   [employer], Dr Linus Mako and Dr Leory Plowman, with release of Pubovag sling    There were no vitals filed for this visit.   Subjective Assessment - 06/06/20 0859    Subjective Pt. noting she has been feeling better.    Pertinent History tremor, HA, depression, cervical CA, aniety, anemia, bladder neurostimulator, L foot surgery 2006    Diagnostic tests none recent    Patient Stated Goals improve pain    Currently in Pain? Yes    Pain Score 5     Pain Location Hip    Pain Orientation Right;Lateral;Anterior    Pain Descriptors / Indicators Aching    Pain Type Chronic pain    Pain Frequency Intermittent    Multiple Pain Sites No              [facility] PT Assessment - 06/06/20 0001      Assessment   Medical Diagnosis Sacroiliitis    Referring Provider (PT) Riki Sheer, DO    Hand Dominance Right    Next MD Visit 10/04/20    Prior Therapy yes- pelvic health PT  [facility] Adult PT Treatment/Exercise - 06/06/20 0001      Lumbar Exercises: Aerobic   Nustep L4 x 6 min (UEs/LEs)      Lumbar Exercises: Seated   Sit to Stand 10 reps    Sit to Stand Limitations B UE pushoff from knees     Other Seated Lumbar Exercises L green TB HS curl x 10      Knee/Hip Exercises: Stretches   Piriformis Stretch Right;Left;1 rep;30 seconds    Piriformis Stretch Limitations Sitting Figure-4    Other Knee/Hip Stretches R/L KTOS in sitting 30":      Knee/Hip Exercises: Standing   Hip Abduction Stengthening;Right;Left;1 set;10 reps;Knee straight    Abduction Limitations 2 ski pole support     Hip Extension Stengthening;Right;Left;1 set;10 reps;Knee straight    Extension Limitations 2 ski pole support                     PT Short Term Goals - 05/30/20 1143      PT SHORT TERM GOAL #1   Title Patient to be independent with initial HEP.    Time 3     Period Weeks    Status Achieved    Target Date 05/08/20             PT Long Term Goals - 05/30/20 1144      PT LONG TERM GOAL #1   Title Patient to be independent with advanced HEP.    Time 6    Period Weeks    Status Partially Met   met for current   Target Date 07/11/20      PT LONG TERM GOAL #2   Title Patient to demonstrate B LE strength >/=4+/5.    Time 6    Period Weeks    Status Partially Met   improvement in B hip flexion, abduction, adduction, ankle plantarflexion, R hip ER, and R knee flexion   Target Date 07/11/20      PT LONG TERM GOAL #3   Title Patient to demonstrate lumbar AROM WFL and without pain limiting.    Time 6    Period Weeks    Status Partially Met   no pain, but still with limitations most in flexion and extension   Target Date 07/11/20      PT LONG TERM GOAL #4   Title Patient to report tolerance for 1 hour of upright sitting without use of additional cushion without pain limiting.    Time 6    Period Weeks    Status On-going   notes tolerance for 10 minutes sitting on cushion, and 5 miniutes without   Target Date 07/11/20      PT LONG TERM GOAL #5   Title Patient to score >45/56 on Berg to decrease risk of falls.    Time 6    Period Weeks    Status On-going   05/30/20: 39/56   Target Date 07/11/20      PT LONG TERM GOAL #6   Title Patient to score <12 sec on 5xSTS without use of UEs in order to decrease risk of falls.    Time 6    Period Weeks    Status On-going   05/30/20: 39.2 sec without cushion and with B UEs on thighs   Target Date 07/11/20                 Plan - 06/06/20 0904    Clinical Impression Statement Pt. arrived late to  session thus tx time limited.  Shortened session focused on lumbopelvic strengthening and ROM activities to pt. tolerance.  Pt. reporting improved pain levels since starting therapy.  Pain well managed with therex today and ended visit with pain returning to baseline.    Comorbidities tremor, HA,  depression, cervical CA, aniety, anemia, bladder neurostimulator, L foot surgery 2006    Rehab Potential Good    PT Frequency 2x / week    PT Duration 6 weeks    PT Treatment/Interventions ADLs/Self Care Home Management;Cryotherapy;Moist Heat;Balance training;Therapeutic exercise;Therapeutic activities;Functional mobility training;Stair training;Gait training;DME Instruction;Neuromuscular re-education;Patient/family education;Manual techniques;Taping;Energy conservation;Dry needling;Passive range of motion    PT Next Visit Plan lumbopelvic ROM, R hip and L HS strengthening, balance training without SPC- focus on turning and dynamic SLS activities    Consulted and Agree with Plan of Care Patient           Patient will benefit from skilled therapeutic intervention in order to improve the following deficits and impairments:  Abnormal gait, Hypomobility, Decreased activity tolerance, Decreased strength, Increased fascial restricitons, Pain, Decreased balance, Difficulty walking, Increased muscle spasms, Improper body mechanics, Decreased range of motion, Impaired flexibility, Postural dysfunction  Visit Diagnosis: Obstetric damage to pelvic joints and ligaments  Muscle weakness (generalized)  Difficulty in walking, not elsewhere classified  Other symptoms and signs involving the musculoskeletal system  Unsteadiness on feet     Problem List Patient Active Problem List   Diagnosis Date Noted  . Seasonal allergies 03/10/2019  . Vitamin D deficiency 03/10/2019  . CKD (chronic kidney disease) stage 2, GFR 60-89 ml/min 09/08/2018  . Sacroiliitis ([place]) 05/09/2018  . Edema 05/09/2018  . Abdominal pain 08/25/2017  . Chronic migraine 07/26/2017  . Chronic low back pain without sciatica 07/26/2017  . Gait abnormality 07/26/2017  . Pain of left calf 10/25/2014  . History of treatment by mental health professional 10/02/2014  . Atypical chest pain 08/09/2014  . Routine general medical  examination at a health care facility 08/09/2014  . Constipation, postoperative 11/19/2013  . Pelvic pain in female 11/17/2013  . Back pain 02/03/2012    Bess Harvest, PTA 06/06/20 1:54 PM   [facility] [address]  [place], [place], [address] Phone: [phone]   Fax:  [phone]  Name: Megan Gonzalez MRN: [id number] Date of Birth: 1971-05-03

## 2020-06-11 ENCOUNTER — Ambulatory Visit: Payer: 59 | Admitting: Physical Therapy

## 2020-06-11 ENCOUNTER — Encounter: Payer: Self-pay | Admitting: Physical Therapy

## 2020-06-11 ENCOUNTER — Other Ambulatory Visit: Payer: Self-pay

## 2020-06-11 DIAGNOSIS — R29898 Other symptoms and signs involving the musculoskeletal system: Secondary | ICD-10-CM

## 2020-06-11 DIAGNOSIS — M6281 Muscle weakness (generalized): Secondary | ICD-10-CM

## 2020-06-11 DIAGNOSIS — R2681 Unsteadiness on feet: Secondary | ICD-10-CM

## 2020-06-11 DIAGNOSIS — R262 Difficulty in walking, not elsewhere classified: Secondary | ICD-10-CM

## 2020-06-11 NOTE — Therapy (Signed)
[facility] [address]  [place], [place], [address] Phone: [phone]   Fax:  [phone]  Physical Therapy Treatment  Patient Details  Name: Megan Gonzalez MRN: [id number] Date of Birth: 18-Dec-1970 Referring Provider (PT): Riki Sheer, DO   Encounter Date: 06/11/2020   PT End of Session - 06/11/20 0851    Visit Number 12    Number of Visits 21    Date for PT Re-Evaluation 07/11/20    Authorization Type UHC & Medicare    PT Start Time 0851    PT Stop Time 0937    PT Time Calculation (min) 46 min    Activity Tolerance Patient tolerated treatment well    Behavior During Therapy [facility] for tasks assessed/performed           Past Medical History:  Diagnosis Date  . Anemia   . Anxiety   . Arthritis    chronic low back pain  . Cancer ([place])    cervical cancer; no more uterus  . Depression   . Fecal incontinence   . Headache(784.0)   . Numbness   . Overactive bladder   . Sleep apnea    CPAP-non compliant  . Tremor     Past Surgical History:  Procedure Laterality Date  . ABDOMINAL HYSTERECTOMY    . APPENDECTOMY  08/25/2017  . bladder neurostimulator    . COLONOSCOPY  1999  . DILATION AND CURETTAGE OF UTERUS    . FOOT SURGERY  2006   left  . INCISION AND DRAINAGE ABSCESS N/A 11/19/2013   Procedure: POSTERIOR CULPOTOMY (INCISION AND DRAINAGE OF POST OPERATIVE CUFF HEMATOMA);  Surgeon: Jonnie Kind, MD;  Location: [facility];  Service: Gynecology;  Laterality: N/A;  . LAPAROSCOPIC APPENDECTOMY N/A 08/26/2017   Procedure: APPENDECTOMY LAPAROSCOPIC;  Surgeon: Jackolyn Confer, MD;  Location: [facility];  Service: General;  Laterality: N/A;  . PUBOVAGINAL SLING  03/24/2012   Procedure: Gaynelle Arabian;  Surgeon: Ailene Rud, MD;  Location: [facility];  Service: Urology;  Laterality: N/A;  lynx with Chemical engineer   . REMOVAL OF URINARY SLING    . SVD     x 3  . TONSILLECTOMY  as child  .  URETERAL EXPLORATION  5./30/13  . VAGINAL HYSTERECTOMY N/A 1/2/015   [employer], Dr Linus Mako and Dr Leory Plowman, with release of Pubovag sling    There were no vitals filed for this visit.   Subjective Assessment - 06/11/20 0855    Subjective The last exercises that Megan Gonzalez gave feel like they are making a difference.    Pertinent History tremor, HA, depression, cervical CA, aniety, anemia, bladder neurostimulator, L foot surgery 2006    Diagnostic tests none recent    Patient Stated Goals improve pain    Currently in Pain? Yes    Pain Score 3    3-4/10   Pain Location Buttocks    Pain Orientation Right;Posterior    Pain Descriptors / Indicators Stabbing;Squeezing    Pain Type Chronic pain    Pain Frequency Intermittent                             [facility] Adult PT Treatment/Exercise - 06/11/20 0851      Lumbar Exercises: Aerobic   Recumbent Bike L1 x 6 min      Lumbar Exercises: Sidelying   Other Sidelying Lumbar Exercises R and L open book stretch to tolerance  10x       Lumbar Exercises: Quadruped   Madcat/Old Horse 10 reps    Madcat/Old Horse Limitations better movement pattern today    Straight Leg Raise 10 reps;2 seconds    Straight Leg Raises Limitations 5 reps each with knee bent and straight - better form with knee straight      Moist Heat Therapy   Number Minutes Moist Heat 5 Minutes    Moist Heat Location Lumbar Spine   & buttocks     Manual Therapy   Manual Therapy Soft tissue mobilization;Myofascial release    Manual therapy comments skilled palpation and monitoring during dry needling    Soft tissue mobilization STM to R glutes & piriformis    Myofascial Release manual TPR to R proximal glutes following DN             Trigger Point Dry Needling - 06/11/20 0851    Consent Given? Yes    Muscles Treated Back/Hip Gluteus minimus;Gluteus medius;Gluteus maximus   Rt   Gluteus Minimus Response Palpable increased muscle length;Twitch response  elicited    Gluteus Medius Response Palpable increased muscle length;Twitch response elicited    Gluteus Maximus Response Palpable increased muscle length;Twitch response elicited                  PT Short Term Goals - 05/30/20 1143      PT SHORT TERM GOAL #1   Title Patient to be independent with initial HEP.    Time 3    Period Weeks    Status Achieved    Target Date 05/08/20             PT Long Term Goals - 05/30/20 1144      PT LONG TERM GOAL #1   Title Patient to be independent with advanced HEP.    Time 6    Period Weeks    Status Partially Met   met for current   Target Date 07/11/20      PT LONG TERM GOAL #2   Title Patient to demonstrate B LE strength >/=4+/5.    Time 6    Period Weeks    Status Partially Met   improvement in B hip flexion, abduction, adduction, ankle plantarflexion, R hip ER, and R knee flexion   Target Date 07/11/20      PT LONG TERM GOAL #3   Title Patient to demonstrate lumbar AROM WFL and without pain limiting.    Time 6    Period Weeks    Status Partially Met   no pain, but still with limitations most in flexion and extension   Target Date 07/11/20      PT LONG TERM GOAL #4   Title Patient to report tolerance for 1 hour of upright sitting without use of additional cushion without pain limiting.    Time 6    Period Weeks    Status On-going   notes tolerance for 10 minutes sitting on cushion, and 5 miniutes without   Target Date 07/11/20      PT LONG TERM GOAL #5   Title Patient to score >45/56 on Berg to decrease risk of falls.    Time 6    Period Weeks    Status On-going   05/30/20: 39/56   Target Date 07/11/20      PT LONG TERM GOAL #6   Title Patient to score <12 sec on 5xSTS without use of UEs in order to decrease risk of falls.  Time 6    Period Weeks    Status On-going   05/30/20: 39.2 sec without cushion and with B UEs on thighs   Target Date 07/11/20                 Plan - 06/11/20 0857     Clinical Impression Statement Megan Gonzalez reports latest HEP update seems to be working well for her but requested confirmation of proper technique with open book and cat/camel - exercises reviewed with cues necessary for pacing, but good movement pattern observed. Pt reports more of "squeezing" pain in R buttock today and questions if it is related to sensitivity to inclement weather - increased muscle tension and TPs present in R upper glutes which responded with positive twitch response to DN and MT with palpable reduction in muscle tension and ttp following manual therapy. Will plan to resume strengthening focus targeting core stability and ongoing proximal LE weakness as well as balance training on next visit.    Comorbidities tremor, HA, depression, cervical CA, aniety, anemia, bladder neurostimulator, L foot surgery 2006    Rehab Potential Good    PT Frequency 2x / week    PT Duration 6 weeks    PT Treatment/Interventions ADLs/Self Care Home Management;Cryotherapy;Moist Heat;Balance training;Therapeutic exercise;Therapeutic activities;Functional mobility training;Stair training;Gait training;DME Instruction;Neuromuscular re-education;Patient/family education;Manual techniques;Taping;Energy conservation;Dry needling;Passive range of motion    PT Next Visit Plan lumbopelvic ROM, R hip and L HS strengthening, balance training without SPC- focus on turning and dynamic SLS activities    Consulted and Agree with Plan of Care Patient           Patient will benefit from skilled therapeutic intervention in order to improve the following deficits and impairments:  Abnormal gait, Hypomobility, Decreased activity tolerance, Decreased strength, Increased fascial restricitons, Pain, Decreased balance, Difficulty walking, Increased muscle spasms, Improper body mechanics, Decreased range of motion, Impaired flexibility, Postural dysfunction  Visit Diagnosis: Obstetric damage to pelvic joints and  ligaments  Muscle weakness (generalized)  Difficulty in walking, not elsewhere classified  Other symptoms and signs involving the musculoskeletal system  Unsteadiness on feet     Problem List Patient Active Problem List   Diagnosis Date Noted  . Seasonal allergies 03/10/2019  . Vitamin D deficiency 03/10/2019  . CKD (chronic kidney disease) stage 2, GFR 60-89 ml/min 09/08/2018  . Sacroiliitis ([place]) 05/09/2018  . Edema 05/09/2018  . Abdominal pain 08/25/2017  . Chronic migraine 07/26/2017  . Chronic low back pain without sciatica 07/26/2017  . Gait abnormality 07/26/2017  . Pain of left calf 10/25/2014  . History of treatment by mental health professional 10/02/2014  . Atypical chest pain 08/09/2014  . Routine general medical examination at a health care facility 08/09/2014  . Constipation, postoperative 11/19/2013  . Pelvic pain in female 11/17/2013  . Back pain 02/03/2012    Percival Spanish, PT, MPT 06/11/2020, 9:51 AM  [facility] [address]  [place], [place], [address] Phone: [phone]   Fax:  [phone]  Name: Megan Gonzalez MRN: [id number] Date of Birth: Nov 16, 1970

## 2020-06-13 ENCOUNTER — Other Ambulatory Visit: Payer: Self-pay

## 2020-06-13 ENCOUNTER — Ambulatory Visit: Payer: 59

## 2020-06-13 DIAGNOSIS — M6281 Muscle weakness (generalized): Secondary | ICD-10-CM

## 2020-06-13 DIAGNOSIS — R2681 Unsteadiness on feet: Secondary | ICD-10-CM

## 2020-06-13 DIAGNOSIS — R262 Difficulty in walking, not elsewhere classified: Secondary | ICD-10-CM

## 2020-06-13 DIAGNOSIS — R29898 Other symptoms and signs involving the musculoskeletal system: Secondary | ICD-10-CM

## 2020-06-13 NOTE — Therapy (Signed)
[facility] [address]  [place], [place], [address] Phone: [phone]   Fax:  [phone]  Physical Therapy Treatment  Patient Details  Name: Megan Gonzalez MRN: [id number] Date of Birth: 03/18/1971 Referring Provider (PT): Megan Sheer, DO   Encounter Date: 06/13/2020   PT End of Session - 06/13/20 0905    Visit Number 13    Number of Visits 21    Date for PT Re-Evaluation 07/11/20    Authorization Type UHC & Medicare    PT Start Time 0347   pt. arrived late to session   PT Stop Time 0928    PT Time Calculation (min) 31 min    Activity Tolerance Patient tolerated treatment well    Behavior During Therapy [facility] for tasks assessed/performed           Past Medical History:  Diagnosis Date   Anemia    Anxiety    Arthritis    chronic low back pain   Cancer ([place])    cervical cancer; no more uterus   Depression    Fecal incontinence    Headache(784.0)    Numbness    Overactive bladder    Sleep apnea    CPAP-non compliant   Tremor     Past Surgical History:  Procedure Laterality Date   ABDOMINAL HYSTERECTOMY     APPENDECTOMY  08/25/2017   bladder neurostimulator     COLONOSCOPY  1999   DILATION AND CURETTAGE OF UTERUS     FOOT SURGERY  2006   left   INCISION AND DRAINAGE ABSCESS N/A 11/19/2013   Procedure: POSTERIOR CULPOTOMY (INCISION AND DRAINAGE OF POST OPERATIVE CUFF HEMATOMA);  Surgeon: Jonnie Kind, MD;  Location: [facility];  Service: Gynecology;  Laterality: N/A;   LAPAROSCOPIC APPENDECTOMY N/A 08/26/2017   Procedure: APPENDECTOMY LAPAROSCOPIC;  Surgeon: Jackolyn Confer, MD;  Location: [facility];  Service: General;  Laterality: N/A;   PUBOVAGINAL SLING  03/24/2012   Procedure: Gaynelle Arabian;  Surgeon: Ailene Rud, MD;  Location: [facility];  Service: Urology;  Laterality: N/A;  lynx with [employer]    REMOVAL OF URINARY SLING     SVD     x 3    TONSILLECTOMY  as child   URETERAL EXPLORATION  5./30/13   VAGINAL HYSTERECTOMY N/A 1/2/015   [employer], Dr Linus Mako and Dr Leory Plowman, with release of Pubovag sling    There were no vitals filed for this visit.   Subjective Assessment - 06/13/20 0903    Subjective Pt. noting she had car trouble which made her late to session today.    Pertinent History tremor, HA, depression, cervical CA, aniety, anemia, bladder neurostimulator, L foot surgery 2006    Diagnostic tests none recent    Patient Stated Goals improve pain    Currently in Pain? Yes    Pain Score 3     Pain Location Abdomen    Pain Orientation Right;Posterior;Anterior;Lower    Pain Descriptors / Indicators Squeezing    Pain Type Chronic pain    Pain Frequency Intermittent    Multiple Pain Sites No                             [facility] Adult PT Treatment/Exercise - 06/13/20 0001      Lumbar Exercises: Aerobic   Nustep L4 x 6 min (UEs/LEs)      Lumbar Exercises: Standing   Row  Both;10 reps;Theraband    Theraband Level (Row) Level 2 (Red)    Row Limitations Tandem stance red TB row      Lumbar Exercises: Seated   Hip Flexion on Ball Right;Left;10 reps;Strengthening    Hip Flexion on Ball Limitations seated on cushion on chair + LE march + abdom. bracing     Other Seated Lumbar Exercises abdominal partial situp (pt. leaning back onto [facility] ball therapist supported at table x 20 - progressively lowered for increased intensity       Knee/Hip Exercises: Standing   Hip Flexion Right;Left;10 reps;Knee straight;Stengthening    Hip Flexion Limitations yellow looped TB at ankles; TM rail     Hip Abduction Right;Left;10 reps;Knee straight;Stengthening    Abduction Limitations yellow TB looped at ankles; TM rail     Hip Extension Right;Left;10 reps;Knee straight;Stengthening    Extension Limitations yellow looped TB at ankles; TM rail                     PT Short Term Goals - 05/30/20 1143        PT SHORT TERM GOAL #1   Title Patient to be independent with initial HEP.    Time 3    Period Weeks    Status Achieved    Target Date 05/08/20             PT Long Term Goals - 05/30/20 1144      PT LONG TERM GOAL #1   Title Patient to be independent with advanced HEP.    Time 6    Period Weeks    Status Partially Met   met for current   Target Date 07/11/20      PT LONG TERM GOAL #2   Title Patient to demonstrate B LE strength >/=4+/5.    Time 6    Period Weeks    Status Partially Met   improvement in B hip flexion, abduction, adduction, ankle plantarflexion, R hip ER, and R knee flexion   Target Date 07/11/20      PT LONG TERM GOAL #3   Title Patient to demonstrate lumbar AROM WFL and without pain limiting.    Time 6    Period Weeks    Status Partially Met   no pain, but still with limitations most in flexion and extension   Target Date 07/11/20      PT LONG TERM GOAL #4   Title Patient to report tolerance for 1 hour of upright sitting without use of additional cushion without pain limiting.    Time 6    Period Weeks    Status On-going   notes tolerance for 10 minutes sitting on cushion, and 5 miniutes without   Target Date 07/11/20      PT LONG TERM GOAL #5   Title Patient to score >45/56 on Berg to decrease risk of falls.    Time 6    Period Weeks    Status On-going   05/30/20: 39/56   Target Date 07/11/20      PT LONG TERM GOAL #6   Title Patient to score <12 sec on 5xSTS without use of UEs in order to decrease risk of falls.    Time 6    Period Weeks    Status On-going   05/30/20: 39.2 sec without cushion and with B UEs on thighs   Target Date 07/11/20  Plan - 06/13/20 1133    Clinical Impression Statement [name] arrived to session late today which she attributes to car trouble thus tx time limited.  Session focused on proximal hip strengthening incorporating dynamic SLS for balance training with intermittent TM rail support.   Also progressed abdominal strengthening which was tolerated well without pain.  pt. had most difficulty with red TB row in tandem stance requiring therapist CGA/supervision to maintain balance.  Did end visit pain free thus modalities deferred.    Comorbidities tremor, HA, depression, cervical CA, aniety, anemia, bladder neurostimulator, L foot surgery 2006    Rehab Potential Good    PT Frequency 2x / week    PT Duration 6 weeks    PT Treatment/Interventions ADLs/Self Care Home Management;Cryotherapy;Moist Heat;Balance training;Therapeutic exercise;Therapeutic activities;Functional mobility training;Stair training;Gait training;DME Instruction;Neuromuscular re-education;Patient/family education;Manual techniques;Taping;Energy conservation;Dry needling;Passive range of motion    PT Next Visit Plan lumbopelvic ROM, R hip and L HS strengthening, balance training without SPC- focus on turning and dynamic SLS activities    Consulted and Agree with Plan of Care Patient           Patient will benefit from skilled therapeutic intervention in order to improve the following deficits and impairments:  Abnormal gait, Hypomobility, Decreased activity tolerance, Decreased strength, Increased fascial restricitons, Pain, Decreased balance, Difficulty walking, Increased muscle spasms, Improper body mechanics, Decreased range of motion, Impaired flexibility, Postural dysfunction  Visit Diagnosis: Obstetric damage to pelvic joints and ligaments  Muscle weakness (generalized)  Difficulty in walking, not elsewhere classified  Other symptoms and signs involving the musculoskeletal system  Unsteadiness on feet     Problem List Patient Active Problem List   Diagnosis Date Noted   Seasonal allergies 03/10/2019   Vitamin D deficiency 03/10/2019   CKD (chronic kidney disease) stage 2, GFR 60-89 ml/min 09/08/2018   Sacroiliitis ([place]) 05/09/2018   Edema 05/09/2018   Abdominal pain 08/25/2017    Chronic migraine 07/26/2017   Chronic low back pain without sciatica 07/26/2017   Gait abnormality 07/26/2017   Pain of left calf 10/25/2014   History of treatment by mental health professional 10/02/2014   Atypical chest pain 08/09/2014   Routine general medical examination at a health care facility 08/09/2014   Constipation, postoperative 11/19/2013   Pelvic pain in female 11/17/2013   Back pain 02/03/2012    Bess Harvest, PTA 06/13/20 11:42 AM    [facility] [address]  [place], [place], [address] Phone: [phone]   Fax:  [phone]  Name: Megan Gonzalez MRN: [id number] Date of Birth: 01/05/1971

## 2020-06-18 ENCOUNTER — Encounter: Payer: Self-pay | Admitting: Physical Therapy

## 2020-06-18 ENCOUNTER — Ambulatory Visit: Payer: 59 | Admitting: Physical Therapy

## 2020-06-18 ENCOUNTER — Other Ambulatory Visit: Payer: Self-pay

## 2020-06-18 DIAGNOSIS — R262 Difficulty in walking, not elsewhere classified: Secondary | ICD-10-CM

## 2020-06-18 DIAGNOSIS — M6281 Muscle weakness (generalized): Secondary | ICD-10-CM

## 2020-06-18 DIAGNOSIS — R2681 Unsteadiness on feet: Secondary | ICD-10-CM

## 2020-06-18 DIAGNOSIS — R29898 Other symptoms and signs involving the musculoskeletal system: Secondary | ICD-10-CM

## 2020-06-18 NOTE — Therapy (Signed)
[facility] [address]  [place], [place], [address] Phone: [phone]   Fax:  [phone]  Physical Therapy Treatment  Patient Details  Name: Megan Gonzalez MRN: [id number] Date of Birth: [birth year] Referring Provider (PT): Riki Sheer, DO   Encounter Date: 06/18/2020   PT End of Session - 06/18/20 0852    Visit Number 14    Number of Visits 21    Date for PT Re-Evaluation 07/11/20    Authorization Type UHC & Medicare    PT Start Time 0852   Pt arrived late   PT Stop Time 0930    PT Time Calculation (min) 38 min    Activity Tolerance Patient tolerated treatment well    Behavior During Therapy [facility] for tasks assessed/performed           Past Medical History:  Diagnosis Date  . Anemia   . Anxiety   . Arthritis    chronic low back pain  . Cancer ([place])    cervical cancer; no more uterus  . Depression   . Fecal incontinence   . Headache(784.0)   . Numbness   . Overactive bladder   . Sleep apnea    CPAP-non compliant  . Tremor     Past Surgical History:  Procedure Laterality Date  . ABDOMINAL HYSTERECTOMY    . APPENDECTOMY  08/25/2017  . bladder neurostimulator    . COLONOSCOPY  1999  . DILATION AND CURETTAGE OF UTERUS    . FOOT SURGERY  2006   left  . INCISION AND DRAINAGE ABSCESS N/A 11/19/2013   Procedure: POSTERIOR CULPOTOMY (INCISION AND DRAINAGE OF POST OPERATIVE CUFF HEMATOMA);  Surgeon: Jonnie Kind, MD;  Location: [facility];  Service: Gynecology;  Laterality: N/A;  . LAPAROSCOPIC APPENDECTOMY N/A 08/26/2017   Procedure: APPENDECTOMY LAPAROSCOPIC;  Surgeon: Jackolyn Confer, MD;  Location: [facility];  Service: General;  Laterality: N/A;  . PUBOVAGINAL SLING  03/24/2012   Procedure: Gaynelle Arabian;  Surgeon: Ailene Rud, MD;  Location: [facility];  Service: Urology;  Laterality: N/A;  lynx with [employer]   . REMOVAL OF URINARY SLING    . SVD     x 3  . TONSILLECTOMY   as child  . URETERAL EXPLORATION  5./30/13  . VAGINAL HYSTERECTOMY N/A 1/2/015   [employer], Dr Linus Mako and Dr Leory Plowman, with release of Pubovag sling    There were no vitals filed for this visit.   Subjective Assessment - 06/18/20 0855    Subjective Pt reports her pain is better today - not having the squeezing pain, just a dull ache.    Pertinent History tremor, HA, depression, cervical CA, aniety, anemia, bladder neurostimulator, L foot surgery 2006    Diagnostic tests none recent    Patient Stated Goals improve pain    Currently in Pain? Yes    Pain Score 4    3-4/10   Pain Location Buttocks    Pain Orientation Right    Pain Descriptors / Indicators Dull;Aching    Pain Type Chronic pain    Pain Frequency Intermittent                             [facility] Adult PT Treatment/Exercise - 06/18/20 0852      Lumbar Exercises: Aerobic   Nustep L4 x 6 min (UEs/LEs)      Lumbar Exercises: Seated   Long Arc Quad on  Ball Right;Left;10 reps    LAQ on Ball Limitations green Pball    Hip Flexion on Ball Right;Left;10 reps;Strengthening   2 sets   Hip Flexion on Ball Limitations 2nd set with looped red TB at knees; seated on green Pball    Other Seated Lumbar Exercises Pelvic tilt - seated on green Pball x 10    Other Seated Lumbar Exercises Abd bracing + red TB rows, horiz ABD, low rows/shoulder extension, UE diagonals & B pallof press x 10 each; seated on green Pball      Lumbar Exercises: Quadruped   Opposite Arm/Leg Raise 10 reps;3 seconds    Opposite Arm/Leg Raise Limitations peanut ball under torso      Knee/Hip Exercises: Seated   Clamshell with TheraBand Red   10 x 3"   Knee/Hip Flexion seated on green Pball                    PT Short Term Goals - 05/30/20 1143      PT SHORT TERM GOAL #1   Title Patient to be independent with initial HEP.    Time 3    Period Weeks    Status Achieved    Target Date 05/08/20             PT Long  Term Goals - 05/30/20 1144      PT LONG TERM GOAL #1   Title Patient to be independent with advanced HEP.    Time 6    Period Weeks    Status Partially Met   met for current   Target Date 07/11/20      PT LONG TERM GOAL #2   Title Patient to demonstrate B LE strength >/=4+/5.    Time 6    Period Weeks    Status Partially Met   improvement in B hip flexion, abduction, adduction, ankle plantarflexion, R hip ER, and R knee flexion   Target Date 07/11/20      PT LONG TERM GOAL #3   Title Patient to demonstrate lumbar AROM WFL and without pain limiting.    Time 6    Period Weeks    Status Partially Met   no pain, but still with limitations most in flexion and extension   Target Date 07/11/20      PT LONG TERM GOAL #4   Title Patient to report tolerance for 1 hour of upright sitting without use of additional cushion without pain limiting.    Time 6    Period Weeks    Status On-going   notes tolerance for 10 minutes sitting on cushion, and 5 miniutes without   Target Date 07/11/20      PT LONG TERM GOAL #5   Title Patient to score >45/56 on Berg to decrease risk of falls.    Time 6    Period Weeks    Status On-going   05/30/20: 39/56   Target Date 07/11/20      PT LONG TERM GOAL #6   Title Patient to score <12 sec on 5xSTS without use of UEs in order to decrease risk of falls.    Time 6    Period Weeks    Status On-going   05/30/20: 39.2 sec without cushion and with B UEs on thighs   Target Date 07/11/20                 Plan - 06/18/20 0858    Clinical Impression Statement October reports pain  is better today - less squeezing pain and more just a dull ache - denies need for DN or MT and wanting to focus more on core strength and balance. Introduced core strengthening/stabilization and balance exercises seated on green Pball emphasizing lumbopelvic control with proximal UE and LE strengthening. All exercises well tolerated but pt requiring intermittent UE support to  maintain balance on ball during some of LE exercises.    Comorbidities tremor, HA, depression, cervical CA, aniety, anemia, bladder neurostimulator, L foot surgery 2006    Rehab Potential Good    PT Frequency 2x / week    PT Duration 6 weeks    PT Treatment/Interventions ADLs/Self Care Home Management;Cryotherapy;Moist Heat;Balance training;Therapeutic exercise;Therapeutic activities;Functional mobility training;Stair training;Gait training;DME Instruction;Neuromuscular re-education;Patient/family education;Manual techniques;Taping;Energy conservation;Dry needling;Passive range of motion    PT Next Visit Plan lumbopelvic ROM, R hip and L HS strengthening, balance training without SPC- focus on turning and dynamic SLS activities    Consulted and Agree with Plan of Care Patient           Patient will benefit from skilled therapeutic intervention in order to improve the following deficits and impairments:  Abnormal gait, Hypomobility, Decreased activity tolerance, Decreased strength, Increased fascial restricitons, Pain, Decreased balance, Difficulty walking, Increased muscle spasms, Improper body mechanics, Decreased range of motion, Impaired flexibility, Postural dysfunction  Visit Diagnosis: Obstetric damage to pelvic joints and ligaments  Muscle weakness (generalized)  Difficulty in walking, not elsewhere classified  Other symptoms and signs involving the musculoskeletal system  Unsteadiness on feet     Problem List Patient Active Problem List   Diagnosis Date Noted  . Seasonal allergies 03/10/2019  . Vitamin D deficiency 03/10/2019  . CKD (chronic kidney disease) stage 2, GFR 60-89 ml/min 09/08/2018  . Sacroiliitis ([place]) 05/09/2018  . Edema 05/09/2018  . Abdominal pain 08/25/2017  . Chronic migraine 07/26/2017  . Chronic low back pain without sciatica 07/26/2017  . Gait abnormality 07/26/2017  . Pain of left calf 10/25/2014  . History of treatment by mental health  professional 10/02/2014  . Atypical chest pain 08/09/2014  . Routine general medical examination at a health care facility 08/09/2014  . Constipation, postoperative 11/19/2013  . Pelvic pain in female 11/17/2013  . Back pain 02/03/2012    Percival Spanish, PT, MPT 06/18/2020, 10:45 AM  [facility] [address]  [place] [employer] [place], [place], [address] Phone: [phone]   Fax:  [phone]  Name: Megan Gonzalez MRN: [id number] Date of Birth: 09-14-1971

## 2020-06-20 ENCOUNTER — Ambulatory Visit: Payer: 59 | Admitting: Physical Therapy

## 2020-06-20 ENCOUNTER — Encounter: Payer: Self-pay | Admitting: Physical Therapy

## 2020-06-20 ENCOUNTER — Other Ambulatory Visit: Payer: Self-pay

## 2020-06-20 DIAGNOSIS — R262 Difficulty in walking, not elsewhere classified: Secondary | ICD-10-CM

## 2020-06-20 DIAGNOSIS — M6281 Muscle weakness (generalized): Secondary | ICD-10-CM

## 2020-06-20 DIAGNOSIS — R29898 Other symptoms and signs involving the musculoskeletal system: Secondary | ICD-10-CM

## 2020-06-20 DIAGNOSIS — R2681 Unsteadiness on feet: Secondary | ICD-10-CM

## 2020-06-20 NOTE — Therapy (Signed)
[facility] [address]  [place], [place], [address] Phone: [phone]   Fax:  [phone]  Physical Therapy Treatment  Patient Details  Name: Megan Gonzalez MRN: [id number] Date of Birth: 1971/01/29 Referring Provider (PT): Riki Sheer, DO   Encounter Date: 06/20/2020   PT End of Session - 06/20/20 0933    Visit Number 15    Number of Visits 21    Date for PT Re-Evaluation 07/11/20    Authorization Type UHC & Medicare    PT Start Time 0849    PT Stop Time 0928    PT Time Calculation (min) 39 min    Activity Tolerance Patient tolerated treatment well    Behavior During Therapy [facility] for tasks assessed/performed           Past Medical History:  Diagnosis Date  . Anemia   . Anxiety   . Arthritis    chronic low back pain  . Cancer ([place])    cervical cancer; no more uterus  . Depression   . Fecal incontinence   . Headache(784.0)   . Numbness   . Overactive bladder   . Sleep apnea    CPAP-non compliant  . Tremor     Past Surgical History:  Procedure Laterality Date  . ABDOMINAL HYSTERECTOMY    . APPENDECTOMY  08/25/2017  . bladder neurostimulator    . COLONOSCOPY  1999  . DILATION AND CURETTAGE OF UTERUS    . FOOT SURGERY  2006   left  . INCISION AND DRAINAGE ABSCESS N/A 11/19/2013   Procedure: POSTERIOR CULPOTOMY (INCISION AND DRAINAGE OF POST OPERATIVE CUFF HEMATOMA);  Surgeon: Jonnie Kind, MD;  Location: [facility];  Service: Gynecology;  Laterality: N/A;  . LAPAROSCOPIC APPENDECTOMY N/A 08/26/2017   Procedure: APPENDECTOMY LAPAROSCOPIC;  Surgeon: Jackolyn Confer, MD;  Location: [facility];  Service: General;  Laterality: N/A;  . PUBOVAGINAL SLING  03/24/2012   Procedure: Gaynelle Arabian;  Surgeon: Ailene Rud, MD;  Location: [facility];  Service: Urology;  Laterality: N/A;  lynx with Chemical engineer   . REMOVAL OF URINARY SLING    . SVD     x 3  . TONSILLECTOMY  as child  .  URETERAL EXPLORATION  5./30/13  . VAGINAL HYSTERECTOMY N/A 1/2/015   [employer], Dr Linus Mako and Dr Leory Plowman, with release of Pubovag sling    There were no vitals filed for this visit.   Subjective Assessment - 06/20/20 0852    Subjective Not much new since last session. HEP is going well.    Pertinent History tremor, HA, depression, cervical CA, aniety, anemia, bladder neurostimulator, L foot surgery 2006    Diagnostic tests none recent    Patient Stated Goals improve pain    Currently in Pain? Yes    Pain Score 4     Pain Location Buttocks    Pain Orientation Right    Pain Descriptors / Indicators Aching;Dull    Pain Type Chronic pain                             [facility] Adult PT Treatment/Exercise - 06/20/20 0001      Neuro Re-ed    Neuro Re-ed Details  alt foot tap on 9" step with hand hovering over counter 3x30"   mild B hip drop     Lumbar Exercises: Aerobic   Nustep L4 x 6 min (UEs/LEs)  Knee/Hip Exercises: Standing   Knee Flexion Strengthening;Right;Left;1 set;10 reps    Knee Flexion Limitations HS curl with yellow loop   cues to decrease jerking motion   Hip Flexion Stengthening;Right;Left;1 set;10 reps;Knee straight    Hip Flexion Limitations 2 ski poles, red TB    Hip ADduction Strengthening;Right;Left;1 set;10 reps    Hip ADduction Limitations 2 ski poles, red TB    Hip Abduction Stengthening;Right;Left;1 set;10 reps;Knee straight    Abduction Limitations 2 ski poles, red TB    Hip Extension Right;Left;10 reps;Knee straight;Stengthening    Extension Limitations 2 ski poles, red TB    Forward Step Up Right;Left;1 set;5 reps;Hand Hold: 1;Step Height: 6"    Forward Step Up Limitations R/L step up.back   more difficulty with R step up hip drop; R buttock pain                 PT Education - 06/20/20 0933    Education Details update to HEP    Person(s) Educated Patient    Methods Explanation;Demonstration;Tactile cues;Verbal  cues;Handout    Comprehension Verbalized understanding;Returned demonstration            PT Short Term Goals - 05/30/20 1143      PT SHORT TERM GOAL #1   Title Patient to be independent with initial HEP.    Time 3    Period Weeks    Status Achieved    Target Date 05/08/20             PT Long Term Goals - 05/30/20 1144      PT LONG TERM GOAL #1   Title Patient to be independent with advanced HEP.    Time 6    Period Weeks    Status Partially Met   met for current   Target Date 07/11/20      PT LONG TERM GOAL #2   Title Patient to demonstrate B LE strength >/=4+/5.    Time 6    Period Weeks    Status Partially Met   improvement in B hip flexion, abduction, adduction, ankle plantarflexion, R hip ER, and R knee flexion   Target Date 07/11/20      PT LONG TERM GOAL #3   Title Patient to demonstrate lumbar AROM WFL and without pain limiting.    Time 6    Period Weeks    Status Partially Met   no pain, but still with limitations most in flexion and extension   Target Date 07/11/20      PT LONG TERM GOAL #4   Title Patient to report tolerance for 1 hour of upright sitting without use of additional cushion without pain limiting.    Time 6    Period Weeks    Status On-going   notes tolerance for 10 minutes sitting on cushion, and 5 miniutes without   Target Date 07/11/20      PT LONG TERM GOAL #5   Title Patient to score >45/56 on Berg to decrease risk of falls.    Time 6    Period Weeks    Status On-going   05/30/20: 39/56   Target Date 07/11/20      PT LONG TERM GOAL #6   Title Patient to score <12 sec on 5xSTS without use of UEs in order to decrease risk of falls.    Time 6    Period Weeks    Status On-going   05/30/20: 39.2 sec without cushion and with B UEs on  thighs   Target Date 07/11/20                 Plan - 06/20/20 0934    Clinical Impression Statement Patient arrived to session without new complaints. Addressed standing LE strengthening and  balance training this session. Patient demonstrated good ROM with hamstring curls, but required cueing to avoid jerking motion. Able to demonstrate good single leg stability and ROM with 4 way hip in B LEs, intermittently requiring minor cues to avoid trunk lean as compensation. CGA provided for several standing activities today to ensure safety but patient did not demonstrate LOB. Initiated short step ups with patient tolerating this activity well despite noting a hx of R SIJ pain with stairs. Did demonstrate hip drop B with standing activities, indicating need for continued hip strengthening activities. Update HEP with 4 way hip- patient reported understanding and without complaints at end of session. Patient progressing well towards goals.    Comorbidities tremor, HA, depression, cervical CA, aniety, anemia, bladder neurostimulator, L foot surgery 2006    Rehab Potential Good    PT Frequency 2x / week    PT Duration 6 weeks    PT Treatment/Interventions ADLs/Self Care Home Management;Cryotherapy;Moist Heat;Balance training;Therapeutic exercise;Therapeutic activities;Functional mobility training;Stair training;Gait training;DME Instruction;Neuromuscular re-education;Patient/family education;Manual techniques;Taping;Energy conservation;Dry needling;Passive range of motion    PT Next Visit Plan lumbopelvic ROM, R hip and L HS strengthening, balance training without SPC- focus on turning and dynamic SLS activities    Consulted and Agree with Plan of Care Patient           Patient will benefit from skilled therapeutic intervention in order to improve the following deficits and impairments:  Abnormal gait, Hypomobility, Decreased activity tolerance, Decreased strength, Increased fascial restricitons, Pain, Decreased balance, Difficulty walking, Increased muscle spasms, Improper body mechanics, Decreased range of motion, Impaired flexibility, Postural dysfunction  Visit Diagnosis: Obstetric damage to  pelvic joints and ligaments  Muscle weakness (generalized)  Difficulty in walking, not elsewhere classified  Other symptoms and signs involving the musculoskeletal system  Unsteadiness on feet     Problem List Patient Active Problem List   Diagnosis Date Noted  . Seasonal allergies 03/10/2019  . Vitamin D deficiency 03/10/2019  . CKD (chronic kidney disease) stage 2, GFR 60-89 ml/min 09/08/2018  . Sacroiliitis ([place]) 05/09/2018  . Edema 05/09/2018  . Abdominal pain 08/25/2017  . Chronic migraine 07/26/2017  . Chronic low back pain without sciatica 07/26/2017  . Gait abnormality 07/26/2017  . Pain of left calf 10/25/2014  . History of treatment by mental health professional 10/02/2014  . Atypical chest pain 08/09/2014  . Routine general medical examination at a health care facility 08/09/2014  . Constipation, postoperative 11/19/2013  . Pelvic pain in female 11/17/2013  . Back pain 02/03/2012     Janene Harvey, PT, DPT 06/20/20 9:36 AM   [facility] [place], [place], [address] Phone: [phone]   Fax:  [phone]  Name: Megan Gonzalez MRN: [id number] Date of Birth: 19-Jun-1971

## 2020-06-25 ENCOUNTER — Other Ambulatory Visit: Payer: Self-pay

## 2020-06-25 ENCOUNTER — Encounter: Payer: Self-pay | Admitting: Physical Therapy

## 2020-06-25 ENCOUNTER — Ambulatory Visit: Payer: 59 | Admitting: Physical Therapy

## 2020-06-25 DIAGNOSIS — M6281 Muscle weakness (generalized): Secondary | ICD-10-CM

## 2020-06-25 DIAGNOSIS — R262 Difficulty in walking, not elsewhere classified: Secondary | ICD-10-CM

## 2020-06-25 DIAGNOSIS — R2681 Unsteadiness on feet: Secondary | ICD-10-CM

## 2020-06-25 DIAGNOSIS — R29898 Other symptoms and signs involving the musculoskeletal system: Secondary | ICD-10-CM

## 2020-06-25 NOTE — Therapy (Signed)
[facility] [address]  [place], [place], [address] Phone: [phone]   Fax:  [phone]  Physical Therapy Treatment  Patient Details  Name: Megan Gonzalez MRN: [id number] Date of Birth: 01/14/1971 Referring Provider (PT): Riki Sheer, DO   Encounter Date: 06/25/2020   PT End of Session - 06/25/20 0930    Visit Number 16    Number of Visits 21    Date for PT Re-Evaluation 07/11/20    Authorization Type UHC & Medicare    PT Start Time 0849    PT Stop Time 0928    PT Time Calculation (min) 39 min    Activity Tolerance Patient tolerated treatment well    Behavior During Therapy [facility] for tasks assessed/performed           Past Medical History:  Diagnosis Date  . Anemia   . Anxiety   . Arthritis    chronic low back pain  . Cancer ([place])    cervical cancer; no more uterus  . Depression   . Fecal incontinence   . Headache(784.0)   . Numbness   . Overactive bladder   . Sleep apnea    CPAP-non compliant  . Tremor     Past Surgical History:  Procedure Laterality Date  . ABDOMINAL HYSTERECTOMY    . APPENDECTOMY  08/25/2017  . bladder neurostimulator    . COLONOSCOPY  1999  . DILATION AND CURETTAGE OF UTERUS    . FOOT SURGERY  2006   left  . INCISION AND DRAINAGE ABSCESS N/A 11/19/2013   Procedure: POSTERIOR CULPOTOMY (INCISION AND DRAINAGE OF POST OPERATIVE CUFF HEMATOMA);  Surgeon: Jonnie Kind, MD;  Location: [facility];  Service: Gynecology;  Laterality: N/A;  . LAPAROSCOPIC APPENDECTOMY N/A 08/26/2017   Procedure: APPENDECTOMY LAPAROSCOPIC;  Surgeon: Jackolyn Confer, MD;  Location: [facility];  Service: General;  Laterality: N/A;  . PUBOVAGINAL SLING  03/24/2012   Procedure: Gaynelle Arabian;  Surgeon: Ailene Rud, MD;  Location: [facility];  Service: Urology;  Laterality: N/A;  lynx with Chemical engineer   . REMOVAL OF URINARY SLING    . SVD     x 3  . TONSILLECTOMY  as child  .  URETERAL EXPLORATION  5./30/13  . VAGINAL HYSTERECTOMY N/A 1/2/015   [employer], Dr Linus Mako and Dr Leory Plowman, with release of Pubovag sling    There were no vitals filed for this visit.   Subjective Assessment - 06/25/20 0850    Subjective Having some R groin pain which comes and goes. Everything has been going "steady." New HEP is going well.    Pertinent History tremor, HA, depression, cervical CA, aniety, anemia, bladder neurostimulator, L foot surgery 2006    Diagnostic tests none recent    Patient Stated Goals improve pain    Currently in Pain? Yes    Pain Score 5     Multiple Pain Sites Yes    Pain Score 5    Pain Location Hip    Pain Orientation Right;Anterior    Pain Descriptors / Indicators Tightness;Squeezing;Throbbing    Pain Type Chronic pain                             [facility] Adult PT Treatment/Exercise - 06/25/20 0001      Neuro Re-ed    Neuro Re-ed Details  sidestepping over beanbags and cones with hands hovering over counter x5 min  1 episode of LOB requiring min A     Lumbar Exercises: Aerobic   Nustep L4 x 6 min (UEs/LEs)      Lumbar Exercises: Standing   Row Both;10 reps;Theraband    Theraband Level (Row) Level 3 (Green)    Row Limitations 2x10; cues to maintain proper posture    Other Standing Lumbar Exercises R/L paloff press with red TB x10 each side   cues to maintain shoulders down and core contracted     Knee/Hip Exercises: Stretches   Hip Flexor Stretch Right;2 reps;30 seconds    Hip Flexor Stretch Limitations attempted in staggered standing and sitting- better stretch in sitting      Knee/Hip Exercises: Seated   Sit to Sand 1 set;10 reps;with UE support;without UE support   red loop above knees; 5x push off knees, 5x no UEs                 PT Education - 06/25/20 0929    Education Details update to HEP    Person(s) Educated Patient    Methods Explanation;Demonstration;Tactile cues;Verbal cues;Handout     Comprehension Verbalized understanding;Returned demonstration            PT Short Term Goals - 05/30/20 1143      PT SHORT TERM GOAL #1   Title Patient to be independent with initial HEP.    Time 3    Period Weeks    Status Achieved    Target Date 05/08/20             PT Long Term Goals - 05/30/20 1144      PT LONG TERM GOAL #1   Title Patient to be independent with advanced HEP.    Time 6    Period Weeks    Status Partially Met   met for current   Target Date 07/11/20      PT LONG TERM GOAL #2   Title Patient to demonstrate B LE strength >/=4+/5.    Time 6    Period Weeks    Status Partially Met   improvement in B hip flexion, abduction, adduction, ankle plantarflexion, R hip ER, and R knee flexion   Target Date 07/11/20      PT LONG TERM GOAL #3   Title Patient to demonstrate lumbar AROM WFL and without pain limiting.    Time 6    Period Weeks    Status Partially Met   no pain, but still with limitations most in flexion and extension   Target Date 07/11/20      PT LONG TERM GOAL #4   Title Patient to report tolerance for 1 hour of upright sitting without use of additional cushion without pain limiting.    Time 6    Period Weeks    Status On-going   notes tolerance for 10 minutes sitting on cushion, and 5 miniutes without   Target Date 07/11/20      PT LONG TERM GOAL #5   Title Patient to score >45/56 on Berg to decrease risk of falls.    Time 6    Period Weeks    Status On-going   05/30/20: 39/56   Target Date 07/11/20      PT LONG TERM GOAL #6   Title Patient to score <12 sec on 5xSTS without use of UEs in order to decrease risk of falls.    Time 6    Period Weeks    Status On-going   05/30/20: 39.2 sec  without cushion and with B UEs on thighs   Target Date 07/11/20                 Plan - 06/25/20 0930    Clinical Impression Statement Patient reporting R anterior hip tightness this AM but noting no issues with new HEP update. Began session  with gentle hip flexor stretching with patient reporting better stretch in sitting. Initiated resisted anti-rotation core strengthening. Patient received cueing to contract core and maintain neutral spine. Able to perform resisted rows with increased banded resistance with patient demonstrating good form after corrective postural cueing. Wide BOS required with all standing resisted ther-ex, but patient able to maintain balance with LOB or external support throughout, indicating an improvement in dynamic balance. Excellent improvement demonstrated with STS transfers today, as patient able to perform this without UE support and with additional resistance at lateral hips. Sidestepping was performed over obstacles with more challenge and instability to R vs. L side. Patient did have 1 episode of LOB requiring min A to re-balance. Updated HEP with hip flexor stretch- patient reported understanding and without complaints at end of session. Patient progressing well towards goals.    Comorbidities tremor, HA, depression, cervical CA, aniety, anemia, bladder neurostimulator, L foot surgery 2006    Rehab Potential Good    PT Frequency 2x / week    PT Duration 6 weeks    PT Treatment/Interventions ADLs/Self Care Home Management;Cryotherapy;Moist Heat;Balance training;Therapeutic exercise;Therapeutic activities;Functional mobility training;Stair training;Gait training;DME Instruction;Neuromuscular re-education;Patient/family education;Manual techniques;Taping;Energy conservation;Dry needling;Passive range of motion    PT Next Visit Plan lumbopelvic ROM, R hip and L HS strengthening, balance training without SPC- focus on turning and dynamic SLS activities    Consulted and Agree with Plan of Care Patient           Patient will benefit from skilled therapeutic intervention in order to improve the following deficits and impairments:  Abnormal gait, Hypomobility, Decreased activity tolerance, Decreased strength,  Increased fascial restricitons, Pain, Decreased balance, Difficulty walking, Increased muscle spasms, Improper body mechanics, Decreased range of motion, Impaired flexibility, Postural dysfunction  Visit Diagnosis: Obstetric damage to pelvic joints and ligaments  Muscle weakness (generalized)  Difficulty in walking, not elsewhere classified  Other symptoms and signs involving the musculoskeletal system  Unsteadiness on feet     Problem List Patient Active Problem List   Diagnosis Date Noted  . Seasonal allergies 03/10/2019  . Vitamin D deficiency 03/10/2019  . CKD (chronic kidney disease) stage 2, GFR 60-89 ml/min 09/08/2018  . Sacroiliitis ([place]) 05/09/2018  . Edema 05/09/2018  . Abdominal pain 08/25/2017  . Chronic migraine 07/26/2017  . Chronic low back pain without sciatica 07/26/2017  . Gait abnormality 07/26/2017  . Pain of left calf 10/25/2014  . History of treatment by mental health professional 10/02/2014  . Atypical chest pain 08/09/2014  . Routine general medical examination at a health care facility 08/09/2014  . Constipation, postoperative 11/19/2013  . Pelvic pain in female 11/17/2013  . Back pain 02/03/2012     Janene Harvey, PT, DPT 06/25/20 9:34 AM   [facility] [place], [place], [address] Phone: [phone]   Fax:  [phone]  Name: Megan Gonzalez MRN: [id number] Date of Birth: [birth year]

## 2020-06-27 ENCOUNTER — Ambulatory Visit: Payer: 59 | Attending: Family Medicine | Admitting: Physical Therapy

## 2020-06-27 ENCOUNTER — Other Ambulatory Visit: Payer: Self-pay

## 2020-06-27 DIAGNOSIS — R2681 Unsteadiness on feet: Secondary | ICD-10-CM | POA: Diagnosis present

## 2020-06-27 DIAGNOSIS — M6281 Muscle weakness (generalized): Secondary | ICD-10-CM | POA: Diagnosis present

## 2020-06-27 DIAGNOSIS — R29898 Other symptoms and signs involving the musculoskeletal system: Secondary | ICD-10-CM

## 2020-06-27 DIAGNOSIS — R262 Difficulty in walking, not elsewhere classified: Secondary | ICD-10-CM | POA: Diagnosis present

## 2020-06-27 NOTE — Therapy (Signed)
[facility] [address]  [place], [place], [address] Phone: [phone]   Fax:  [phone]  Physical Therapy Treatment  Patient Details  Name: Megan Gonzalez MRN: [id number] Date of Birth: June 19, 1971 Referring Provider (PT): Riki Sheer, DO   Encounter Date: 06/27/2020   PT End of Session - 06/27/20 0855    Visit Number 17    Number of Visits 21    Date for PT Re-Evaluation 07/11/20    Authorization Type UHC & Medicare    PT Start Time 0855    PT Stop Time 0935    PT Time Calculation (min) 40 min    Activity Tolerance Patient tolerated treatment well    Behavior During Therapy [facility] for tasks assessed/performed           Past Medical History:  Diagnosis Date  . Anemia   . Anxiety   . Arthritis    chronic low back pain  . Cancer ([place])    cervical cancer; no more uterus  . Depression   . Fecal incontinence   . Headache(784.0)   . Numbness   . Overactive bladder   . Sleep apnea    CPAP-non compliant  . Tremor     Past Surgical History:  Procedure Laterality Date  . ABDOMINAL HYSTERECTOMY    . APPENDECTOMY  08/25/2017  . bladder neurostimulator    . COLONOSCOPY  1999  . DILATION AND CURETTAGE OF UTERUS    . FOOT SURGERY  2006   left  . INCISION AND DRAINAGE ABSCESS N/A 11/19/2013   Procedure: POSTERIOR CULPOTOMY (INCISION AND DRAINAGE OF POST OPERATIVE CUFF HEMATOMA);  Surgeon: Jonnie Kind, MD;  Location: [facility];  Service: Gynecology;  Laterality: N/A;  . LAPAROSCOPIC APPENDECTOMY N/A 08/26/2017   Procedure: APPENDECTOMY LAPAROSCOPIC;  Surgeon: Jackolyn Confer, MD;  Location: [facility];  Service: General;  Laterality: N/A;  . PUBOVAGINAL SLING  03/24/2012   Procedure: Gaynelle Arabian;  Surgeon: Ailene Rud, MD;  Location: [facility];  Service: Urology;  Laterality: N/A;  lynx with Chemical engineer   . REMOVAL OF URINARY SLING    . SVD     x 3  . TONSILLECTOMY  as child  .  URETERAL EXPLORATION  5./30/13  . VAGINAL HYSTERECTOMY N/A 1/2/015   [employer], Dr Linus Mako and Dr Leory Plowman, with release of Pubovag sling    There were no vitals filed for this visit.   Subjective Assessment - 06/27/20 0859    Subjective Pain mostly localized to the front of her hip/groin "near my ovaries" today. Denies need for DN today, wanting to work on "regular routine" with emphasis on core. States she like the variety of exercises she does with the different therapists.    Pertinent History tremor, HA, depression, cervical CA, aniety, anemia, bladder neurostimulator, L foot surgery 2006    Diagnostic tests none recent    Patient Stated Goals improve pain    Currently in Pain? Yes    Pain Score 3     Pain Location Groin    Pain Orientation Right;Anterior    Pain Descriptors / Indicators Aching;Nagging    Pain Type Chronic pain                             [facility] Adult PT Treatment/Exercise - 06/27/20 0855      Lumbar Exercises: Aerobic   Nustep L4 x 5 min (UEs/LEs)  Lumbar Exercises: Seated   Long Arc Quad on [employer] Right;Left;10 reps    LAQ on [place] Limitations green Pball    Hip Flexion on Ball Right;Left;10 reps;Strengthening   2 sets   Hip Flexion on Ball Limitations 2nd set + opp UE flexion; seated on green Pballl    Other Seated Lumbar Exercises Abd bracing + green TB horiz ABD, UE diagonals, low rows/shoulder extension & B pallof press x 10 each; seated on green Pball      Lumbar Exercises: Quadruped   Opposite Arm/Leg Raise 10 reps;3 seconds    Opposite Arm/Leg Raise Limitations peanut ball under torso    Other Quadruped Lumbar Exercises R/L fire hydrants 5 x 3" - cues to aovid trunk rotation      Knee/Hip Exercises: Seated   Clamshell with TheraBand Green   10 x 3"   Knee/Hip Flexion seated on green Pball - cues for abd bracing      Knee/Hip Exercises: Supine   Other Supine Knee/Hip Exercises Straight leg bridge 5 x 5"; Straight leg  bridge + HS curl 5 x 5"                    PT Short Term Goals - 05/30/20 1143      PT SHORT TERM GOAL #1   Title Patient to be independent with initial HEP.    Time 3    Period Weeks    Status Achieved    Target Date 05/08/20             PT Long Term Goals - 05/30/20 1144      PT LONG TERM GOAL #1   Title Patient to be independent with advanced HEP.    Time 6    Period Weeks    Status Partially Met   met for current   Target Date 07/11/20      PT LONG TERM GOAL #2   Title Patient to demonstrate B LE strength >/=4+/5.    Time 6    Period Weeks    Status Partially Met   improvement in B hip flexion, abduction, adduction, ankle plantarflexion, R hip ER, and R knee flexion   Target Date 07/11/20      PT LONG TERM GOAL #3   Title Patient to demonstrate lumbar AROM WFL and without pain limiting.    Time 6    Period Weeks    Status Partially Met   no pain, but still with limitations most in flexion and extension   Target Date 07/11/20      PT LONG TERM GOAL #4   Title Patient to report tolerance for 1 hour of upright sitting without use of additional cushion without pain limiting.    Time 6    Period Weeks    Status On-going   notes tolerance for 10 minutes sitting on cushion, and 5 miniutes without   Target Date 07/11/20      PT LONG TERM GOAL #5   Title Patient to score >45/56 on Berg to decrease risk of falls.    Time 6    Period Weeks    Status On-going   05/30/20: 39/56   Target Date 07/11/20      PT LONG TERM GOAL #6   Title Patient to score <12 sec on 5xSTS without use of UEs in order to decrease risk of falls.    Time 6    Period Weeks    Status On-going   05/30/20: 39.2  sec without cushion and with B UEs on thighs   Target Date 07/11/20                 Plan - 06/27/20 0903    Clinical Impression Statement Tiaria reporting mild pain in anterior R hip/groin today but does not wish to focus on manual therapy or DN to address this but  would prefer to focus on strengthening with core emphasis. Progressed quadruped strengthening and cores exercises seated on Pball - initially had some difficulty achieving balance on ball in sitting but improved as exercises progressed. Nuri is pleased with the variety of treatment interventions provided and feels that she is improving with PT.    Comorbidities tremor, HA, depression, cervical CA, aniety, anemia, bladder neurostimulator, L foot surgery 2006    Rehab Potential Good    PT Frequency 2x / week    PT Duration 6 weeks    PT Treatment/Interventions ADLs/Self Care Home Management;Cryotherapy;Moist Heat;Balance training;Therapeutic exercise;Therapeutic activities;Functional mobility training;Stair training;Gait training;DME Instruction;Neuromuscular re-education;Patient/family education;Manual techniques;Taping;Energy conservation;Dry needling;Passive range of motion    PT Next Visit Plan lumbopelvic ROM, R hip and L HS strengthening, balance training without SPC- focus on turning and dynamic SLS activities    Consulted and Agree with Plan of Care Patient           Patient will benefit from skilled therapeutic intervention in order to improve the following deficits and impairments:  Abnormal gait, Hypomobility, Decreased activity tolerance, Decreased strength, Increased fascial restricitons, Pain, Decreased balance, Difficulty walking, Increased muscle spasms, Improper body mechanics, Decreased range of motion, Impaired flexibility, Postural dysfunction  Visit Diagnosis: Obstetric damage to pelvic joints and ligaments  Muscle weakness (generalized)  Difficulty in walking, not elsewhere classified  Other symptoms and signs involving the musculoskeletal system  Unsteadiness on feet     Problem List Patient Active Problem List   Diagnosis Date Noted  . Seasonal allergies 03/10/2019  . Vitamin D deficiency 03/10/2019  . CKD (chronic kidney disease) stage 2, GFR 60-89 ml/min  09/08/2018  . Sacroiliitis ([place]) 05/09/2018  . Edema 05/09/2018  . Abdominal pain 08/25/2017  . Chronic migraine 07/26/2017  . Chronic low back pain without sciatica 07/26/2017  . Gait abnormality 07/26/2017  . Pain of left calf 10/25/2014  . History of treatment by mental health professional 10/02/2014  . Atypical chest pain 08/09/2014  . Routine general medical examination at a health care facility 08/09/2014  . Constipation, postoperative 11/19/2013  . Pelvic pain in female 11/17/2013  . Back pain 02/03/2012    Percival Spanish, PT, MPT 06/27/2020, 11:47 AM  [facility] [address]  [place], [place], [address] Phone: [phone]   Fax:  [phone]  Name: Aubriee Szeto MRN: [id number] Date of Birth: [birth year]

## 2020-07-02 ENCOUNTER — Ambulatory Visit: Payer: 59

## 2020-07-09 ENCOUNTER — Ambulatory Visit: Payer: 59

## 2020-07-09 ENCOUNTER — Other Ambulatory Visit: Payer: Self-pay

## 2020-07-09 DIAGNOSIS — R2681 Unsteadiness on feet: Secondary | ICD-10-CM

## 2020-07-09 DIAGNOSIS — R29898 Other symptoms and signs involving the musculoskeletal system: Secondary | ICD-10-CM

## 2020-07-09 DIAGNOSIS — M6281 Muscle weakness (generalized): Secondary | ICD-10-CM

## 2020-07-09 DIAGNOSIS — R262 Difficulty in walking, not elsewhere classified: Secondary | ICD-10-CM

## 2020-07-09 NOTE — Therapy (Signed)
[facility] [address]  [place], [place], [address] Phone: [phone]   Fax:  [phone]  Physical Therapy Treatment  Patient Details  Name: Megan Gonzalez MRN: [id number] Date of Birth: 07/03/1971 Referring Provider (PT): Riki Sheer, DO   Encounter Date: 07/09/2020   PT End of Session - 07/09/20 0854    Visit Number 18    Number of Visits 21    Date for PT Re-Evaluation 07/11/20    Authorization Type UHC & Medicare    PT Start Time 0849    PT Stop Time 0927    PT Time Calculation (min) 38 min    Activity Tolerance Patient tolerated treatment well    Behavior During Therapy [facility] for tasks assessed/performed           Past Medical History:  Diagnosis Date  . Anemia   . Anxiety   . Arthritis    chronic low back pain  . Cancer ([place])    cervical cancer; no more uterus  . Depression   . Fecal incontinence   . Headache(784.0)   . Numbness   . Overactive bladder   . Sleep apnea    CPAP-non compliant  . Tremor     Past Surgical History:  Procedure Laterality Date  . ABDOMINAL HYSTERECTOMY    . APPENDECTOMY  08/25/2017  . bladder neurostimulator    . COLONOSCOPY  1999  . DILATION AND CURETTAGE OF UTERUS    . FOOT SURGERY  2006   left  . INCISION AND DRAINAGE ABSCESS N/A 11/19/2013   Procedure: POSTERIOR CULPOTOMY (INCISION AND DRAINAGE OF POST OPERATIVE CUFF HEMATOMA);  Surgeon: Jonnie Kind, MD;  Location: [facility];  Service: Gynecology;  Laterality: N/A;  . LAPAROSCOPIC APPENDECTOMY N/A 08/26/2017   Procedure: APPENDECTOMY LAPAROSCOPIC;  Surgeon: Jackolyn Confer, MD;  Location: [facility];  Service: General;  Laterality: N/A;  . PUBOVAGINAL SLING  03/24/2012   Procedure: Gaynelle Arabian;  Surgeon: Ailene Rud, MD;  Location: [facility];  Service: Urology;  Laterality: N/A;  lynx with Chemical engineer   . REMOVAL OF URINARY SLING    . SVD     x 3  . TONSILLECTOMY  as child  .  URETERAL EXPLORATION  5./30/13  . VAGINAL HYSTERECTOMY N/A 1/2/015   [employer], Dr Linus Mako and Dr Leory Plowman, with release of Pubovag sling    There were no vitals filed for this visit.   Subjective Assessment - 07/09/20 0852    Subjective Pt. noting she had to cancel last few visit due to taking care of her child at home.  MD thinking pt.'s son had asthma flare-up.    Pertinent History tremor, HA, depression, cervical CA, aniety, anemia, bladder neurostimulator, L foot surgery 2006    Diagnostic tests none recent    Patient Stated Goals improve pain    Currently in Pain? Yes    Pain Score 6     Pain Location Groin    Pain Orientation Right;Anterior;Medial    Pain Descriptors / Indicators Aching    Multiple Pain Sites No                             [facility] Adult PT Treatment/Exercise - 07/09/20 0001      Lumbar Exercises: Stretches   Hip Flexor Stretch Right;1 rep;30 seconds    Hip Flexor Stretch Limitations mod thomas + strap  Lumbar Exercises: Aerobic   Nustep L5 x 6 min (UEs/LEs)      Lumbar Exercises: Seated   Long Arc Quad on [place] Right;Left;10 reps    LAQ on [place] Limitations green Pball    Hip Flexion on Ball Right;Left;10 reps;Strengthening    Hip Flexion on Ball Limitations Alternating march + abdom. bracing seated on green p-ball     Other Seated Lumbar Exercises Seated green p-ball red TB double arm row x 10 and straight arm shoulder extension x 10      Lumbar Exercises: Supine   Dead Bug 10 reps;3 seconds    Dead Bug Limitations LE only from hook lying      Knee/Hip Exercises: Stretches   Hip Flexor Stretch Right;2 reps;30 seconds    Hip Flexor Stretch Limitations seated on chair in half lunge     Other Knee/Hip Stretches Seated B groin stretch leaning forwards 2 x 30 sec                   PT Education - 07/09/20 0944    Education Details HEP update    Person(s) Educated Patient    Methods Explanation;Demonstration;Verbal  cues;Handout    Comprehension Verbalized understanding;Returned demonstration;Verbal cues required            PT Short Term Goals - 05/30/20 1143      PT SHORT TERM GOAL #1   Title Patient to be independent with initial HEP.    Time 3    Period Weeks    Status Achieved    Target Date 05/08/20             PT Long Term Goals - 05/30/20 1144      PT LONG TERM GOAL #1   Title Patient to be independent with advanced HEP.    Time 6    Period Weeks    Status Partially Met   met for current   Target Date 07/11/20      PT LONG TERM GOAL #2   Title Patient to demonstrate B LE strength >/=4+/5.    Time 6    Period Weeks    Status Partially Met   improvement in B hip flexion, abduction, adduction, ankle plantarflexion, R hip ER, and R knee flexion   Target Date 07/11/20      PT LONG TERM GOAL #3   Title Patient to demonstrate lumbar AROM WFL and without pain limiting.    Time 6    Period Weeks    Status Partially Met   no pain, but still with limitations most in flexion and extension   Target Date 07/11/20      PT LONG TERM GOAL #4   Title Patient to report tolerance for 1 hour of upright sitting without use of additional cushion without pain limiting.    Time 6    Period Weeks    Status On-going   notes tolerance for 10 minutes sitting on cushion, and 5 miniutes without   Target Date 07/11/20      PT LONG TERM GOAL #5   Title Patient to score >45/56 on Berg to decrease risk of falls.    Time 6    Period Weeks    Status On-going   05/30/20: 39/56   Target Date 07/11/20      PT LONG TERM GOAL #6   Title Patient to score <12 sec on 5xSTS without use of UEs in order to decrease risk of falls.    Time  6    Period Weeks    Status On-going   05/30/20: 39.2 sec without cushion and with B UEs on thighs   Target Date 07/11/20                 Plan - 07/09/20 0857    Clinical Impression Statement Pt. noting she feels her pain has improved by 60% since starting  therapy.  Reporting she wishes to continue with physical therapy if possible after current POC.  Tolerated core progression well today.  Primary concern was R hip flexor/groin pain which seemed to improve with proximal hip stretching.  Ended visit with pt. reporting she was pain free.    Comorbidities tremor, HA, depression, cervical CA, aniety, anemia, bladder neurostimulator, L foot surgery 2006    Rehab Potential Good    PT Frequency 2x / week    PT Duration 6 weeks    PT Treatment/Interventions ADLs/Self Care Home Management;Cryotherapy;Moist Heat;Balance training;Therapeutic exercise;Therapeutic activities;Functional mobility training;Stair training;Gait training;DME Instruction;Neuromuscular re-education;Patient/family education;Manual techniques;Taping;Energy conservation;Dry needling;Passive range of motion    PT Next Visit Plan lumbopelvic ROM, R hip and L HS strengthening, balance training without SPC- focus on turning and dynamic SLS activities    Consulted and Agree with Plan of Care Patient           Patient will benefit from skilled therapeutic intervention in order to improve the following deficits and impairments:  Abnormal gait, Hypomobility, Decreased activity tolerance, Decreased strength, Increased fascial restricitons, Pain, Decreased balance, Difficulty walking, Increased muscle spasms, Improper body mechanics, Decreased range of motion, Impaired flexibility, Postural dysfunction  Visit Diagnosis: Obstetric damage to pelvic joints and ligaments  Muscle weakness (generalized)  Difficulty in walking, not elsewhere classified  Other symptoms and signs involving the musculoskeletal system  Unsteadiness on feet     Problem List Patient Active Problem List   Diagnosis Date Noted  . Seasonal allergies 03/10/2019  . Vitamin D deficiency 03/10/2019  . CKD (chronic kidney disease) stage 2, GFR 60-89 ml/min 09/08/2018  . Sacroiliitis ([place]) 05/09/2018  . Edema 05/09/2018   . Abdominal pain 08/25/2017  . Chronic migraine 07/26/2017  . Chronic low back pain without sciatica 07/26/2017  . Gait abnormality 07/26/2017  . Pain of left calf 10/25/2014  . History of treatment by mental health professional 10/02/2014  . Atypical chest pain 08/09/2014  . Routine general medical examination at a health care facility 08/09/2014  . Constipation, postoperative 11/19/2013  . Pelvic pain in female 11/17/2013  . Back pain 02/03/2012    Bess Harvest, PTA 07/09/20 12:35 PM   [facility] [address]  [place], [place], [address] Phone: [phone]   Fax:  [phone]  Name: Megan Gonzalez MRN: [id number] Date of Birth: 1971/02/17

## 2020-07-11 ENCOUNTER — Other Ambulatory Visit: Payer: Self-pay | Admitting: Family Medicine

## 2020-07-11 ENCOUNTER — Other Ambulatory Visit: Payer: Self-pay

## 2020-07-11 ENCOUNTER — Encounter: Payer: Self-pay | Admitting: Physical Therapy

## 2020-07-11 ENCOUNTER — Ambulatory Visit: Payer: 59 | Admitting: Physical Therapy

## 2020-07-11 DIAGNOSIS — E559 Vitamin D deficiency, unspecified: Secondary | ICD-10-CM

## 2020-07-11 DIAGNOSIS — R262 Difficulty in walking, not elsewhere classified: Secondary | ICD-10-CM

## 2020-07-11 DIAGNOSIS — M6281 Muscle weakness (generalized): Secondary | ICD-10-CM

## 2020-07-11 DIAGNOSIS — R2681 Unsteadiness on feet: Secondary | ICD-10-CM

## 2020-07-11 DIAGNOSIS — R29898 Other symptoms and signs involving the musculoskeletal system: Secondary | ICD-10-CM

## 2020-07-11 NOTE — Therapy (Signed)
[facility] [address]  [place], [place], [address] Phone: [phone]   Fax:  [phone]  Physical Therapy Progress Note  Patient Details  Name: Megan Gonzalez MRN: [id number] Date of Birth: 01/05/1971 Referring Provider (PT): Riki Sheer, DO   Progress Note Reporting Period 06/04/20 to 07/11/20  See note below for Objective Data and Assessment of Progress/Goals.     Encounter Date: 07/11/2020   PT End of Session - 07/11/20 0943    Visit Number 19    Number of Visits 27    Date for PT Re-Evaluation 08/22/20    Authorization Type UHC & Medicare    PT Start Time 0849    PT Stop Time 0937    PT Time Calculation (min) 48 min    Equipment Utilized During Treatment Gait belt    Activity Tolerance Patient tolerated treatment well    Behavior During Therapy WFL for tasks assessed/performed           Past Medical History:  Diagnosis Date  . Anemia   . Anxiety   . Arthritis    chronic low back pain  . Cancer ([place])    cervical cancer; no more uterus  . Depression   . Fecal incontinence   . Headache(784.0)   . Numbness   . Overactive bladder   . Sleep apnea    CPAP-non compliant  . Tremor     Past Surgical History:  Procedure Laterality Date  . ABDOMINAL HYSTERECTOMY    . APPENDECTOMY  08/25/2017  . bladder neurostimulator    . COLONOSCOPY  1999  . DILATION AND CURETTAGE OF UTERUS    . FOOT SURGERY  2006   left  . INCISION AND DRAINAGE ABSCESS N/A 11/19/2013   Procedure: POSTERIOR CULPOTOMY (INCISION AND DRAINAGE OF POST OPERATIVE CUFF HEMATOMA);  Surgeon: Jonnie Kind, MD;  Location: [facility];  Service: Gynecology;  Laterality: N/A;  . LAPAROSCOPIC APPENDECTOMY N/A 08/26/2017   Procedure: APPENDECTOMY LAPAROSCOPIC;  Surgeon: Jackolyn Confer, MD;  Location: [facility];  Service: General;  Laterality: N/A;  . PUBOVAGINAL SLING  03/24/2012   Procedure: Gaynelle Arabian;  Surgeon: Ailene Rud, MD;  Location: [facility];  Service: Urology;  Laterality: N/A;  lynx with [employer]   . REMOVAL OF URINARY SLING    . SVD     x 3  . TONSILLECTOMY  as child  . URETERAL EXPLORATION  5./30/13  . VAGINAL HYSTERECTOMY N/A 1/2/015   [employer], Dr Linus Mako and Dr Leory Plowman, with release of Pubovag sling    There were no vitals filed for this visit.   Subjective Assessment - 07/11/20 0852    Subjective Has been doing well. Had to take Ibuprofen yesterday and today d/t pain, however was previously taking up to 3 a day. Got good benefit from stretching last session. Reports 50-60% improvement with therapy thus far. Has been thinking about walking on the track for exercise but is worried about falling.    Pertinent History tremor, HA, depression, cervical CA, aniety, anemia, bladder neurostimulator, L foot surgery 2006    Diagnostic tests none recent    Patient Stated Goals improve pain    Currently in Pain? No/denies              [facility] PT Assessment - 07/11/20 0001      Assessment   Medical Diagnosis Sacroiliitis    Referring Provider (PT) Riki Sheer, DO    Onset Date/Surgical Date --  2011     AROM   Lumbar Flexion distal shin    Lumbar Extension mildly limited    Lumbar - Right Side Bend proximal shin    Lumbar - Left Side Bend jt line    Lumbar - Right Rotation mildly limited    Lumbar - Left Rotation mildly limited      Strength   Right Hip Flexion 5/5    Right Hip External Rotation  4/5    Right Hip Internal Rotation 4/5    Right Hip ABduction 4+/5    Right Hip ADduction 4/5    Left Hip Flexion 5/5    Left Hip External Rotation 4/5    Left Hip Internal Rotation 4/5    Left Hip ABduction 4/5    Left Hip ADduction 4+/5    Right Knee Flexion 4+/5    Right Knee Extension 5/5    Left Knee Flexion 4+/5    Left Knee Extension 5/5    Right Ankle Dorsiflexion 4+/5    Right Ankle Plantar Flexion 4/5    Left Ankle Dorsiflexion 4+/5    Left  Ankle Plantar Flexion 4/5      Standardized Balance Assessment   Standardized Balance Assessment Five Times Sit to Stand;Berg Balance Test    Five times sit to stand comments  trial 1: 24.19 sec with B UEs on thighs, no cushion      Berg Balance Test   Sit to Stand Able to stand  independently using hands    Standing Unsupported Able to stand safely 2 minutes    Sitting with Back Unsupported but Feet Supported on Floor or Stool Able to sit safely and securely 2 minutes    Stand to Sit Sits safely with minimal use of hands    Transfers Able to transfer safely, minor use of hands    Standing Unsupported with Eyes Closed Able to stand 10 seconds safely    Standing Unsupported with Feet Together Able to place feet together independently and stand for 1 minute with supervision    From Standing, Reach Forward with Outstretched Arm Can reach forward >12 cm safely (5")    From Standing Position, Pick up Object from Floor Able to pick up shoe, needs supervision    From Standing Position, Turn to Look Behind Over each Shoulder Looks behind from both sides and weight shifts well    Turn 360 Degrees Able to turn 360 degrees safely but slowly    Standing Unsupported, Alternately Place Feet on Step/Stool Able to stand independently and safely and complete 8 steps in 20 seconds    Standing Unsupported, One Foot in [employer] balance while stepping or standing    Standing on One Leg Tries to lift leg/unable to hold 3 seconds but remains standing independently    Total Score 43                         [facility] Adult PT Treatment/Exercise - 07/11/20 0001      Ambulation/Gait   Ambulation/Gait Yes    Ambulation Distance (Feet) 90 Feet    Assistive device --   B walking poles   Gait Pattern Step-through pattern;Trunk flexed;Lateral hip instability;Decreased stance time - right;Narrow base of support    Ambulation Surface Level;Indoor    Gait velocity decreased    Gait Comments manual and  verbal cueing for proper AD sequencing  PT Education - 07/11/20 [phone]    Education Details advised patient not to use walking poles for fitness until comfortable with AD sequencing pattern and able to consistently demonstrate improved stability ; discussion on objective progress and remaining impairments    Person(s) Educated Patient    Methods Explanation;Demonstration;Tactile cues;Verbal cues    Comprehension Returned demonstration;Verbalized understanding            PT Short Term Goals - 07/11/20 0859      PT SHORT TERM GOAL #1   Title Patient to be independent with initial HEP.    Time 3    Period Weeks    Status Achieved    Target Date 05/08/20             PT Long Term Goals - 07/11/20 0859      PT LONG TERM GOAL #1   Title Patient to be independent with advanced HEP.    Time 6    Period Weeks    Status Partially Met   met for current   Target Date 08/22/20      PT LONG TERM GOAL #2   Title Patient to demonstrate B LE strength >/=4+/5.    Time 6    Period Weeks    Status Partially Met   improved in B hip flexion, R hip abduction, L hip adduction, B knee extension, L knee flexion   Target Date 08/22/20      PT LONG TERM GOAL #3   Title Patient to demonstrate lumbar AROM WFL and without pain limiting.    Time 6    Period Weeks    Status Partially Met   improved in flexion, extension, and R sidebending with no pain   Target Date 08/22/20      PT LONG TERM GOAL #4   Title Patient to report tolerance for 1 hour of upright sitting without use of additional cushion without pain limiting.    Time 6    Period Weeks    Status On-going   reports 10-15 minutes without cushion   Target Date 08/22/20      PT LONG TERM GOAL #5   Title Patient to score >45/56 on Berg to decrease risk of falls.    Time 6    Period Weeks    Status On-going   07/11/20: 43/56   Target Date 08/22/20      PT LONG TERM GOAL #6   Title Patient to score <12 sec on  5xSTS without use of UEs in order to decrease risk of falls.    Time 6    Period Weeks    Status On-going   07/11/20: 24.1 sec without cushion and with B UEs on thighs   Target Date 08/22/20                 Plan - 07/11/20 0946    Clinical Impression Statement Patient reports an overall decrease in need to use pain meds for her pain despite having to take Ibuprofen for the past 2 days. Reports 50-60% improvement with therapy thus far. Patient notes that she been thinking about walking on the track for exercise but is worried about falling. Patient denies questions with HEP. Now reporting up to 15 minutes of sitting without use of cushion before onset of pain. Berg score has improved to 43/56 and also with more than a 10 second improvement in 5x STS test today d/t improvement in transfers and standing balance. Strength testing revealed improvement in B  hip flexion, R hip abduction, L hip adduction, B knee extension, L knee flexion strength. Lumbar AROM has improved in in flexion, extension, and R sidebending, and with no pain remaining. Patient still demonstrates poor use of stepping reflex when experiencing LOB out of BOS such as when attempting SLS or tandem stance, which needs to be addressed. Worked on gait training with 2 walking poles to improve UE support/stability when walking for fitness. Patient will need to continue practicing proper AD sequencing before attempting this at home, and patient reported understanding of this. Overall patient continues to demonstrate consistent progress towards goals. Would continued to benefit form skilled PT services 1x/week for 6 weeks to address remaining goals.    Comorbidities tremor, HA, depression, cervical CA, aniety, anemia, bladder neurostimulator, L foot surgery 2006    Rehab Potential Good    PT Frequency 2x / week    PT Duration 6 weeks    PT Treatment/Interventions ADLs/Self Care Home Management;Cryotherapy;Moist Heat;Balance  training;Therapeutic exercise;Therapeutic activities;Functional mobility training;Stair training;Gait training;DME Instruction;Neuromuscular re-education;Patient/family education;Manual techniques;Taping;Energy conservation;Dry needling;Passive range of motion    PT Next Visit Plan work on reactive stepping reflex with SLS/tandem balance, gait training with 1 walking poles, lumbopelvic ROM, R hip and L HS strengthening    Consulted and Agree with Plan of Care Patient           Patient will benefit from skilled therapeutic intervention in order to improve the following deficits and impairments:  Abnormal gait, Hypomobility, Decreased activity tolerance, Decreased strength, Increased fascial restricitons, Pain, Decreased balance, Difficulty walking, Increased muscle spasms, Improper body mechanics, Decreased range of motion, Impaired flexibility, Postural dysfunction  Visit Diagnosis: Obstetric damage to pelvic joints and ligaments  Muscle weakness (generalized)  Difficulty in walking, not elsewhere classified  Other symptoms and signs involving the musculoskeletal system  Unsteadiness on feet     Problem List Patient Active Problem List   Diagnosis Date Noted  . Seasonal allergies 03/10/2019  . Vitamin D deficiency 03/10/2019  . CKD (chronic kidney disease) stage 2, GFR 60-89 ml/min 09/08/2018  . Sacroiliitis ([place]) 05/09/2018  . Edema 05/09/2018  . Abdominal pain 08/25/2017  . Chronic migraine 07/26/2017  . Chronic low back pain without sciatica 07/26/2017  . Gait abnormality 07/26/2017  . Pain of left calf 10/25/2014  . History of treatment by mental health professional 10/02/2014  . Atypical chest pain 08/09/2014  . Routine general medical examination at a health care facility 08/09/2014  . Constipation, postoperative 11/19/2013  . Pelvic pain in female 11/17/2013  . Back pain 02/03/2012     Janene Harvey, PT, DPT 07/11/20 9:56 AM   [facility] [place], [place], [address] Phone: [phone]   Fax:  [phone]  Name: Frederick Klinger MRN: [id number] Date of Birth: September 14, 1971

## 2020-07-24 ENCOUNTER — Encounter: Payer: Self-pay | Admitting: Physical Therapy

## 2020-07-24 ENCOUNTER — Ambulatory Visit: Payer: 59 | Admitting: Physical Therapy

## 2020-07-24 ENCOUNTER — Other Ambulatory Visit: Payer: Self-pay

## 2020-07-24 DIAGNOSIS — R2681 Unsteadiness on feet: Secondary | ICD-10-CM

## 2020-07-24 DIAGNOSIS — R262 Difficulty in walking, not elsewhere classified: Secondary | ICD-10-CM

## 2020-07-24 DIAGNOSIS — R29898 Other symptoms and signs involving the musculoskeletal system: Secondary | ICD-10-CM

## 2020-07-24 DIAGNOSIS — M6281 Muscle weakness (generalized): Secondary | ICD-10-CM

## 2020-07-24 NOTE — Therapy (Signed)
[facility] [address]  [place], [place], [address] Phone: [phone]   Fax:  [phone]  Physical Therapy Treatment  Patient Details  Name: Megan Gonzalez MRN: [id number] Date of Birth: 1971-04-22 Referring Provider (PT): Riki Sheer, DO   Encounter Date: 07/24/2020   PT End of Session - 07/24/20 1206    Visit Number 20    Number of Visits 27    Date for PT Re-Evaluation 08/22/20    Authorization Type UHC & Medicare    PT Start Time 0900   pt late   PT Stop Time 0931    PT Time Calculation (min) 31 min    Equipment Utilized During Treatment Gait belt    Activity Tolerance Patient tolerated treatment well    Behavior During Therapy WFL for tasks assessed/performed           Past Medical History:  Diagnosis Date   Anemia    Anxiety    Arthritis    chronic low back pain   Cancer ([place])    cervical cancer; no more uterus   Depression    Fecal incontinence    Headache(784.0)    Numbness    Overactive bladder    Sleep apnea    CPAP-non compliant   Tremor     Past Surgical History:  Procedure Laterality Date   ABDOMINAL HYSTERECTOMY     APPENDECTOMY  08/25/2017   bladder neurostimulator     COLONOSCOPY  1999   DILATION AND CURETTAGE OF UTERUS     FOOT SURGERY  2006   left   INCISION AND DRAINAGE ABSCESS N/A 11/19/2013   Procedure: POSTERIOR CULPOTOMY (INCISION AND DRAINAGE OF POST OPERATIVE CUFF HEMATOMA);  Surgeon: Jonnie Kind, MD;  Location: [facility];  Service: Gynecology;  Laterality: N/A;   LAPAROSCOPIC APPENDECTOMY N/A 08/26/2017   Procedure: APPENDECTOMY LAPAROSCOPIC;  Surgeon: Jackolyn Confer, MD;  Location: [facility];  Service: General;  Laterality: N/A;   PUBOVAGINAL SLING  03/24/2012   Procedure: Gaynelle Arabian;  Surgeon: Ailene Rud, MD;  Location: [facility];  Service: Urology;  Laterality: N/A;  lynx with [employer]    REMOVAL OF URINARY  SLING     SVD     x 3   TONSILLECTOMY  as child   URETERAL EXPLORATION  5./30/13   VAGINAL HYSTERECTOMY N/A 1/2/015   [employer], Dr Linus Mako and Dr Leory Plowman, with release of Pubovag sling    There were no vitals filed for this visit.   Subjective Assessment - 07/24/20 0901    Subjective Reports not much is new since last time. Reports that she almost fell yesterday when going up stairs with her grandchild.    Pertinent History tremor, HA, depression, cervical CA, aniety, anemia, bladder neurostimulator, L foot surgery 2006    Diagnostic tests none recent    Patient Stated Goals improve pain    Currently in Pain? No/denies                             [facility] Adult PT Treatment/Exercise - 07/24/20 0001      Neuro Re-ed    Neuro Re-ed Details  standing anterior, lateral, and posterior corrective steps with each foot with perturbations; 1/2 tandem and full tandem with multidirectional perturbations with corrective stepping      Lumbar Exercises: Aerobic   Nustep L4 x 4 min (UEs/LEs)      Knee/Hip Exercises:  Standing   Step Down Right;Left;1 set;15 reps;Hand Hold: 1;Step Height: 8"    Step Down Limitations R/L step up/down    cues to avoid posterior trunk lean to prevent LOB                   PT Short Term Goals - 07/11/20 0859      PT SHORT TERM GOAL #1   Title Patient to be independent with initial HEP.    Time 3    Period Weeks    Status Achieved    Target Date 05/08/20             PT Long Term Goals - 07/11/20 0859      PT LONG TERM GOAL #1   Title Patient to be independent with advanced HEP.    Time 6    Period Weeks    Status Partially Met   met for current   Target Date 08/22/20      PT LONG TERM GOAL #2   Title Patient to demonstrate B LE strength >/=4+/5.    Time 6    Period Weeks    Status Partially Met   improved in B hip flexion, R hip abduction, L hip adduction, B knee extension, L knee flexion   Target Date  08/22/20      PT LONG TERM GOAL #3   Title Patient to demonstrate lumbar AROM WFL and without pain limiting.    Time 6    Period Weeks    Status Partially Met   improved in flexion, extension, and R sidebending with no pain   Target Date 08/22/20      PT LONG TERM GOAL #4   Title Patient to report tolerance for 1 hour of upright sitting without use of additional cushion without pain limiting.    Time 6    Period Weeks    Status On-going   reports 10-15 minutes without cushion   Target Date 08/22/20      PT LONG TERM GOAL #5   Title Patient to score >45/56 on Berg to decrease risk of falls.    Time 6    Period Weeks    Status On-going   07/11/20: 43/56   Target Date 08/22/20      PT LONG TERM GOAL #6   Title Patient to score <12 sec on 5xSTS without use of UEs in order to decrease risk of falls.    Time 6    Period Weeks    Status On-going   07/11/20: 24.1 sec without cushion and with B UEs on thighs   Target Date 08/22/20                 Plan - 07/24/20 1207    Clinical Impression Statement Patient reporting a near fall yesterday when going up stairs and requesting to address balance today. Worked on standing perturbations to address reflexive stepping when a LOB occurs. Patient with most difficulty performing posterior and lateral corrective steps and required demonstration and verbal cueing to perform an appropriate step. Challenged balance further with tandem and  tandem with good effort to perform reflexive stepping. Proceeded working on step up/downs with patient requiring cues to avoid posterior trunk lean to prevent LOB with good carryover. Patient without complaints at end of session and progressing well towards goals.    Comorbidities tremor, HA, depression, cervical CA, aniety, anemia, bladder neurostimulator, L foot surgery 2006    Rehab Potential Good    PT  Frequency 2x / week    PT Duration 6 weeks    PT Treatment/Interventions ADLs/Self Care Home  Management;Cryotherapy;Moist Heat;Balance training;Therapeutic exercise;Therapeutic activities;Functional mobility training;Stair training;Gait training;DME Instruction;Neuromuscular re-education;Patient/family education;Manual techniques;Taping;Energy conservation;Dry needling;Passive range of motion    PT Next Visit Plan work on reactive stepping reflex with SLS/tandem balance, gait training with 1 walking poles, lumbopelvic ROM, R hip and L HS strengthening    Consulted and Agree with Plan of Care Patient           Patient will benefit from skilled therapeutic intervention in order to improve the following deficits and impairments:  Abnormal gait, Hypomobility, Decreased activity tolerance, Decreased strength, Increased fascial restricitons, Pain, Decreased balance, Difficulty walking, Increased muscle spasms, Improper body mechanics, Decreased range of motion, Impaired flexibility, Postural dysfunction  Visit Diagnosis: Obstetric damage to pelvic joints and ligaments  Muscle weakness (generalized)  Difficulty in walking, not elsewhere classified  Other symptoms and signs involving the musculoskeletal system  Unsteadiness on feet     Problem List Patient Active Problem List   Diagnosis Date Noted   Seasonal allergies 03/10/2019   Vitamin D deficiency 03/10/2019   CKD (chronic kidney disease) stage 2, GFR 60-89 ml/min 09/08/2018   Sacroiliitis ([place]) 05/09/2018   Edema 05/09/2018   Abdominal pain 08/25/2017   Chronic migraine 07/26/2017   Chronic low back pain without sciatica 07/26/2017   Gait abnormality 07/26/2017   Pain of left calf 10/25/2014   History of treatment by mental health professional 10/02/2014   Atypical chest pain 08/09/2014   Routine general medical examination at a health care facility 08/09/2014   Constipation, postoperative 11/19/2013   Pelvic pain in female 11/17/2013   Back pain 02/03/2012     Janene Harvey, PT,  DPT 07/24/20 12:09 PM   [facility] [address]  [place], [place], [address] Phone: [phone]   Fax:  [phone]  Name: Ora Bollig MRN: [id number] Date of Birth: Oct 18, 1971

## 2020-07-31 ENCOUNTER — Ambulatory Visit: Payer: 59

## 2020-08-07 ENCOUNTER — Ambulatory Visit: Payer: 59 | Attending: Family Medicine

## 2020-08-07 ENCOUNTER — Other Ambulatory Visit: Payer: Self-pay

## 2020-08-07 DIAGNOSIS — R2681 Unsteadiness on feet: Secondary | ICD-10-CM | POA: Insufficient documentation

## 2020-08-07 DIAGNOSIS — R29898 Other symptoms and signs involving the musculoskeletal system: Secondary | ICD-10-CM

## 2020-08-07 DIAGNOSIS — R262 Difficulty in walking, not elsewhere classified: Secondary | ICD-10-CM

## 2020-08-07 DIAGNOSIS — M6281 Muscle weakness (generalized): Secondary | ICD-10-CM | POA: Diagnosis present

## 2020-08-07 NOTE — Therapy (Signed)
[facility] [address]  [place], [place], [address] Phone: [phone]   Fax:  [phone]  Physical Therapy Treatment  Patient Details  Name: Megan Gonzalez MRN: [id number] Date of Birth: [birth year] Referring Provider (PT): Riki Sheer, DO   Encounter Date: 08/07/2020   PT End of Session - 08/07/20 0903    Visit Number 22    Number of Visits 27    Date for PT Re-Evaluation 08/22/20    Authorization Type UHC & Medicare    PT Start Time 0856    PT Stop Time 0928    PT Time Calculation (min) 32 min    Equipment Utilized During Treatment Gait belt    Activity Tolerance Patient tolerated treatment well    Behavior During Therapy [facility] for tasks assessed/performed           Past Medical History:  Diagnosis Date  . Anemia   . Anxiety   . Arthritis    chronic low back pain  . Cancer ([place])    cervical cancer; no more uterus  . Depression   . Fecal incontinence   . Headache(784.0)   . Numbness   . Overactive bladder   . Sleep apnea    CPAP-non compliant  . Tremor     Past Surgical History:  Procedure Laterality Date  . ABDOMINAL HYSTERECTOMY    . APPENDECTOMY  08/25/2017  . bladder neurostimulator    . COLONOSCOPY  1999  . DILATION AND CURETTAGE OF UTERUS    . FOOT SURGERY  2006   left  . INCISION AND DRAINAGE ABSCESS N/A 11/19/2013   Procedure: POSTERIOR CULPOTOMY (INCISION AND DRAINAGE OF POST OPERATIVE CUFF HEMATOMA);  Surgeon: Jonnie Kind, MD;  Location: [facility];  Service: Gynecology;  Laterality: N/A;  . LAPAROSCOPIC APPENDECTOMY N/A 08/26/2017   Procedure: APPENDECTOMY LAPAROSCOPIC;  Surgeon: Jackolyn Confer, MD;  Location: [facility];  Service: General;  Laterality: N/A;  . PUBOVAGINAL SLING  03/24/2012   Procedure: Gaynelle Arabian;  Surgeon: Ailene Rud, MD;  Location: [facility];  Service: Urology;  Laterality: N/A;  lynx with [employer]   . REMOVAL OF URINARY SLING      . SVD     x 3  . TONSILLECTOMY  as child  . URETERAL EXPLORATION  5./30/13  . VAGINAL HYSTERECTOMY N/A 1/2/015   [employer], Dr Linus Mako and Dr Leory Plowman, with release of Pubovag sling    There were no vitals filed for this visit.   Subjective Assessment - 08/07/20 0902    Subjective Feels like her pain has been low over last few weeks.    Pertinent History tremor, HA, depression, cervical CA, aniety, anemia, bladder neurostimulator, L foot surgery 2006    Diagnostic tests none recent    Patient Stated Goals improve pain    Currently in Pain? No/denies    Pain Score 0-No pain    Multiple Pain Sites No                             [facility] Adult PT Treatment/Exercise - 08/07/20 0001      Neuro Re-ed    Neuro Re-ed Details  standard stance step-back alteranting LE fall recovery hip strategies x 10 min + purterbations from therapist       Lumbar Exercises: Aerobic   Nustep L4 x 6 min (UEs/LEs)      Lumbar Exercises: Seated   Other  Seated Lumbar Exercises --      Lumbar Exercises: Supine   Bridge 10 reps;3 seconds    Bridge Limitations 3" hold       Knee/Hip Exercises: Stretches   Passive Hamstring Stretch Left;2 reps;30 seconds    Passive Hamstring Stretch Limitations supine with strap                     PT Short Term Goals - 07/11/20 0859      PT SHORT TERM GOAL #1   Title Patient to be independent with initial HEP.    Time 3    Period Weeks    Status Achieved    Target Date 05/08/20             PT Long Term Goals - 07/11/20 0859      PT LONG TERM GOAL #1   Title Patient to be independent with advanced HEP.    Time 6    Period Weeks    Status Partially Met   met for current   Target Date 08/22/20      PT LONG TERM GOAL #2   Title Patient to demonstrate B LE strength >/=4+/5.    Time 6    Period Weeks    Status Partially Met   improved in B hip flexion, R hip abduction, L hip adduction, B knee extension, L knee flexion    Target Date 08/22/20      PT LONG TERM GOAL #3   Title Patient to demonstrate lumbar AROM WFL and without pain limiting.    Time 6    Period Weeks    Status Partially Met   improved in flexion, extension, and R sidebending with no pain   Target Date 08/22/20      PT LONG TERM GOAL #4   Title Patient to report tolerance for 1 hour of upright sitting without use of additional cushion without pain limiting.    Time 6    Period Weeks    Status On-going   reports 10-15 minutes without cushion   Target Date 08/22/20      PT LONG TERM GOAL #5   Title Patient to score >45/56 on Berg to decrease risk of falls.    Time 6    Period Weeks    Status On-going   07/11/20: 43/56   Target Date 08/22/20      PT LONG TERM GOAL #6   Title Patient to score <12 sec on 5xSTS without use of UEs in order to decrease risk of falls.    Time 6    Period Weeks    Status On-going   07/11/20: 24.1 sec without cushion and with B UEs on thighs   Target Date 08/22/20                 Plan - 08/07/20 0908    Clinical Impression Statement Megan Gonzalez arrived late to session thus treatment time limited.  Somewhat down today as she has current family issues to attend to however agreeable to all balance training activities.  Progressed LOB recovery strategies with hip and ankle strategies today in standing with SPC to reduce chance of falls as pt. still notes occasional LOB.  Pt. able to perform all activities in session without pain.    Comorbidities tremor, HA, depression, cervical CA, aniety, anemia, bladder neurostimulator, L foot surgery 2006    Rehab Potential Good    PT Frequency 2x / week    PT Duration  6 weeks    PT Treatment/Interventions ADLs/Self Care Home Management;Cryotherapy;Moist Heat;Balance training;Therapeutic exercise;Therapeutic activities;Functional mobility training;Stair training;Gait training;DME Instruction;Neuromuscular re-education;Patient/family education;Manual  techniques;Taping;Energy conservation;Dry needling;Passive range of motion    PT Next Visit Plan work on reactive stepping reflex with SLS/tandem balance, gait training with 1 walking poles, lumbopelvic ROM, R hip and L HS strengthening    Consulted and Agree with Plan of Care Patient           Patient will benefit from skilled therapeutic intervention in order to improve the following deficits and impairments:  Abnormal gait, Hypomobility, Decreased activity tolerance, Decreased strength, Increased fascial restricitons, Pain, Decreased balance, Difficulty walking, Increased muscle spasms, Improper body mechanics, Decreased range of motion, Impaired flexibility, Postural dysfunction  Visit Diagnosis: Obstetric damage to pelvic joints and ligaments  Muscle weakness (generalized)  Difficulty in walking, not elsewhere classified  Other symptoms and signs involving the musculoskeletal system  Unsteadiness on feet     Problem List Patient Active Problem List   Diagnosis Date Noted  . Seasonal allergies 03/10/2019  . Vitamin D deficiency 03/10/2019  . CKD (chronic kidney disease) stage 2, GFR 60-89 ml/min 09/08/2018  . Sacroiliitis ([place]) 05/09/2018  . Edema 05/09/2018  . Abdominal pain 08/25/2017  . Chronic migraine 07/26/2017  . Chronic low back pain without sciatica 07/26/2017  . Gait abnormality 07/26/2017  . Pain of left calf 10/25/2014  . History of treatment by mental health professional 10/02/2014  . Atypical chest pain 08/09/2014  . Routine general medical examination at a health care facility 08/09/2014  . Constipation, postoperative 11/19/2013  . Pelvic pain in female 11/17/2013  . Back pain 02/03/2012    Bess Harvest, PTA 08/07/20 2:44 PM   [facility] [address]  [place], [place], [address] Phone: [phone]   Fax:  [phone]  Name: Megan Gonzalez MRN: [id number] Date of Birth:  03/29/1971

## 2020-08-14 ENCOUNTER — Encounter: Payer: Self-pay | Admitting: Physical Therapy

## 2020-08-14 ENCOUNTER — Ambulatory Visit: Payer: 59 | Admitting: Physical Therapy

## 2020-08-14 ENCOUNTER — Other Ambulatory Visit: Payer: Self-pay

## 2020-08-14 DIAGNOSIS — R2681 Unsteadiness on feet: Secondary | ICD-10-CM

## 2020-08-14 DIAGNOSIS — R29898 Other symptoms and signs involving the musculoskeletal system: Secondary | ICD-10-CM

## 2020-08-14 DIAGNOSIS — M6281 Muscle weakness (generalized): Secondary | ICD-10-CM

## 2020-08-14 DIAGNOSIS — R262 Difficulty in walking, not elsewhere classified: Secondary | ICD-10-CM

## 2020-08-14 NOTE — Therapy (Signed)
[facility] [address]  [place], [place], [address] Phone: [phone]   Fax:  [phone]  Physical Therapy Treatment  Patient Details  Name: Megan Gonzalez MRN: [id number] Date of Birth: [birth year] Referring Provider (PT): Riki Sheer, [place]   Encounter Date: 08/14/2020   PT End of Session - 08/14/20 1124    Visit Number 23    Number of Visits 27    Date for PT Re-Evaluation 08/22/20    Authorization Type UHC & Medicare    PT Start Time 0850    PT Stop Time 0929    PT Time Calculation (min) 39 min    Activity Tolerance Patient tolerated treatment well    Behavior During Therapy WFL for tasks assessed/performed           Past Medical History:  Diagnosis Date   Anemia    Anxiety    Arthritis    chronic low back pain   Cancer ([place])    cervical cancer; no more uterus   Depression    Fecal incontinence    Headache(784.0)    Numbness    Overactive bladder    Sleep apnea    CPAP-non compliant   Tremor     Past Surgical History:  Procedure Laterality Date   ABDOMINAL HYSTERECTOMY     APPENDECTOMY  08/25/2017   bladder neurostimulator     COLONOSCOPY  1999   DILATION AND CURETTAGE OF UTERUS     FOOT SURGERY  2006   left   INCISION AND DRAINAGE ABSCESS N/A 11/19/2013   Procedure: POSTERIOR CULPOTOMY (INCISION AND DRAINAGE OF POST OPERATIVE CUFF HEMATOMA);  Surgeon: Jonnie Kind, MD;  Location: [facility];  Service: Gynecology;  Laterality: N/A;   LAPAROSCOPIC APPENDECTOMY N/A 08/26/2017   Procedure: APPENDECTOMY LAPAROSCOPIC;  Surgeon: Jackolyn Confer, MD;  Location: [facility];  Service: General;  Laterality: N/A;   PUBOVAGINAL SLING  03/24/2012   Procedure: Gaynelle Arabian;  Surgeon: Ailene Rud, MD;  Location: [facility];  Service: Urology;  Laterality: N/A;  lynx with [employer]    REMOVAL OF URINARY SLING     SVD     x 3   TONSILLECTOMY  as child    URETERAL EXPLORATION  5./30/13   VAGINAL HYSTERECTOMY N/A 1/2/015   [employer], Dr Linus Mako and Dr Leory Plowman, with release of Pubovag sling    There were no vitals filed for this visit.   Subjective Assessment - 08/14/20 0851    Subjective Noting some pain shooting down her L LE- worse when sitting or bending forward that feels like nerve pain. This is new for her.    Pertinent History tremor, HA, depression, cervical CA, aniety, anemia, bladder neurostimulator, L foot surgery 2006    Diagnostic tests none recent    Patient Stated Goals improve pain    Currently in Pain? No/denies                             [facility] Adult PT Treatment/Exercise - 08/14/20 0001      Lumbar Exercises: Aerobic   Nustep L4 x 6 min (UEs/LEs)      Lumbar Exercises: Prone   Other Prone Lumbar Exercises prone on elbows 10x3" to tolerance      Knee/Hip Exercises: Stretches   Hip Flexor Stretch Right;2 reps;30 seconds    Hip Flexor Stretch Limitations mod thomas with strap   cues to avoid pulling  into pain   ITB Stretch Limitations unable to feel stretch in supine with strap- discontinued      Manual Therapy   Manual Therapy Passive ROM;Soft tissue mobilization;Myofascial release    Soft tissue mobilization STM to B proximal glutes and piriformis    Myofascial Release manual TPR to L glute and R piriformis    large painful trigger point over L proximal glutes    Passive ROM R/L ober's stretch 2x 30" each                  PT Education - 08/14/20 1124    Education Details update to HEP    Person(s) Educated Patient    Methods Explanation;Demonstration;Tactile cues;Verbal cues;Handout    Comprehension Verbalized understanding;Returned demonstration            PT Short Term Goals - 07/11/20 0859      PT SHORT TERM GOAL #1   Title Patient to be independent with initial HEP.    Time 3    Period Weeks    Status Achieved    Target Date 05/08/20             PT Long  Term Goals - 07/11/20 0859      PT LONG TERM GOAL #1   Title Patient to be independent with advanced HEP.    Time 6    Period Weeks    Status Partially Met   met for current   Target Date 08/22/20      PT LONG TERM GOAL #2   Title Patient to demonstrate B LE strength >/=4+/5.    Time 6    Period Weeks    Status Partially Met   improved in B hip flexion, R hip abduction, L hip adduction, B knee extension, L knee flexion   Target Date 08/22/20      PT LONG TERM GOAL #3   Title Patient to demonstrate lumbar AROM WFL and without pain limiting.    Time 6    Period Weeks    Status Partially Met   improved in flexion, extension, and R sidebending with no pain   Target Date 08/22/20      PT LONG TERM GOAL #4   Title Patient to report tolerance for 1 hour of upright sitting without use of additional cushion without pain limiting.    Time 6    Period Weeks    Status On-going   reports 10-15 minutes without cushion   Target Date 08/22/20      PT LONG TERM GOAL #5   Title Patient to score >45/56 on Berg to decrease risk of falls.    Time 6    Period Weeks    Status On-going   07/11/20: 43/56   Target Date 08/22/20      PT LONG TERM GOAL #6   Title Patient to score <12 sec on 5xSTS without use of UEs in order to decrease risk of falls.    Time 6    Period Weeks    Status On-going   07/11/20: 24.1 sec without cushion and with B UEs on thighs   Target Date 08/22/20                 Plan - 08/14/20 1125    Clinical Impression Statement Patient reporting new onset of pain shooting down the L LE to the calf- worse when sitting or bending forward. Worked on gentle hip stretching and thoracolumbar AROM to address pain. Patient reported  intense stretch in R hip flexors with stretching today, as she notes more tightness here recently. Unable to feel stretch with TFL self-stretch, thus this was performed passively with better success. Initiated prone press ups with patient demonstrating  good positional tolerance and ROM. Addressed soft tissue restriction with MT, with patient demonstrating large painful trigger point over L proximal glutes but with good twitch response. Updated HEP with exercises that were well-tolerated today. Discussed plans with therapy going forward- patient agreeable to transition to 30 day hold next session. No complaints at end of session.    Comorbidities tremor, HA, depression, cervical CA, aniety, anemia, bladder neurostimulator, L foot surgery 2006    Rehab Potential Good    PT Frequency 2x / week    PT Duration 6 weeks    PT Treatment/Interventions ADLs/Self Care Home Management;Cryotherapy;Moist Heat;Balance training;Therapeutic exercise;Therapeutic activities;Functional mobility training;Stair training;Gait training;DME Instruction;Neuromuscular re-education;Patient/family education;Manual techniques;Taping;Energy conservation;Dry needling;Passive range of motion    PT Next Visit Plan 30 day hold per patient's request    Consulted and Agree with Plan of Care Patient           Patient will benefit from skilled therapeutic intervention in order to improve the following deficits and impairments:  Abnormal gait, Hypomobility, Decreased activity tolerance, Decreased strength, Increased fascial restricitons, Pain, Decreased balance, Difficulty walking, Increased muscle spasms, Improper body mechanics, Decreased range of motion, Impaired flexibility, Postural dysfunction  Visit Diagnosis: Obstetric damage to pelvic joints and ligaments  Muscle weakness (generalized)  Difficulty in walking, not elsewhere classified  Other symptoms and signs involving the musculoskeletal system  Unsteadiness on feet     Problem List Patient Active Problem List   Diagnosis Date Noted   Seasonal allergies 03/10/2019   Vitamin D deficiency 03/10/2019   CKD (chronic kidney disease) stage 2, GFR 60-89 ml/min 09/08/2018   Sacroiliitis ([place]) 05/09/2018   Edema  05/09/2018   Abdominal pain 08/25/2017   Chronic migraine 07/26/2017   Chronic low back pain without sciatica 07/26/2017   Gait abnormality 07/26/2017   Pain of left calf 10/25/2014   History of treatment by mental health professional 10/02/2014   Atypical chest pain 08/09/2014   Routine general medical examination at a health care facility 08/09/2014   Constipation, postoperative 11/19/2013   Pelvic pain in female 11/17/2013   Back pain 02/03/2012     Janene Harvey, PT, DPT 08/14/20 11:31 AM   [facility] [address]  [place], [place], [address] Phone: [phone]   Fax:  [phone]  Name: Young Brim MRN: [id number] Date of Birth: [birth year]

## 2020-08-21 ENCOUNTER — Encounter: Payer: Self-pay | Admitting: Physical Therapy

## 2020-08-21 ENCOUNTER — Other Ambulatory Visit: Payer: Self-pay

## 2020-08-21 ENCOUNTER — Ambulatory Visit: Payer: 59 | Admitting: Physical Therapy

## 2020-08-21 DIAGNOSIS — R29898 Other symptoms and signs involving the musculoskeletal system: Secondary | ICD-10-CM

## 2020-08-21 DIAGNOSIS — R262 Difficulty in walking, not elsewhere classified: Secondary | ICD-10-CM

## 2020-08-21 DIAGNOSIS — M6281 Muscle weakness (generalized): Secondary | ICD-10-CM

## 2020-08-21 DIAGNOSIS — R2681 Unsteadiness on feet: Secondary | ICD-10-CM

## 2020-08-21 NOTE — Therapy (Addendum)
[facility] [address]  [place], [place], [address] Phone: [phone]   Fax:  [phone]  Physical Therapy Treatment  Patient Details  Name: Maurie Musco MRN: [id number] Date of Birth: 1971-07-19 Referring Provider (PT): Riki Sheer, DO   Progress Note Reporting Period 07/24/20 to 08/21/20  See note below for Objective Data and Assessment of Progress/Goals.     Encounter Date: 08/21/2020   PT End of Session - 08/21/20 1047    Visit Number 24    Number of Visits 27    Date for PT Re-Evaluation 08/22/20    Authorization Type UHC & Medicare    PT Start Time [phone]   pt late   PT Stop Time 0934    PT Time Calculation (min) 41 min    Equipment Utilized During Treatment Gait belt    Activity Tolerance Patient tolerated treatment well    Behavior During Therapy WFL for tasks assessed/performed           Past Medical History:  Diagnosis Date  . Anemia   . Anxiety   . Arthritis    chronic low back pain  . Cancer ([place])    cervical cancer; no more uterus  . Depression   . Fecal incontinence   . Headache(784.0)   . Numbness   . Overactive bladder   . Sleep apnea    CPAP-non compliant  . Tremor     Past Surgical History:  Procedure Laterality Date  . ABDOMINAL HYSTERECTOMY    . APPENDECTOMY  08/25/2017  . bladder neurostimulator    . COLONOSCOPY  1999  . DILATION AND CURETTAGE OF UTERUS    . FOOT SURGERY  2006   left  . INCISION AND DRAINAGE ABSCESS N/A 11/19/2013   Procedure: POSTERIOR CULPOTOMY (INCISION AND DRAINAGE OF POST OPERATIVE CUFF HEMATOMA);  Surgeon: Jonnie Kind, MD;  Location: [facility];  Service: Gynecology;  Laterality: N/A;  . LAPAROSCOPIC APPENDECTOMY N/A 08/26/2017   Procedure: APPENDECTOMY LAPAROSCOPIC;  Surgeon: Jackolyn Confer, MD;  Location: [facility];  Service: General;  Laterality: N/A;  . PUBOVAGINAL SLING  03/24/2012   Procedure: Gaynelle Arabian;  Surgeon:  Ailene Rud, MD;  Location: [facility];  Service: Urology;  Laterality: N/A;  lynx with Chemical engineer   . REMOVAL OF URINARY SLING    . SVD     x 3  . TONSILLECTOMY  as child  . URETERAL EXPLORATION  5./30/13  . VAGINAL HYSTERECTOMY N/A 1/2/015   [employer], Dr Linus Mako and Dr Leory Plowman, with release of Pubovag sling    There were no vitals filed for this visit.   Subjective Assessment - 08/21/20 0854    Subjective Still agreeable to a 30 day hold today d/t progress with therapy. "I feel good compared to when I first started." Reporting 65% improvement since starting therapy.    Pertinent History tremor, HA, depression, cervical CA, aniety, anemia, bladder neurostimulator, L foot surgery 2006    Diagnostic tests none recent    Patient Stated Goals improve pain    Currently in Pain? No/denies              [facility] PT Assessment - 08/21/20 0001      Assessment   Medical Diagnosis Sacroiliitis    Referring Provider (PT) Riki Sheer, DO    Onset Date/Surgical Date --   2011     AROM   Lumbar Flexion distal shin    Lumbar Extension mildly limited  Lumbar - Right Side Bend proximal shin    Lumbar - Left Side Bend proximal shin    Lumbar - Right Rotation WFL    Lumbar - Left Rotation mildly limited      Strength   Right Hip External Rotation  4+/5    Right Hip Internal Rotation 4+/5    Right Hip ADduction 4/5    Left Hip External Rotation 4/5    Left Hip Internal Rotation 4+/5    Left Hip ABduction 4/5    Right Ankle Plantar Flexion 4/5    Left Ankle Plantar Flexion 4+/5      Standardized Balance Assessment   Standardized Balance Assessment Five Times Sit to Stand;Berg Balance Test    Five times sit to stand comments  trial 1: 25.55 sec with B UEs on thighs, no cushion      Berg Balance Test   Sit to Stand Able to stand  independently using hands    Standing Unsupported Able to stand safely 2 minutes    Sitting with Back Unsupported but Feet Supported  on Floor or Stool Able to sit safely and securely 2 minutes    Stand to Sit Sits safely with minimal use of hands    Transfers Able to transfer safely, minor use of hands    Standing Unsupported with Eyes Closed Able to stand 10 seconds safely    Standing Unsupported with Feet Together Able to place feet together independently and stand 1 minute safely    From Standing, Reach Forward with Outstretched Arm Can reach forward >12 cm safely (5")    From Standing Position, Pick up Object from Floor Able to pick up shoe, needs supervision    From Standing Position, Turn to Look Behind Over each Shoulder Looks behind from both sides and weight shifts well    Turn 360 Degrees Able to turn 360 degrees safely but slowly    Standing Unsupported, Alternately Place Feet on Step/Stool Able to stand independently and safely and complete 8 steps in 20 seconds    Standing Unsupported, One Foot in Front Needs help to step but can hold 15 seconds    Standing on One Leg Able to lift leg independently and hold > 10 seconds    Total Score 48                         [facility] Adult PT Treatment/Exercise - 08/21/20 0001      Lumbar Exercises: Aerobic   Nustep L4 x 6 min (UEs/LEs)      Knee/Hip Exercises: Sidelying   Clams 10x each LE   cues to roll hips forward                 PT Education - 08/21/20 1045    Education Details discussion on objective progress and remaining impairments; update/consolidation to HEP including: standing hip abduction with red TB, clamshells, heel raises, sidelying TFL stretch, supine hip flexor stretch, tandem balance, SLS    Person(s) Educated Patient    Methods Explanation;Demonstration;Tactile cues;Verbal cues;Handout    Comprehension Verbalized understanding;Returned demonstration            PT Short Term Goals - 07/11/20 0859      PT SHORT TERM GOAL #1   Title Patient to be independent with initial HEP.    Time 3    Period Weeks    Status Achieved     Target Date 05/08/20  PT Long Term Goals - 08/21/20 0920      PT LONG TERM GOAL #1   Title Patient to be independent with advanced HEP.    Time 6    Period Weeks    Status Achieved      PT LONG TERM GOAL #2   Title Patient to demonstrate B LE strength >/=4+/5.    Time 6    Period Weeks    Status Partially Met   improved in R hip IR/ER, L hip IR, and L plantarflexion strength     PT LONG TERM GOAL #3   Title Patient to demonstrate lumbar AROM WFL and without pain limiting.    Time 6    Period Weeks    Status Partially Met   improved in L sidebending and R rotation     PT LONG TERM GOAL #4   Title Patient to report tolerance for 1 hour of upright sitting without use of additional cushion without pain limiting.    Time 6    Period Weeks    Status On-going   reports 15 minutes without cushion     PT LONG TERM GOAL #5   Title Patient to score >45/56 on Berg to decrease risk of falls.    Time 6    Period Weeks    Status Achieved   08/21/20: 48/56     PT LONG TERM GOAL #6   Title Patient to score <12 sec on 5xSTS without use of UEs in order to decrease risk of falls.    Time 6    Period Weeks    Status On-going   08/21/20: 25.5 sec without cushion and with B UEs on thighs                Plan - 08/21/20 1053    Clinical Impression Statement Patient overall reports 65% improvement since starting therapy. States that she is agreeable to a 30 day hold today d/t progress with therapy. "I feel good compared to when I first started." Now notes ability to tolerate 15 minutes of sitting without her cushion before onset of pain. Strength testing revealed improvement in R hip IR/ER, L hip IR, and L plantarflexion strength. Lumbar AROM was nonpainful today, and improved in L sidebending and R rotation. Patient scored 48/56 on Berg, demonstrating a significant improvement since initial assessment, and scoring out of the high fall risk category. 5xSTS score nearly  unchanged today, but still improved since initial assessment. Updated and consolidated patient's HEP for max benefit in order to continue addressing remaining impairments. Patient reported understanding of all edu provided today and without complaints at end of session. Patient has demonstrated excellent progress with therapy- not ready for 30 day hold with transition to home program.    Comorbidities tremor, HA, depression, cervical CA, aniety, anemia, bladder neurostimulator, L foot surgery 2006    Rehab Potential Good    PT Frequency 2x / week    PT Duration 6 weeks    PT Treatment/Interventions ADLs/Self Care Home Management;Cryotherapy;Moist Heat;Balance training;Therapeutic exercise;Therapeutic activities;Functional mobility training;Stair training;Gait training;DME Instruction;Neuromuscular re-education;Patient/family education;Manual techniques;Taping;Energy conservation;Dry needling;Passive range of motion    PT Next Visit Plan 30 day hold at this time    Consulted and Agree with Plan of Care Patient           Patient will benefit from skilled therapeutic intervention in order to improve the following deficits and impairments:  Abnormal gait, Hypomobility, Decreased activity tolerance, Decreased strength, Increased fascial restricitons, Pain,  Decreased balance, Difficulty walking, Increased muscle spasms, Improper body mechanics, Decreased range of motion, Impaired flexibility, Postural dysfunction  Visit Diagnosis: Obstetric damage to pelvic joints and ligaments  Muscle weakness (generalized)  Difficulty in walking, not elsewhere classified  Other symptoms and signs involving the musculoskeletal system  Unsteadiness on feet     Problem List Patient Active Problem List   Diagnosis Date Noted  . Seasonal allergies 03/10/2019  . Vitamin D deficiency 03/10/2019  . CKD (chronic kidney disease) stage 2, GFR 60-89 ml/min 09/08/2018  . Sacroiliitis ([place]) 05/09/2018  . Edema  05/09/2018  . Abdominal pain 08/25/2017  . Chronic migraine 07/26/2017  . Chronic low back pain without sciatica 07/26/2017  . Gait abnormality 07/26/2017  . Pain of left calf 10/25/2014  . History of treatment by mental health professional 10/02/2014  . Atypical chest pain 08/09/2014  . Routine general medical examination at a health care facility 08/09/2014  . Constipation, postoperative 11/19/2013  . Pelvic pain in female 11/17/2013  . Back pain 02/03/2012     Janene Harvey, PT, DPT 08/21/20 10:57 AM   [facility] [place], [place], [address] Phone: [phone]   Fax:  [phone]  Name: Jordyne Poehlman MRN: [id number] Date of Birth: [birth year]   PHYSICAL THERAPY DISCHARGE SUMMARY  Visits from Start of Care: 24  Current functional level related to goals / functional outcomes: See above clinical impression; patient did not return during 30 day hold   Remaining deficits: Increased time with transfers, pain with prolonged sitting, decreased strength, decreased lumbar AROM   Education / Equipment: HEP  Plan: Patient agrees to discharge.  Patient goals were partially met. Patient is being discharged due to being pleased with the current functional level.  ?????     Janene Harvey, PT, DPT 09/30/20 2:37 PM

## 2020-10-04 ENCOUNTER — Other Ambulatory Visit: Payer: Self-pay

## 2020-10-04 ENCOUNTER — Other Ambulatory Visit: Payer: Self-pay | Admitting: Family Medicine

## 2020-10-04 ENCOUNTER — Encounter: Payer: Self-pay | Admitting: Family Medicine

## 2020-10-04 ENCOUNTER — Ambulatory Visit (INDEPENDENT_AMBULATORY_CARE_PROVIDER_SITE_OTHER): Payer: 59 | Admitting: Family Medicine

## 2020-10-04 VITALS — BP 120/82 | HR 87 | Temp 98.3°F | Ht 62.0 in | Wt 166.0 lb

## 2020-10-04 DIAGNOSIS — J302 Other seasonal allergic rhinitis: Secondary | ICD-10-CM

## 2020-10-04 DIAGNOSIS — Z0000 Encounter for general adult medical examination without abnormal findings: Secondary | ICD-10-CM

## 2020-10-04 DIAGNOSIS — M461 Sacroiliitis, not elsewhere classified: Secondary | ICD-10-CM

## 2020-10-04 DIAGNOSIS — Z20822 Contact with and (suspected) exposure to covid-19: Secondary | ICD-10-CM

## 2020-10-04 DIAGNOSIS — R059 Cough, unspecified: Secondary | ICD-10-CM

## 2020-10-04 DIAGNOSIS — E785 Hyperlipidemia, unspecified: Secondary | ICD-10-CM

## 2020-10-04 DIAGNOSIS — E559 Vitamin D deficiency, unspecified: Secondary | ICD-10-CM

## 2020-10-04 DIAGNOSIS — Z23 Encounter for immunization: Secondary | ICD-10-CM | POA: Diagnosis not present

## 2020-10-04 DIAGNOSIS — Z1159 Encounter for screening for other viral diseases: Secondary | ICD-10-CM | POA: Diagnosis not present

## 2020-10-04 DIAGNOSIS — D649 Anemia, unspecified: Secondary | ICD-10-CM

## 2020-10-04 LAB — COMPREHENSIVE METABOLIC PANEL
ALT: 15 U/L (ref 0–35)
AST: 17 U/L (ref 0–37)
Albumin: 4.7 g/dL (ref 3.5–5.2)
Alkaline Phosphatase: 83 U/L (ref 39–117)
BUN: 11 mg/dL (ref 6–23)
CO2: 32 mEq/L (ref 19–32)
Calcium: 9.6 mg/dL (ref 8.4–10.5)
Chloride: 103 mEq/L (ref 96–112)
Creatinine, Ser: 1.27 mg/dL — ABNORMAL HIGH (ref 0.40–1.20)
GFR: 49.95 mL/min — ABNORMAL LOW (ref >60.00)
Glucose, Bld: 76 mg/dL (ref 70–99)
Potassium: 4.1 mEq/L (ref 3.5–5.1)
Sodium: 141 mEq/L (ref 135–145)
Total Bilirubin: 0.4 mg/dL (ref 0.2–1.2)
Total Protein: 6.9 g/dL (ref 6.0–8.3)

## 2020-10-04 LAB — LIPID PANEL
Cholesterol: 188 mg/dL (ref 0–200)
HDL: 39.6 mg/dL (ref >39.00)
LDL Cholesterol: 129 mg/dL — ABNORMAL HIGH (ref 0–99)
NonHDL: 148.83
Total CHOL/HDL Ratio: 5
Triglycerides: 99 mg/dL (ref 0.0–149.0)
VLDL: 19.8 mg/dL (ref 0.0–40.0)

## 2020-10-04 LAB — CBC
HCT: 41.1 % (ref 36.0–46.0)
Hemoglobin: 13.4 g/dL (ref 12.0–15.0)
MCHC: 32.5 g/dL (ref 30.0–36.0)
MCV: 81.9 fl (ref 78.0–100.0)
Platelets: 265 10*3/uL (ref 150.0–400.0)
RBC: 5.02 Mil/uL (ref 3.87–5.11)
RDW: 13.8 % (ref 11.5–15.5)
WBC: 4.3 10*3/uL (ref 4.0–10.5)

## 2020-10-04 LAB — VITAMIN D 25 HYDROXY (VIT D DEFICIENCY, FRACTURES): VITD: 54.62 ng/mL (ref 30.00–100.00)

## 2020-10-04 MED ORDER — GABAPENTIN 300 MG PO CAPS: 300.0000 mg | ORAL_CAPSULE | Freq: Three times a day (TID) | ORAL | 2 refills | Status: AC

## 2020-10-04 MED ORDER — ALBUTEROL SULFATE HFA 108 (90 BASE) MCG/ACT IN AERS: 2.0000 | INHALATION_SPRAY | RESPIRATORY_TRACT | 2 refills | Status: AC | PRN

## 2020-10-04 MED ORDER — FLUTICASONE PROPIONATE 50 MCG/ACT NA SUSP: 2.0000 | Freq: Every day | NASAL | 11 refills | Status: DC

## 2020-10-04 NOTE — Progress Notes (Signed)
Chief Complaint  Patient presents with  . Follow-up     Well Woman Megan Gonzalez is here for a complete physical.   Her last physical was >1 year ago.  Current diet: in general, a "healthy" diet. Current exercise: walking.  Weight is stable and she denies fatigue out of ordinary. Seatbelt? Yes  Health Maintenance Tetanus- No Hep C screening- No HIV screening- Yes  Past Medical History:  Diagnosis Date  . Anemia   . Anxiety   . Arthritis    chronic low back pain  . Cancer ([place])    cervical cancer; no more uterus  . Depression   . Fecal incontinence   . Headache(784.0)   . Numbness   . Overactive bladder   . Sleep apnea    CPAP-non compliant  . Tremor      Past Surgical History:  Procedure Laterality Date  . ABDOMINAL HYSTERECTOMY    . APPENDECTOMY  08/25/2017  . bladder neurostimulator    . COLONOSCOPY  1999  . DILATION AND CURETTAGE OF UTERUS    . FOOT SURGERY  2006   left  . INCISION AND DRAINAGE ABSCESS N/A 11/19/2013   Procedure: POSTERIOR CULPOTOMY (INCISION AND DRAINAGE OF POST OPERATIVE CUFF HEMATOMA);  Surgeon: Jonnie Kind, MD;  Location: [facility];  Service: Gynecology;  Laterality: N/A;  . LAPAROSCOPIC APPENDECTOMY N/A 08/26/2017   Procedure: APPENDECTOMY LAPAROSCOPIC;  Surgeon: Jackolyn Confer, MD;  Location: [facility];  Service: General;  Laterality: N/A;  . PUBOVAGINAL SLING  03/24/2012   Procedure: Gaynelle Arabian;  Surgeon: Ailene Rud, MD;  Location: [facility];  Service: Urology;  Laterality: N/A;  lynx with Chemical engineer   . REMOVAL OF URINARY SLING    . SVD     x 3  . TONSILLECTOMY  as child  . URETERAL EXPLORATION  5./30/13  . VAGINAL HYSTERECTOMY N/A 1/2/015   [name], Dr Linus Mako and Dr Leory Plowman, with release of Pubovag sling    Medications  Current Outpatient Medications on File Prior to Visit  Medication Sig Dispense Refill  . albuterol (VENTOLIN HFA) 108 (90 Base) MCG/ACT inhaler Inhale 2 puffs into the lungs  every 4 (four) hours as needed for shortness of breath. 6.7 g 2  . azelastine (ASTELIN) 0.1 % nasal spray Place 2 sprays into both nostrils 2 (two) times daily. Use in each nostril as directed 30 mL 12  . buPROPion (WELLBUTRIN XL) 300 MG 24 hr tablet take 300mg  by mouth daily  2  . estradiol (VIVELLE-DOT) 0.025 MG/24HR Place 1 patch onto the skin 2 (two) times a week.    . fluticasone (FLONASE) 50 MCG/ACT nasal spray Place 2 sprays into both nostrils daily. 16 g 2  . gabapentin (NEURONTIN) 300 MG capsule Take 1 capsule (300 mg total) by mouth 3 (three) times daily. 270 capsule 2  . prazosin (MINIPRESS) 2 MG capsule Take 2 mg by mouth at bedtime.    . rizatriptan (MAXALT-MLT) 5 MG disintegrating tablet Take 1 tablet (5 mg total) by mouth as needed. May repeat in 2 hours if needed 10 tablet 11  . Vitamin D, Ergocalciferol, (DRISDOL) 1.25 MG (50000 UNIT) CAPS capsule TAKE 1 CAPSULE BY MOUTH EVERY 7 DAYS 12 capsule 0   Allergies Allergies  Allergen Reactions  . Codeine Nausea And Vomiting  . Penicillins Other (See Comments)    Unknown- childhood reaction    Review of Systems: Constitutional:  no unexpected weight changes Eye:  no recent significant change in vision Ear/Nose/Mouth/Throat:  Ears:  no recent change in hearing Nose/Mouth/Throat:  no complaints of nasal congestion, no sore throat Cardiovascular: no chest pain Respiratory:  no shortness of breath Gastrointestinal:  no abdominal pain, no change in bowel habits GU:  Female: negative for dysuria or pelvic pain Musculoskeletal/Extremities:  No new pain of the joints Integumentary (Skin/Breast):  no abnormal skin lesions reported Neurologic:  no headaches Endocrine:  denies fatigue Hematologic/Lymphatic:  No areas of easy bleeding  Exam BP 120/82 (BP Location: Left Arm, Patient Position: Sitting, Cuff Size: Normal)   Pulse 87   Temp 98.3 F (36.8 C) (Oral)   Ht 5\' 2"  (1.575 m)   Wt 166 lb (75.3 kg)   LMP 04/24/2012   SpO2  98%   BMI 30.36 kg/m  General:  well developed, well nourished, in no apparent distress Skin:  no significant moles, warts, or growths Head:  no masses, lesions, or tenderness Eyes:  pupils equal and round, sclera anicteric without injection Ears:  canals without lesions, TMs shiny without retraction, no obvious effusion, no erythema Nose:  nares patent, septum midline, mucosa normal, and no drainage or sinus tenderness Throat/Pharynx:  lips and gingiva without lesion; tongue and uvula midline; non-inflamed pharynx; no exudates or postnasal drainage Neck: neck supple without adenopathy, thyromegaly, or masses Lungs:  clear to auscultation, breath sounds equal bilaterally, no respiratory distress Cardio:  regular rate and rhythm, no LE edema Abdomen:  abdomen soft, nontender; bowel sounds normal; no masses or organomegaly Genital: Defer to GYN Musculoskeletal:  symmetrical muscle groups noted without atrophy or deformity Extremities:  no clubbing, cyanosis, or edema, no deformities, no skin discoloration Neuro:  gait normal; deep tendon reflexes normal and symmetric Psych: well oriented with normal range of affect and appropriate judgment/insight  Assessment and Plan  Well adult exam  Vitamin D deficiency  Encounter for hepatitis C screening test for low risk patient   Well 49 y.o. female. Counseled on diet and exercise. Other orders as above. Follow up in 6 mo or prn. The patient voiced understanding and agreement to the plan.  Ben [place], DO 10/04/20 8:34 AM

## 2020-10-04 NOTE — Addendum Note (Signed)
Addended by: Sharon Seller B on: 10/04/2020 09:07 AM   Modules accepted: Orders

## 2020-10-04 NOTE — Patient Instructions (Signed)
Give us 2-3 business days to get the results of your labs back.   Keep the diet clean and stay active.  Let us know if you need anything. 

## 2020-10-07 LAB — HEPATITIS C ANTIBODY
Hepatitis C Ab: NONREACTIVE
SIGNAL TO CUT-OFF: 0.02 (ref <1.00)

## 2020-10-26 ENCOUNTER — Other Ambulatory Visit: Payer: Self-pay | Admitting: Family Medicine

## 2020-10-26 DIAGNOSIS — E559 Vitamin D deficiency, unspecified: Secondary | ICD-10-CM

## 2021-04-01 ENCOUNTER — Other Ambulatory Visit: Payer: Self-pay

## 2021-04-01 ENCOUNTER — Ambulatory Visit (INDEPENDENT_AMBULATORY_CARE_PROVIDER_SITE_OTHER): Payer: 59 | Admitting: Family Medicine

## 2021-04-01 ENCOUNTER — Encounter: Payer: Self-pay | Admitting: Family Medicine

## 2021-04-01 VITALS — BP 108/76 | HR 84 | Temp 98.2°F | Ht 62.0 in | Wt 168.0 lb

## 2021-04-01 DIAGNOSIS — E785 Hyperlipidemia, unspecified: Secondary | ICD-10-CM

## 2021-04-01 DIAGNOSIS — J302 Other seasonal allergic rhinitis: Secondary | ICD-10-CM | POA: Diagnosis not present

## 2021-04-01 DIAGNOSIS — J4530 Mild persistent asthma, uncomplicated: Secondary | ICD-10-CM | POA: Diagnosis not present

## 2021-04-01 DIAGNOSIS — G43809 Other migraine, not intractable, without status migrainosus: Secondary | ICD-10-CM

## 2021-04-01 DIAGNOSIS — G8929 Other chronic pain: Secondary | ICD-10-CM

## 2021-04-01 DIAGNOSIS — Z6830 Body mass index (BMI) 30.0-30.9, adult: Secondary | ICD-10-CM | POA: Diagnosis not present

## 2021-04-01 DIAGNOSIS — R0789 Other chest pain: Secondary | ICD-10-CM

## 2021-04-01 DIAGNOSIS — E669 Obesity, unspecified: Secondary | ICD-10-CM | POA: Diagnosis not present

## 2021-04-01 LAB — LIPID PANEL
Cholesterol: 192 mg/dL (ref 0–200)
HDL: 45.4 mg/dL (ref >39.00)
LDL Cholesterol: 130 mg/dL — ABNORMAL HIGH (ref 0–99)
NonHDL: 146.74
Total CHOL/HDL Ratio: 4
Triglycerides: 86 mg/dL (ref 0.0–149.0)
VLDL: 17.2 mg/dL (ref 0.0–40.0)

## 2021-04-01 LAB — HEMOGLOBIN A1C: Hgb A1c MFr Bld: 5.9 % (ref 4.6–6.5)

## 2021-04-01 MED ORDER — LEVOCETIRIZINE DIHYDROCHLORIDE 5 MG PO TABS: 5.0000 mg | ORAL_TABLET | Freq: Every evening | ORAL | 2 refills | Status: DC

## 2021-04-01 MED ORDER — FLUTICASONE PROPIONATE 50 MCG/ACT NA SUSP: 2.0000 | Freq: Every day | NASAL | 2 refills | Status: DC

## 2021-04-01 MED ORDER — FLOVENT HFA 110 MCG/ACT IN AERO: 2.0000 | INHALATION_SPRAY | Freq: Two times a day (BID) | RESPIRATORY_TRACT | 1 refills | Status: DC

## 2021-04-01 MED ORDER — AZELASTINE HCL 0.1 % NA SOLN: 2.0000 | Freq: Two times a day (BID) | NASAL | 12 refills | Status: DC

## 2021-04-01 NOTE — Progress Notes (Signed)
Chief Complaint  Patient presents with  . Follow-up    Subjective: Patient is a 50 y.o. female here for med ck.  Allergies Taking Claritin 10 mg/d. Ran out of Astelin and Flonase. Does not think the Claritin works. Over the past year, has had intermittent chest tightness, coughing and sob. Albuterol helps. No wheezing, fevers or URI s/s's.   Migraines Taking Maxalt 10 mg prn. Using 4 or less per mo. No AE's. Does not have HA's requiring more.   Past Medical History:  Diagnosis Date  . Anemia   . Anxiety   . Arthritis    chronic low back pain  . Cancer ([place])    cervical cancer; no more uterus  . Depression   . Fecal incontinence   . Headache(784.0)   . Numbness   . Overactive bladder   . Sleep apnea    CPAP-non compliant  . Tremor     Objective: BP 108/76 (BP Location: Left Arm, Patient Position: Sitting, Cuff Size: Normal)   Pulse 84   Temp 98.2 F (36.8 C) (Oral)   Ht 5\' 2"  (1.575 m)   Wt 168 lb (76.2 kg)   LMP 04/24/2012   SpO2 98%   BMI 30.73 kg/m  General: Awake, appears stated age HEENT: MMM, EOMi, nares patent, turbinates pale and boggy bilaterally, ears negative bilaterally Heart: RRR Lungs: CTAB, no rales, wheezes or rhonchi. No accessory muscle use Psych: Age appropriate judgment and insight, normal affect and mood  Assessment and Plan: Seasonal allergies - Plan: fluticasone (FLONASE) 50 MCG/ACT nasal spray, azelastine (ASTELIN) 0.1 % nasal spray, levocetirizine (XYZAL) 5 MG tablet  Mild persistent extrinsic asthma without complication - Plan: levocetirizine (XYZAL) 5 MG tablet, fluticasone (FLOVENT HFA) 110 MCG/ACT inhaler  Other migraine without status migrainosus, not intractable  Class 1 obesity without serious comorbidity with body mass index (BMI) of 30.0 to 30.9 in adult, unspecified obesity type - Plan: Amb Ref to Medical Weight Management, Hemoglobin A1c  Chronic chest wall pain  Dyslipidemia - Plan: Lipid panel  1.  Refill Flonase,  change Claritin to Xyzal.  Restart Astelin spray. 2.  Continue albuterol.  Add inhaled corticosteroid.  Rinse out mouth after usage. 3.  Continue Maxalt as needed. 4. Refer to the medical weight loss team.  Counseled on adding weight resistance exercise. 5.  Stretches and exercises provided.  PT if no better in the next 4 weeks. 6.  Check cholesterol. Follow-up in 6 months for physical or as needed. The patient voiced understanding and agreement to the plan.  [place], DO 04/01/21  10:44 AM

## 2021-04-01 NOTE — Patient Instructions (Addendum)
If you do not hear anything about your referral in the next 1-2 weeks, call our office and ask for an update.  Heat (pad or rice pillow in microwave) over affected area, 10-15 minutes twice daily.   Ice/cold pack over area for 10-15 min twice daily.  Claritin (loratadine), Allegra (fexofenadine), Zyrtec (cetirizine) which is also equivalent to Xyzal (levocetirizine); these are listed in order from weakest to strongest. Generic, and therefore cheaper, options are in the parentheses.   Flonase (fluticasone); nasal spray that is over the counter. 2 sprays each nostril, once daily. Aim towards the same side eye when you spray.  There are available OTC, and the generic versions, which may be cheaper, are in parentheses. Show this to a pharmacist if you have trouble finding any of these items.  Send me a message in 4 weeks if no better with the chest pain.  Let us know if you need anything.   Pectoralis Major Rehab Ask your health care provider which exercises are safe for you. Do exercises exactly as told by your health care provider and adjust them as directed. It is normal to feel mild stretching, pulling, tightness, or discomfort as you do these exercises, but you should stop right away if you feel sudden pain or your pain gets worse.Do not begin these exercises until told by your health care provider. Stretching and range of motion exercises These exercises warm up your muscles and joints and improve the movement and flexibility of your shoulder. These exercises can also help to relieve pain, numbness, and tingling. Exercise A: Pendulum  1. Stand near a wall or a surface that you can hold onto for balance. 2. Bend at the waist and let your left / right arm hang straight down. Use your other arm to keep your balance. 3. Relax your arm and shoulder muscles, and move your hips and your trunk so your left / right arm swings freely. Your arm should swing because of the motion of your body, not  because you are using your arm or shoulder muscles. 4. Keep moving so your arm swings in the following directions, as told by your health care provider: ? Side to side. ? Forward and backward. ? In clockwise and counterclockwise circles. 5. Slowly return to the starting position. Repeat 2 times. Complete this exercise 3 times per week. Exercise B: Abduction, standing 1. Stand and hold a broomstick, a cane, or a similar object. Place your hands a little more than shoulder-width apart on the object. Your left / right hand should be palm-up, and your other hand should be palm-down. 2. While keeping your elbow straight and your shoulder muscles relaxed, push the stick across your body toward your left / right side. Raise your left / right arm to the side of your body and then over your head until you feel a stretch in your shoulder. ? Stop when you reach the angle that is recommended by your health care provider. ? Avoid shrugging your shoulder while you raise your arm. Keep your shoulder blade tucked down toward the middle of your spine. 3. Hold for 10 seconds. 4. Slowly return to the starting position. Repeat 2 times. Complete this exercise 3 times per week. Exercise C: Wand flexion, supine  1. Lie on your back. You may bend your knees for comfort. 2. Hold a broomstick, a cane, or a similar object so that your hands are about shoulder-width apart on the object. Your palms should face toward your feet. 3. Raise  your left / right arm in front of your face, then behind your head (toward the floor). Use your other hand to help you do this. Stop when you feel a gentle stretch in your shoulder, or when you reach the angle that is recommended by your health care provider. 4. Hold for 3 seconds. 5. Use the broomstick and your other arm to help you return your left / right arm to the starting position. Repeat 2 times. Complete this exercise 3 times per week. Exercise D: Wand shoulder external  rotation 1. Stand and hold a broomstick, a cane, or a similar object so your handsare about shoulder-width apart on the object. 2. Start with your arms hanging down, then bend both elbows to an "L" shape (90 degrees). 3. Keep your left / right elbow at your side. Use your other hand to push the stick so your left / right forearm moves away from your body, out to your side. ? Keep your left / right elbow bent to 90 degrees and keep it against your side. ? Stop when you feel a gentle stretch in your shoulder, or when you reach the angle recommended by your health care provider. 4. Hold for 10 seconds. 5. Use the stick to help you return your left / right arm to the starting position. Repeat 2 times. Complete this exercise 3 times per week. Strengthening exercises These exercises build strength and endurance in your shoulder. Endurance is the ability to use your muscles for a long time, even after your muscles get tired. Exercise E: Scapular protraction, standing 1. Stand so you are facing a wall. Place your feet about one arm-length away from the wall. 2. Place your hands on the wall and straighten your elbows. 3. Keep your hands on the wall as you push your upper back away from the wall. You should feel your shoulder blades sliding forward.Keep your elbows and your head still. ? If you are not sure that you are doing this exercise correctly, ask your health care provider for more instructions. 4. Hold for 3 seconds. 5. Slowly return to the starting position. Let your muscles relax completely before you repeat this exercise. Repeat 2 times. Complete this exercise 3 times per week. Exercise F: Shoulder blade squeezes  (scapular retraction) 1. Sit with good posture in a stable chair. Do not let your back touch the back of the chair. 2. Your arms should be at your sides with your elbows bent. You may rest your forearms on a pillow if that is more comfortable. 3. Squeeze your shoulder blades  together. Bring them down and back. ? Keep your shoulders level. ? Do not lift your shoulders up toward your ears. 4. Hold for 3 seconds. 5. Return to the starting position. Repeat 2 times. Complete this exercise 3 times per week. This information is not intended to replace advice given to you by your health care provider. Make sure you discuss any questions you have with your health care provider. Document Released: 10/12/2005 Document Revised: 07/23/2016 Document Reviewed: 06/30/2015 Elsevier Interactive Patient Education  [employer].

## 2021-04-04 ENCOUNTER — Ambulatory Visit: Payer: Medicare Other | Admitting: Family Medicine

## 2021-06-27 ENCOUNTER — Encounter: Payer: Self-pay | Admitting: Family

## 2021-06-27 ENCOUNTER — Ambulatory Visit (INDEPENDENT_AMBULATORY_CARE_PROVIDER_SITE_OTHER): Payer: 59 | Admitting: Family

## 2021-06-27 ENCOUNTER — Other Ambulatory Visit: Payer: Self-pay

## 2021-06-27 VITALS — Ht 62.0 in | Wt 164.6 lb

## 2021-06-27 DIAGNOSIS — G8929 Other chronic pain: Secondary | ICD-10-CM

## 2021-06-27 DIAGNOSIS — M545 Low back pain, unspecified: Secondary | ICD-10-CM

## 2021-06-27 MED ORDER — KETOROLAC TROMETHAMINE 30 MG/ML IJ SOLN
30.0000 mg | Freq: Once | INTRAMUSCULAR | Status: AC
  Administered 2021-06-27: 30 mg via INTRAMUSCULAR

## 2021-06-27 MED ORDER — METHYLPREDNISOLONE ACETATE 40 MG/ML IJ SUSP
40.0000 mg | Freq: Once | INTRAMUSCULAR | Status: AC
  Administered 2021-06-27: 40 mg via INTRAMUSCULAR

## 2021-06-27 MED ORDER — IBUPROFEN 800 MG PO TABS: 800.0000 mg | ORAL_TABLET | Freq: Three times a day (TID) | ORAL | 0 refills | Status: DC | PRN

## 2021-06-27 MED ORDER — HYDROCODONE-ACETAMINOPHEN 5-325 MG PO TABS: 1.0000 | ORAL_TABLET | Freq: Four times a day (QID) | ORAL | 0 refills | Status: DC | PRN

## 2021-06-27 NOTE — Progress Notes (Signed)
Megan Gonzalez is a 50 y.o. female with the following history as recorded in [employer]:  Patient Active Problem List   Diagnosis Date Noted   Dyslipidemia 10/04/2020   Seasonal allergies 03/10/2019   Vitamin D deficiency 03/10/2019   CKD (chronic kidney disease) stage 2, GFR 60-89 ml/min 09/08/2018   Edema 05/09/2018   Abdominal pain 08/25/2017   Chronic migraine 07/26/2017   Chronic low back pain without sciatica 07/26/2017   Gait abnormality 07/26/2017   Pain of left calf 10/25/2014   History of treatment by mental health professional 10/02/2014   Atypical chest pain 08/09/2014   Routine general medical examination at a health care facility 08/09/2014   Constipation, postoperative 11/19/2013   Pelvic pain in female 11/17/2013   Back pain 02/03/2012    Current Outpatient Medications  Medication Sig Dispense Refill   albuterol (VENTOLIN HFA) 108 (90 Base) MCG/ACT inhaler Inhale 2 puffs into the lungs every 4 (four) hours as needed for shortness of breath. 6.7 g 2   azelastine (ASTELIN) 0.1 % nasal spray Place 2 sprays into both nostrils 2 (two) times daily. Use in each nostril as directed 30 mL 12   buPROPion (WELLBUTRIN XL) 300 MG 24 hr tablet take '300mg'$  by mouth daily  2   estradiol (VIVELLE-DOT) 0.025 MG/24HR Place 1 patch onto the skin 2 (two) times a week.     fluticasone (FLONASE) 50 MCG/ACT nasal spray Place 2 sprays into both nostrils daily. 48 g 2   fluticasone (FLOVENT HFA) 110 MCG/ACT inhaler Inhale 2 puffs into the lungs in the morning and at bedtime. Rinse mouth out after use. 1 each 1   gabapentin (NEURONTIN) 300 MG capsule Take 1 capsule (300 mg total) by mouth 3 (three) times daily. 270 capsule 2   levocetirizine (XYZAL) 5 MG tablet Take 1 tablet (5 mg total) by mouth every evening. 30 tablet 2   prazosin (MINIPRESS) 2 MG capsule Take 2 mg by mouth at bedtime.     rizatriptan (MAXALT-MLT) 5 MG disintegrating tablet Take 1 tablet (5 mg total) by mouth as needed.  May repeat in 2 hours if needed 10 tablet 11   HYDROcodone-acetaminophen (NORCO) 5-325 MG tablet Take 1 tablet by mouth every 6 (six) hours as needed for moderate pain. 15 tablet 0   ibuprofen (ADVIL) 800 MG tablet Take 1 tablet (800 mg total) by mouth every 8 (eight) hours as needed for moderate pain. 60 tablet 0   No current facility-administered medications for this visit.    Allergies: Codeine and Penicillins  Past Medical History:  Diagnosis Date   Anemia    Anxiety    Arthritis    chronic low back pain   Cancer ([place])    cervical cancer; no more uterus   Depression    Fecal incontinence    Headache(784.0)    Numbness    Overactive bladder    Sleep apnea    CPAP-non compliant   Tremor     Past Surgical History:  Procedure Laterality Date   ABDOMINAL HYSTERECTOMY     APPENDECTOMY  08/25/2017   bladder neurostimulator     COLONOSCOPY  1999   DILATION AND CURETTAGE OF UTERUS     FOOT SURGERY  2006   left   INCISION AND DRAINAGE ABSCESS N/A 11/19/2013   Procedure: POSTERIOR CULPOTOMY (INCISION AND DRAINAGE OF POST OPERATIVE CUFF HEMATOMA);  Surgeon: Jonnie Kind, MD;  Location: [facility];  Service: Gynecology;  Laterality: N/A;   LAPAROSCOPIC APPENDECTOMY N/A 08/26/2017  Procedure: APPENDECTOMY LAPAROSCOPIC;  Surgeon: Jackolyn Confer, MD;  Location: [facility];  Service: General;  Laterality: N/A;   PUBOVAGINAL SLING  03/24/2012   Procedure: Gaynelle Arabian;  Surgeon: Ailene Rud, MD;  Location: [facility];  Service: Urology;  Laterality: N/A;  lynx with Chemical engineer    REMOVAL OF URINARY SLING     SVD     x 3   TONSILLECTOMY  as child   URETERAL EXPLORATION  5./30/13   VAGINAL HYSTERECTOMY N/A 1/2/015   [employer], Dr Linus Mako and Dr Leory Plowman, with release of Pubovag sling    Family History  Problem Relation Age of Onset   Cancer Mother        Breast cancer   Thyroid disease Mother    Hypotension Mother    Other Father        unsure of history    Anesthesia problems Neg Hx     Social History   Tobacco Use   Smoking status: Never   Smokeless tobacco: Never  Substance Use Topics   Alcohol use: No    Subjective:  Right sided low back pain/ right sided pelvic pain; chronic low back pain; notes this episode has been going on for 1 week; s/p hysterectomy and appendectomy; She notes this is a chronic issue for her that has been present for years and she gets periodic flares; is requesting steroid injection and Toradol that her PCP typically gives her when these symptoms flare.      Objective:  Vitals:   06/27/21 1320  Weight: 164 lb 9.6 oz (74.7 kg)  Height: '5\' 2"'$  (1.575 m)    General: Well developed, well nourished, in no acute distress  Skin : Warm and dry.  Head: Normocephalic and atraumatic  Eyes: Sclera and conjunctiva clear; pupils round and reactive to light; extraocular movements intact  Ears: External normal; canals clear; tympanic membranes normal  Oropharynx: Pink, supple. No suspicious lesions  Neck: Supple without thyromegaly, adenopathy  Lungs: Respirations unlabored;  Musculoskeletal: No deformities; no active joint inflammation  Extremities: No edema, cyanosis, clubbing  Vessels: Symmetric bilaterally  Neurologic: Alert and oriented; speech intact; face symmetrical; favors right side while walking due to pain  Assessment:  1. Chronic low back pain without sciatica, unspecified back pain laterality     Plan:  Acute flare of chronic issue; she is given injection of Toradol and Depo-Medrol; refill on Ibuprofen and Norco; she may need to follow up with sports medicine or orthopedist if symptoms persist.  This visit occurred during the SARS-CoV-2 public health emergency.  Safety protocols were in place, including screening questions prior to the visit, additional usage of staff PPE, and extensive cleaning of exam room while observing appropriate contact time as indicated for disinfecting solutions.    No  follow-ups on file.  No orders of the defined types were placed in this encounter.   Requested Prescriptions   Signed Prescriptions Disp Refills   ibuprofen (ADVIL) 800 MG tablet 60 tablet 0    Sig: Take 1 tablet (800 mg total) by mouth every 8 (eight) hours as needed for moderate pain.   HYDROcodone-acetaminophen (NORCO) 5-325 MG tablet 15 tablet 0    Sig: Take 1 tablet by mouth every 6 (six) hours as needed for moderate pain.

## 2021-07-02 ENCOUNTER — Other Ambulatory Visit: Payer: Self-pay | Admitting: Family Medicine

## 2021-09-04 ENCOUNTER — Telehealth (INDEPENDENT_AMBULATORY_CARE_PROVIDER_SITE_OTHER): Payer: 59 | Admitting: Registered Nurse

## 2021-09-04 DIAGNOSIS — J069 Acute upper respiratory infection, unspecified: Secondary | ICD-10-CM | POA: Diagnosis not present

## 2021-09-04 DIAGNOSIS — B9689 Other specified bacterial agents as the cause of diseases classified elsewhere: Secondary | ICD-10-CM | POA: Diagnosis not present

## 2021-09-04 MED ORDER — PREDNISONE 10 MG (21) PO TBPK: ORAL_TABLET | ORAL | 0 refills | Status: DC

## 2021-09-04 MED ORDER — DM-GUAIFENESIN ER 30-600 MG PO TB12
1.0000 | ORAL_TABLET | Freq: Two times a day (BID) | ORAL | 0 refills | Status: DC
Start: 2021-09-04 — End: 2022-01-12

## 2021-09-04 MED ORDER — AZITHROMYCIN 250 MG PO TABS: ORAL_TABLET | ORAL | 0 refills | Status: AC

## 2021-09-04 MED ORDER — BENZONATATE 100 MG PO CAPS
100.0000 mg | ORAL_CAPSULE | Freq: Two times a day (BID) | ORAL | 0 refills | Status: DC | PRN
Start: 2021-09-04 — End: 2021-10-03

## 2021-09-04 NOTE — Progress Notes (Signed)
Telemedicine Encounter- SOAP NOTE Established Patient  This video encounter was conducted with the patient's (or proxy's) verbal consent via audio telecommunications: yes/no: Yes Patient was instructed to have this encounter in a suitably private space; and to only have persons present to whom they give permission to participate. In addition, patient identity was confirmed by use of name plus two identifiers (DOB and address).  I discussed the limitations, risks, security and privacy concerns of performing an evaluation and management service by telephone and the availability of in person appointments. I also discussed with the patient that there may be a patient responsible charge related to this service. The patient expressed understanding and agreed to proceed.  I spent a total of 15 minutes talking with the patient or their proxy.  Patient at home Provider in office  Participants: Kathrin Ruddy, NP and [name]  No chief complaint on file.   Subjective   Megan Gonzalez is a 50 y.o. established patient. Video visit today for cough and congestion  HPI Onset 10 days ago  Cough, congestion, ears popping, some mild tinnitus Mild tightness in chest.  Low grade temp  Taking advil cold, tylenol cold+flu, dayquil/nyquil Gives some relief but not much Sick contact: husband  Cough is mildly productive.  Some chills No chest pain, headaches, visual changes, other sensory changes.   Symptoms are steady to progressing - no improvement.   Patient Active Problem List   Diagnosis Date Noted   Dyslipidemia 10/04/2020   Seasonal allergies 03/10/2019   Vitamin D deficiency 03/10/2019   CKD (chronic kidney disease) stage 2, GFR 60-89 ml/min 09/08/2018   Edema 05/09/2018   Abdominal pain 08/25/2017   Chronic migraine 07/26/2017   Chronic low back pain without sciatica 07/26/2017   Gait abnormality 07/26/2017   Pain of left calf 10/25/2014   History of treatment by  mental health professional 10/02/2014   Atypical chest pain 08/09/2014   Routine general medical examination at a health care facility 08/09/2014   Constipation, postoperative 11/19/2013   Pelvic pain in female 11/17/2013   Back pain 02/03/2012    Past Medical History:  Diagnosis Date   Anemia    Anxiety    Arthritis    chronic low back pain   Cancer ([place])    cervical cancer; no more uterus   Depression    Fecal incontinence    Headache(784.0)    Numbness    Overactive bladder    Sleep apnea    CPAP-non compliant   Tremor     Current Outpatient Medications  Medication Sig Dispense Refill   azithromycin (ZITHROMAX) 250 MG tablet Take 2 tablets on day 1, then 1 tablet daily on days 2 through 5 6 tablet 0   benzonatate (TESSALON) 100 MG capsule Take 1 capsule (100 mg total) by mouth 2 (two) times daily as needed for cough. 20 capsule 0   dextromethorphan-guaiFENesin (MUCINEX DM) 30-600 MG 12hr tablet Take 1 tablet by mouth 2 (two) times daily. 20 tablet 0   predniSONE (STERAPRED UNI-PAK 21 TAB) 10 MG (21) TBPK tablet Take per package instructions. Do not skip doses. Finish entire supply. 1 each 0   albuterol (VENTOLIN HFA) 108 (90 Base) MCG/ACT inhaler Inhale 2 puffs into the lungs every 4 (four) hours as needed for shortness of breath. 6.7 g 2   azelastine (ASTELIN) 0.1 % nasal spray Place 2 sprays into both nostrils 2 (two) times daily. Use in each nostril as directed 30 mL 12  buPROPion (WELLBUTRIN XL) 300 MG 24 hr tablet take 300mg  by mouth daily  2   estradiol (VIVELLE-DOT) 0.025 MG/24HR Place 1 patch onto the skin 2 (two) times a week.     fluticasone (FLONASE) 50 MCG/ACT nasal spray Place 2 sprays into both nostrils daily. 48 g 2   fluticasone (FLOVENT HFA) 110 MCG/ACT inhaler Inhale 2 puffs into the lungs in the morning and at bedtime. Rinse mouth out after use. 1 each 1   gabapentin (NEURONTIN) 300 MG capsule Take 1 capsule (300 mg total) by mouth 3 (three) times daily.  270 capsule 2   HYDROcodone-acetaminophen (NORCO) 5-325 MG tablet Take 1 tablet by mouth every 6 (six) hours as needed for moderate pain. 15 tablet 0   ibuprofen (ADVIL) 800 MG tablet Take 1 tablet (800 mg total) by mouth every 8 (eight) hours as needed for moderate pain. 60 tablet 0   levocetirizine (XYZAL) 5 MG tablet Take 1 tablet (5 mg total) by mouth every evening. 30 tablet 2   prazosin (MINIPRESS) 2 MG capsule Take 2 mg by mouth at bedtime.     rizatriptan (MAXALT-MLT) 5 MG disintegrating tablet DISSOLVE ONE TABLET BY MOUTH AS NEEDED. MAY REPEAT IN 2 HOURS IF NEEDED. 10 tablet 11   No current facility-administered medications for this visit.    Allergies  Allergen Reactions   Codeine Nausea And Vomiting   Penicillins Other (See Comments)    Unknown- childhood reaction    Social History   Socioeconomic History   Marital status: Married    Spouse name: Not on file   Number of children: 3   Years of education: Masters   Highest education level: Not on file  Occupational History   Occupation: Tutoring part-time  Tobacco Use   Smoking status: Never   Smokeless tobacco: Never  Vaping Use   Vaping Use: Never used  Substance and Sexual Activity   Alcohol use: No   Drug use: No   Sexual activity: Not on file  Other Topics Concern   Not on file  Social History Narrative   Lives at home with husband and children.   Right-handed.   2 cups caffeine per day.   Social Determinants of Health   Financial Resource Strain: Not on file  Food Insecurity: Not on file  Transportation Needs: Not on file  Physical Activity: Not on file  Stress: Not on file  Social Connections: Not on file  Intimate Partner Violence: Not on file    ROS Per hpi   Objective   Vitals as reported by the patient: There were no vitals filed for this visit.  Diagnoses and all orders for this visit:  Bacterial upper respiratory infection -     azithromycin (ZITHROMAX) 250 MG tablet; Take 2  tablets on day 1, then 1 tablet daily on days 2 through 5 -     predniSONE (STERAPRED UNI-PAK 21 TAB) 10 MG (21) TBPK tablet; Take per package instructions. Do not skip doses. Finish entire supply. -     benzonatate (TESSALON) 100 MG capsule; Take 1 capsule (100 mg total) by mouth 2 (two) times daily as needed for cough. -     dextromethorphan-guaiFENesin (MUCINEX DM) 30-600 MG 12hr tablet; Take 1 tablet by mouth 2 (two) times daily.   PLAN Given duration of illness, concern for bacterial infection. Tx as above Discussed supportive care, ER and return precautions. Pt voices understanding Patient encouraged to call clinic with any questions, comments, or concerns.  I discussed  the assessment and treatment plan with the patient. The patient was provided an opportunity to ask questions and all were answered. The patient agreed with the plan and demonstrated an understanding of the instructions.   The patient was advised to call back or seek an in-person evaluation if the symptoms worsen or if the condition fails to improve as anticipated.  I provided 15 minutes of face-to-face time during this encounter.  Maximiano Coss, NP

## 2021-09-15 ENCOUNTER — Telehealth: Payer: Self-pay | Admitting: Family Medicine

## 2021-09-15 NOTE — Telephone Encounter (Signed)
Called left message to call back to schedule appointment with PCP

## 2021-09-15 NOTE — Telephone Encounter (Signed)
Nurse Assessment Nurse: Hardin Negus, RN, Mardene Celeste Date/Time Megan Gonzalez Time): 09/15/2021 9:02:25 AM Confirm and document reason for call. If symptomatic, describe symptoms. ---She is getting over a cold - not Covid - had virtual appt and got Rxs - prednisone, Abx, and something else. She has rash under arms, SOB, weakness, and fecal incontinence and rectum is raw/burning. She is having reflux that is worse than normal. Her chests feels its fully "expanded" She has a rash under breast for the last 3 months. Her DR has not seen it. She has fecal incontinence all the time. She feels "blah" Does the patient have any new or worsening symptoms? ---Yes Will a triage be completed? ---Yes Related visit to physician within the last 2 weeks? ---No Does the PT have any chronic conditions? (i.e. diabetes, asthma, this includes High risk factors for pregnancy, etc.) ---Yes List chronic conditions. ---sleep apnea, stool issues Is the patient pregnant or possibly pregnant? (Ask all females between the ages of 68-55) ---No Is this a behavioral health or substance abuse call? ---No PLEASE NOTE: All timestamps contained within this report are represented as [place] Standard Time. CONFIDENTIALTY NOTICE: This fax transmission is intended only for the addressee. It contains information that is legally privileged, confidential or otherwise protected from use or disclosure. If you are not the intended recipient, you are strictly prohibited from reviewing, disclosing, copying using or disseminating any of this information or taking any action in reliance on or regarding this information. If you have received this fax in error, please notify us immediately by telephone so that we can arrange for its return to [place]. Phone: [phone], Toll-Free: [phone], Fax: [phone] Page: 2 of 3 Call Id: [id number] Guidelines Guideline Title Affirmed Question Affirmed Notes Nurse Date/Time Megan Gonzalez Time) Breathing  Difficulty [1] MILD difficulty breathing (e.g., minimal/no SOB at rest, SOB with walking, pulse <100) AND [2] NEW-onset or WORSE than normal Oren Bracket 09/15/2021 9:07:07 AM Constipation Leaking stool Hardin Negus RN, Mardene Celeste 09/15/2021 9:12:23 AM Rash or Redness - Localized [1] Applying cream or ointment AND [2] causes severe itch, burning or pain Oren Bracket 09/15/2021 9:15:28 AM Disp. Time Megan Gonzalez Time) Disposition Final User 09/15/2021 9:00:36 AM Send to Urgent Chilton Gre[place]ouse, Erin 09/15/2021 9:11:50 AM See HCP within 4 Hours (or PCP triage) Hardin Negus, RNMardene Celeste 09/15/2021 9:14:58 AM See PCP within 24 Hours Hardin Negus, RNMardene Celeste 09/15/2021 9:20:53 AM Attempt made - no message left Oren Bracket 09/15/2021 9:20:15 AM Call PCP within 24 Hours Yes Hardin Negus, RN, [place] Disagree/Comply Comply Caller Understands Yes PreDisposition Call Doctor Care Advice Given Per Guideline SEE HCP (OR PCP TRIAGE) WITHIN 4 HOURS: CALL BACK IF: * You become worse CARE ADVICE given per Breathing Difficulty (Adult) guideline. SEE PCP WITHIN 24 HOURS: CALL BACK IF: * You become worse CARE ADVICE given per Constipation (Adult) guideline. CALL PCP WITHIN 24 HOURS: STOP THE CREAM: CALL BACK IF: * Rash becomes worse. CARE ADVICE given per Rash - Localized and Cause Unknown (Adult) guideline. PLEASE NOTE: All timestamps contained within this report are represented as [place] Standard Time. CONFIDENTIALTY NOTICE: This fax transmission is intended only for the addressee. It contains information that is legally privileged, confidential or otherwise protected from use or disclosure. If you are not the intended recipient, you are strictly prohibited from reviewing, disclosing, copying using or disseminating any of this information or taking any action in reliance on or regarding this information. If you have received this fax in error, please notify us immediately  by telephone so that  we can arrange for its return to [place]. Phone: [phone], Toll-Free: [phone], Fax: [phone] Page: 3 of 3 Call Id: [id number] Comments User: Ledora Bottcher, RN Date/Time Megan Gonzalez Time): 09/15/2021 9:39:55 AM Connected her to the office for an appointment Referrals REFERRED TO PCP OFFICE   Appt scheduled w/ PCP 09/16/21,

## 2021-09-15 NOTE — Telephone Encounter (Signed)
Called the patient and she has scheduled appt already.

## 2021-09-15 NOTE — Telephone Encounter (Signed)
Pt. Called in and stated she hasnt been feeling herself. She has a random rash under her arm, shes been having trouble breathing, she also has been having issues with her bowel movements and just overall thinks she needs to be seen. Transferred pt to triage to get further advice.

## 2021-09-16 ENCOUNTER — Other Ambulatory Visit: Payer: Self-pay

## 2021-09-16 ENCOUNTER — Encounter: Payer: Self-pay | Admitting: Family Medicine

## 2021-09-16 ENCOUNTER — Ambulatory Visit (INDEPENDENT_AMBULATORY_CARE_PROVIDER_SITE_OTHER): Payer: 59 | Admitting: Family Medicine

## 2021-09-16 VITALS — BP 122/80 | HR 88 | Temp 98.1°F | Ht 62.0 in | Wt 163.1 lb

## 2021-09-16 DIAGNOSIS — N644 Mastodynia: Secondary | ICD-10-CM | POA: Diagnosis not present

## 2021-09-16 DIAGNOSIS — K219 Gastro-esophageal reflux disease without esophagitis: Secondary | ICD-10-CM

## 2021-09-16 DIAGNOSIS — L299 Pruritus, unspecified: Secondary | ICD-10-CM | POA: Diagnosis not present

## 2021-09-16 DIAGNOSIS — K6289 Other specified diseases of anus and rectum: Secondary | ICD-10-CM | POA: Diagnosis not present

## 2021-09-16 MED ORDER — PANTOPRAZOLE SODIUM 40 MG PO TBEC: 40.0000 mg | DELAYED_RELEASE_TABLET | Freq: Every day | ORAL | 1 refills | Status: DC

## 2021-09-16 MED ORDER — TRIAMCINOLONE ACETONIDE 0.1 % EX CREA: TOPICAL_CREAM | CUTANEOUS | 0 refills | Status: DC

## 2021-09-16 MED ORDER — CLOTRIMAZOLE-BETAMETHASONE 1-0.05 % EX CREA: 1.0000 "application " | TOPICAL_CREAM | Freq: Two times a day (BID) | CUTANEOUS | 0 refills | Status: DC

## 2021-09-16 MED ORDER — FLUCONAZOLE 150 MG PO TABS: ORAL_TABLET | ORAL | 0 refills | Status: DC

## 2021-09-16 NOTE — Patient Instructions (Addendum)
Try not to scratch as this can make things worse. Avoid scented products while dealing with this. You may resume when the itchiness resolves. Cold/cool compresses can help.   Use an emollient like Aquaphor or Vaseline as often as you need for soothing and forming a barrier over the raw skin.  The only lifestyle changes that have data behind them are weight loss for the overweight/obese and elevating the head of the bed. Finding out which foods/positions are triggers is important.  Someone will reach out regarding your imaging in the next 1-2 weeks.   Let us know if you need anything.  Pectoralis Major Rehab Ask your health care provider which exercises are safe for you. Do exercises exactly as told by your health care provider and adjust them as directed. It is normal to feel mild stretching, pulling, tightness, or discomfort as you do these exercises, but you should stop right away if you feel sudden pain or your pain gets worse. Do not begin these exercises until told by your health care provider. Stretching and range of motion exercises These exercises warm up your muscles and joints and improve the movement and flexibility of your shoulder. These exercises can also help to relieve pain, numbness, and tingling. Exercise A: Pendulum  Stand near a wall or a surface that you can hold onto for balance. Bend at the waist and let your left / right arm hang straight down. Use your other arm to keep your balance. Relax your arm and shoulder muscles, and move your hips and your trunk so your left / right arm swings freely. Your arm should swing because of the motion of your body, not because you are using your arm or shoulder muscles. Keep moving so your arm swings in the following directions, as told by your health care provider: Side to side. Forward and backward. In clockwise and counterclockwise circles. Slowly return to the starting position. Repeat 2 times. Complete this exercise 3 times per  week. Exercise B: Abduction, standing Stand and hold a broomstick, a cane, or a similar object. Place your hands a little more than shoulder-width apart on the object. Your left / right hand should be palm-up, and your other hand should be palm-down. While keeping your elbow straight and your shoulder muscles relaxed, push the stick across your body toward your left / right side. Raise your left / right arm to the side of your body and then over your head until you feel a stretch in your shoulder. Stop when you reach the angle that is recommended by your health care provider. Avoid shrugging your shoulder while you raise your arm. Keep your shoulder blade tucked down toward the middle of your spine. Hold for 10 seconds. Slowly return to the starting position. Repeat 2 times. Complete this exercise 3 times per week. Exercise C: Wand flexion, supine  Lie on your back. You may bend your knees for comfort. Hold a broomstick, a cane, or a similar object so that your hands are about shoulder-width apart on the object. Your palms should face toward your feet. Raise your left / right arm in front of your face, then behind your head (toward the floor). Use your other hand to help you do this. Stop when you feel a gentle stretch in your shoulder, or when you reach the angle that is recommended by your health care provider. Hold for 3 seconds. Use the broomstick and your other arm to help you return your left / right arm to  the starting position. Repeat 2 times. Complete this exercise 3 times per week. Exercise D: Wand shoulder external rotation Stand and hold a broomstick, a cane, or a similar object so your hands are about shoulder-width apart on the object. Start with your arms hanging down, then bend both elbows to an "L" shape (90 degrees). Keep your left / right elbow at your side. Use your other hand to push the stick so your left / right forearm moves away from your body, out to your side. Keep  your left / right elbow bent to 90 degrees and keep it against your side. Stop when you feel a gentle stretch in your shoulder, or when you reach the angle recommended by your health care provider. Hold for 10 seconds. Use the stick to help you return your left / right arm to the starting position. Repeat 2 times. Complete this exercise 3 times per week. Strengthening exercises These exercises build strength and endurance in your shoulder. Endurance is the ability to use your muscles for a long time, even after your muscles get tired. Exercise E: Scapular protraction, standing Stand so you are facing a wall. Place your feet about one arm-length away from the wall. Place your hands on the wall and straighten your elbows. Keep your hands on the wall as you push your upper back away from the wall. You should feel your shoulder blades sliding forward. Keep your elbows and your head still. If you are not sure that you are doing this exercise correctly, ask your health care provider for more instructions. Hold for 3 seconds. Slowly return to the starting position. Let your muscles relax completely before you repeat this exercise. Repeat 2 times. Complete this exercise 3 times per week. Exercise F: Shoulder blade squeezes  (scapular retraction) Sit with good posture in a stable chair. Do not let your back touch the back of the chair. Your arms should be at your sides with your elbows bent. You may rest your forearms on a pillow if that is more comfortable. Squeeze your shoulder blades together. Bring them down and back. Keep your shoulders level. Do not lift your shoulders up toward your ears. Hold for 3 seconds. Return to the starting position. Repeat 2 times. Complete this exercise 3 times per week. This information is not intended to replace advice given to you by your health care provider. Make sure you discuss any questions you have with your health care provider. Document Released:  10/12/2005 Document Revised: 07/23/2016 Document Reviewed: 06/30/2015 Elsevier Interactive Patient Education  [employer].

## 2021-09-16 NOTE — Progress Notes (Signed)
Chief Complaint  Patient presents with   Rectal Pain    Rectum burning   Rash    Under arm Lump- left breast and underarm    Megan Gonzalez is a 50 y.o. female here for skin complaints.  Duration: 5 days Location: around bottom Pruritic? No Painful? Yes Drainage? No New soaps/lotions/topicals/detergents? No Sick contacts? No Other associated symptoms: anal leakage which is causing the irritation Therapies tried thus far: A&D ointment  Duration: 3 months Location: L breast/armpit Pruritic? Yes Painful? No Drainage? No New soaps/lotions/topicals/detergents? No Sick contacts? No Other associated symptoms: no nipple drainage Mom dx'd w breast cancer when she was 16.  Therapies tried thus far: none  Past Medical History:  Diagnosis Date   Anemia    Anxiety    Arthritis    chronic low back pain   Cancer ([facility])    cervical cancer; no more uterus   Depression    Fecal incontinence    Headache(784.0)    Numbness    Overactive bladder    Sleep apnea    CPAP-non compliant   Tremor     BP 122/80   Pulse 88   Temp 98.1 F (36.7 C) (Oral)   Ht 5\' 2"  (1.575 m)   Wt 163 lb 2 oz (74 kg)   LMP 04/24/2012   SpO2 98%   BMI 29.84 kg/m  Gen: awake, alert, appearing stated age Lungs: No accessory muscle use Skin: Scaly patches and macules over the left axillary region without nodularity lymph nodes appreciated. No drainage, erythema, fluctuance, excoriation Breast: Examined in the presence of a female chaperone.  No nodularity or densities palpated in the left breast.  No nipple discharge.  There is no fluctuance, erythema, tenderness to palpation, or dimpling. Psych: Age appropriate judgment and insight  Anal irritation - Plan: clotrimazole-betamethasone (LOTRISONE) cream  Pruritic dermatitis - Plan: triamcinolone cream (KENALOG) 0.1 %  Breast pain - Plan: MM Digital Diagnostic Unilat L, US BREAST COMPLETE UNI LEFT INC AXILLA  Gastroesophageal reflux disease,  unspecified whether esophagitis present - Plan: pantoprazole (PROTONIX) 40 MG tablet  Lotrisone to cover for fungal etiologies. Vaseline. Kenalog for axillary region, likely eczema. New problem, uncertain prog. Ck above.  F/u prn. The patient voiced understanding and agreement to the plan.  [place], DO 09/16/21 3:51 PM

## 2021-10-03 ENCOUNTER — Encounter: Payer: Self-pay | Admitting: Family Medicine

## 2021-10-03 ENCOUNTER — Ambulatory Visit (INDEPENDENT_AMBULATORY_CARE_PROVIDER_SITE_OTHER): Payer: 59 | Admitting: Family Medicine

## 2021-10-03 VITALS — BP 120/78 | HR 84 | Temp 98.1°F | Ht 62.0 in | Wt 164.2 lb

## 2021-10-03 DIAGNOSIS — Z0000 Encounter for general adult medical examination without abnormal findings: Secondary | ICD-10-CM

## 2021-10-03 LAB — COMPREHENSIVE METABOLIC PANEL
ALT: 20 U/L (ref 0–35)
AST: 18 U/L (ref 0–37)
Albumin: 4.3 g/dL (ref 3.5–5.2)
Alkaline Phosphatase: 74 U/L (ref 39–117)
BUN: 11 mg/dL (ref 6–23)
CO2: 30 mEq/L (ref 19–32)
Calcium: 9.6 mg/dL (ref 8.4–10.5)
Chloride: 105 mEq/L (ref 96–112)
Creatinine, Ser: 1.2 mg/dL (ref 0.40–1.20)
GFR: 53.09 mL/min — ABNORMAL LOW (ref >60.00)
Glucose, Bld: 81 mg/dL (ref 70–99)
Potassium: 4.7 mEq/L (ref 3.5–5.1)
Sodium: 141 mEq/L (ref 135–145)
Total Bilirubin: 0.5 mg/dL (ref 0.2–1.2)
Total Protein: 6.5 g/dL (ref 6.0–8.3)

## 2021-10-03 LAB — LIPID PANEL
Cholesterol: 199 mg/dL (ref 0–200)
HDL: 44.7 mg/dL (ref >39.00)
LDL Cholesterol: 132 mg/dL — ABNORMAL HIGH (ref 0–99)
NonHDL: 154.05
Total CHOL/HDL Ratio: 4
Triglycerides: 112 mg/dL (ref 0.0–149.0)
VLDL: 22.4 mg/dL (ref 0.0–40.0)

## 2021-10-03 LAB — CBC
HCT: 39.3 % (ref 36.0–46.0)
Hemoglobin: 12.9 g/dL (ref 12.0–15.0)
MCHC: 32.8 g/dL (ref 30.0–36.0)
MCV: 83 fl (ref 78.0–100.0)
Platelets: 245 10*3/uL (ref 150.0–400.0)
RBC: 4.73 Mil/uL (ref 3.87–5.11)
RDW: 14.8 % (ref 11.5–15.5)
WBC: 4.4 10*3/uL (ref 4.0–10.5)

## 2021-10-03 NOTE — Progress Notes (Signed)
Chief Complaint  Patient presents with   Follow-up     Well Woman Megan Gonzalez is here for a complete physical.   Her last physical was >1 year ago.  Current diet: in general, a "healthy" diet. Current exercise: physical therapy. Weight is stable and she denies fatigue out of ordinary. Seatbelt? Yes Advanced directive? No  Health Maintenance Pap/HPV- Yes Mammogram- Yes Tetanus- Yes Hep C screening- Yes HIV screening- Yes  Past Medical History:  Diagnosis Date   Anemia    Anxiety    Arthritis    chronic low back pain   Cancer (HCC)    cervical cancer; no more uterus   Depression    Fecal incontinence    Headache(784.0)    Numbness    Overactive bladder    Sleep apnea    CPAP-non compliant   Tremor      Past Surgical History:  Procedure Laterality Date   ABDOMINAL HYSTERECTOMY     APPENDECTOMY  08/25/2017   bladder neurostimulator     COLONOSCOPY  1999   DILATION AND CURETTAGE OF UTERUS     FOOT SURGERY  2006   left   INCISION AND DRAINAGE ABSCESS N/A 11/19/2013   Procedure: POSTERIOR CULPOTOMY (INCISION AND DRAINAGE OF POST OPERATIVE CUFF HEMATOMA);  Surgeon: Jonnie Kind, MD;  Location: [facility];  Service: Gynecology;  Laterality: N/A;   LAPAROSCOPIC APPENDECTOMY N/A 08/26/2017   Procedure: APPENDECTOMY LAPAROSCOPIC;  Surgeon: Jackolyn Confer, MD;  Location: [facility];  Service: General;  Laterality: N/A;   PUBOVAGINAL SLING  03/24/2012   Procedure: Gaynelle Arabian;  Surgeon: Ailene Rud, MD;  Location: [facility];  Service: Urology;  Laterality: N/A;  lynx with [employer]    REMOVAL OF URINARY SLING     SVD     x 3   TONSILLECTOMY  as child   URETERAL EXPLORATION  5./30/13   VAGINAL HYSTERECTOMY N/A 1/2/015   [employer], Dr Linus Mako and Dr Leory Plowman, with release of Pubovag sling    Medications  Current Outpatient Medications on File Prior to Visit  Medication Sig Dispense Refill   albuterol (VENTOLIN HFA) 108 (90 Base) MCG/ACT  inhaler Inhale 2 puffs into the lungs every 4 (four) hours as needed for shortness of breath. 6.7 g 2   azelastine (ASTELIN) 0.1 % nasal spray Place 2 sprays into both nostrils 2 (two) times daily. Use in each nostril as directed 30 mL 12   benzonatate (TESSALON) 100 MG capsule Take 1 capsule (100 mg total) by mouth 2 (two) times daily as needed for cough. 20 capsule 0   buPROPion (WELLBUTRIN XL) 300 MG 24 hr tablet take 300mg  by mouth daily  2   clotrimazole-betamethasone (LOTRISONE) cream Apply 1 application topically 2 (two) times daily. 30 g 0   dextromethorphan-guaiFENesin (MUCINEX DM) 30-600 MG 12hr tablet Take 1 tablet by mouth 2 (two) times daily. 20 tablet 0   estradiol (VIVELLE-DOT) 0.025 MG/24HR Place 1 patch onto the skin 2 (two) times a week.     fluconazole (DIFLUCAN) 150 MG tablet Take 1 tab, repeat in 72 hours if no improvement. 2 tablet 0   fluticasone (FLONASE) 50 MCG/ACT nasal spray Place 2 sprays into both nostrils daily. 48 g 2   fluticasone (FLOVENT HFA) 110 MCG/ACT inhaler Inhale 2 puffs into the lungs in the morning and at bedtime. Rinse mouth out after use. 1 each 1   gabapentin (NEURONTIN) 300 MG capsule Take 1 capsule (300 mg total) by mouth 3 (three)  times daily. 270 capsule 2   HYDROcodone-acetaminophen (NORCO) 5-325 MG tablet Take 1 tablet by mouth every 6 (six) hours as needed for moderate pain. 15 tablet 0   ibuprofen (ADVIL) 800 MG tablet Take 1 tablet (800 mg total) by mouth every 8 (eight) hours as needed for moderate pain. 60 tablet 0   levocetirizine (XYZAL) 5 MG tablet Take 1 tablet (5 mg total) by mouth every evening. 30 tablet 2   pantoprazole (PROTONIX) 40 MG tablet Take 1 tablet (40 mg total) by mouth daily. 30 tablet 1   prazosin (MINIPRESS) 2 MG capsule Take 2 mg by mouth at bedtime.     predniSONE (STERAPRED UNI-PAK 21 TAB) 10 MG (21) TBPK tablet Take per package instructions. Do not skip doses. Finish entire supply. 1 each 0   rizatriptan (MAXALT-MLT) 5  MG disintegrating tablet DISSOLVE ONE TABLET BY MOUTH AS NEEDED. MAY REPEAT IN 2 HOURS IF NEEDED. 10 tablet 11   triamcinolone cream (KENALOG) 0.1 % Apply to armpit region twice daily for 7-10 days. 30 g 0    Allergies Allergies  Allergen Reactions   Codeine Nausea And Vomiting   Penicillins Other (See Comments)    Unknown- childhood reaction    Review of Systems: Constitutional:  no unexpected weight changes Eye:  no recent significant change in vision Ear/Nose/Mouth/Throat:  Ears:  no recent change in hearing Nose/Mouth/Throat:  no complaints of nasal congestion, no sore throat Cardiovascular: no chest pain Respiratory:  no shortness of breath Gastrointestinal:  no abdominal pain GU:  Female: negative for dysuria or pelvic pain Musculoskeletal/Extremities:  no new pain of the joints Integumentary (Skin/Breast):  no abnormal skin lesions reported Neurologic:  no headaches Endocrine:  denies fatigue Hematologic/Lymphatic:  No areas of easy bleeding  Exam BP 120/78   Pulse 84   Temp 98.1 F (36.7 C) (Oral)   Ht 5\' 2"  (1.575 m)   Wt 164 lb 4 oz (74.5 kg)   LMP 04/24/2012   SpO2 99%   BMI 30.04 kg/m  General:  well developed, well nourished, in no apparent distress Skin:  no significant moles, warts, or growths Head:  no masses, lesions, or tenderness Eyes:  pupils equal and round, sclera anicteric without injection Ears:  canals without lesions, TMs shiny without retraction, no obvious effusion, no erythema Nose:  nares patent, septum midline, mucosa normal, and no drainage or sinus tenderness Throat/Pharynx:  lips and gingiva without lesion; tongue and uvula midline; non-inflamed pharynx; no exudates or postnasal drainage Neck: neck supple without adenopathy, thyromegaly, or masses Lungs:  clear to auscultation, breath sounds equal bilaterally, no respiratory distress Cardio:  regular rate and rhythm, no LE edema Abdomen:  abdomen soft, diffusely ttp (chronic); bowel  sounds normal; no masses or organomegaly Genital: Defer to GYN Musculoskeletal:  symmetrical muscle groups noted without atrophy or deformity Extremities:  no clubbing, cyanosis, or edema, no deformities, no skin discoloration Neuro:  gait antalgic (chronic), deep tendon reflexes normal and symmetric Psych: well oriented with normal range of affect and appropriate judgment/insight  Assessment and Plan  Well adult exam - Plan: CBC, Comprehensive metabolic panel, Lipid panel   Well 50 y.o. female. Counseled on diet and exercise. Other orders as above. Will come back for flu shot. Bivalent covid booster rec'd.  Follow up in 6 mo or prn. The patient voiced understanding and agreement to the plan.  [place], DO 10/03/21 9:33 AM

## 2021-10-03 NOTE — Patient Instructions (Addendum)
Give [place] 2-3 business days to get the results of your labs back.   Keep the diet clean and stay active.  I recommend getting the updated bivalent covid vaccination booster at your convenience.   Let us know if you need anything.

## 2021-10-10 ENCOUNTER — Encounter: Payer: Self-pay | Admitting: Family Medicine

## 2021-10-10 ENCOUNTER — Ambulatory Visit (INDEPENDENT_AMBULATORY_CARE_PROVIDER_SITE_OTHER): Payer: 59 | Admitting: *Deleted

## 2021-10-10 DIAGNOSIS — Z23 Encounter for immunization: Secondary | ICD-10-CM | POA: Diagnosis not present

## 2021-10-10 NOTE — Progress Notes (Addendum)
Patient here for flu vaccine.  Vaccine given in right deltoid and patient tolerated well.

## 2021-10-31 ENCOUNTER — Telehealth (INDEPENDENT_AMBULATORY_CARE_PROVIDER_SITE_OTHER): Payer: 59 | Admitting: Family

## 2021-10-31 ENCOUNTER — Encounter: Payer: Self-pay | Admitting: Family

## 2021-10-31 VITALS — Ht 62.0 in | Wt 162.0 lb

## 2021-10-31 DIAGNOSIS — R194 Change in bowel habit: Secondary | ICD-10-CM

## 2021-10-31 DIAGNOSIS — R159 Full incontinence of feces: Secondary | ICD-10-CM

## 2021-10-31 DIAGNOSIS — R109 Unspecified abdominal pain: Secondary | ICD-10-CM

## 2021-10-31 NOTE — Progress Notes (Signed)
Megan Gonzalez is a 51 y.o. female with the following history as recorded in EpicCare:  Patient Active Problem List   Diagnosis Date Noted   Dyslipidemia 10/04/2020   Seasonal allergies 03/10/2019   Vitamin D deficiency 03/10/2019   CKD (chronic kidney disease) stage 2, GFR 60-89 ml/min 09/08/2018   Edema 05/09/2018   Abdominal pain 08/25/2017   Chronic migraine 07/26/2017   Chronic low back pain without sciatica 07/26/2017   Gait abnormality 07/26/2017   Pain of left calf 10/25/2014   History of treatment by mental health professional 10/02/2014   Atypical chest pain 08/09/2014   Routine general medical examination at a health care facility 08/09/2014   Constipation, postoperative 11/19/2013   Pelvic pain in female 11/17/2013   Back pain 02/03/2012    Current Outpatient Medications  Medication Sig Dispense Refill   albuterol (VENTOLIN HFA) 108 (90 Base) MCG/ACT inhaler Inhale 2 puffs into the lungs every 4 (four) hours as needed for shortness of breath. 6.7 g 2   azelastine (ASTELIN) 0.1 % nasal spray Place 2 sprays into both nostrils 2 (two) times daily. Use in each nostril as directed 30 mL 12   buPROPion (WELLBUTRIN XL) 300 MG 24 hr tablet take 300mg  by mouth daily  2   clotrimazole-betamethasone (LOTRISONE) cream Apply 1 application topically 2 (two) times daily. 30 g 0   dextromethorphan-guaiFENesin (MUCINEX DM) 30-600 MG 12hr tablet Take 1 tablet by mouth 2 (two) times daily. 20 tablet 0   estradiol (VIVELLE-DOT) 0.025 MG/24HR Place 1 patch onto the skin 2 (two) times a week.     fluconazole (DIFLUCAN) 150 MG tablet Take 1 tab, repeat in 72 hours if no improvement. 2 tablet 0   fluticasone (FLONASE) 50 MCG/ACT nasal spray Place 2 sprays into both nostrils daily. 48 g 2   fluticasone (FLOVENT HFA) 110 MCG/ACT inhaler Inhale 2 puffs into the lungs in the morning and at bedtime. Rinse mouth out after use. 1 each 1   gabapentin (NEURONTIN) 300 MG capsule Take 1 capsule (300  mg total) by mouth 3 (three) times daily. 270 capsule 2   levocetirizine (XYZAL) 5 MG tablet Take 1 tablet (5 mg total) by mouth every evening. (Patient not taking: Reported on 10/31/2021) 30 tablet 2   pantoprazole (PROTONIX) 40 MG tablet Take 1 tablet (40 mg total) by mouth daily. (Patient not taking: Reported on 10/31/2021) 30 tablet 1   prazosin (MINIPRESS) 2 MG capsule Take 2 mg by mouth at bedtime. (Patient not taking: Reported on 10/31/2021)     rizatriptan (MAXALT-MLT) 5 MG disintegrating tablet DISSOLVE ONE TABLET BY MOUTH AS NEEDED. MAY REPEAT IN 2 HOURS IF NEEDED. (Patient not taking: Reported on 10/31/2021) 10 tablet 11   No current facility-administered medications for this visit.    Allergies: Codeine and Penicillins  Past Medical History:  Diagnosis Date   Anemia    Anxiety    Arthritis    chronic low back pain   Cancer ([facility])    cervical cancer; no more uterus   Depression    Fecal incontinence    Headache(784.0)    Numbness    Overactive bladder    Sleep apnea    CPAP-non compliant   Tremor     Past Surgical History:  Procedure Laterality Date   ABDOMINAL HYSTERECTOMY     APPENDECTOMY  08/25/2017   bladder neurostimulator     COLONOSCOPY  1999   DILATION AND CURETTAGE OF UTERUS     FOOT SURGERY  2006  left   INCISION AND DRAINAGE ABSCESS N/A 11/19/2013   Procedure: POSTERIOR CULPOTOMY (INCISION AND DRAINAGE OF POST OPERATIVE CUFF HEMATOMA);  Surgeon: Jonnie Kind, MD;  Location: [facility];  Service: Gynecology;  Laterality: N/A;   LAPAROSCOPIC APPENDECTOMY N/A 08/26/2017   Procedure: APPENDECTOMY LAPAROSCOPIC;  Surgeon: Jackolyn Confer, MD;  Location: [facility];  Service: General;  Laterality: N/A;   PUBOVAGINAL SLING  03/24/2012   Procedure: Gaynelle Arabian;  Surgeon: Ailene Rud, MD;  Location: [facility];  Service: Urology;  Laterality: N/A;  lynx with [employer]    REMOVAL OF URINARY SLING     SVD     x 3   TONSILLECTOMY  as child   URETERAL  EXPLORATION  5./30/13   VAGINAL HYSTERECTOMY N/A 1/2/015   [employer], Dr Linus Mako and Dr Leory Plowman, with release of Pubovag sling    Family History  Problem Relation Age of Onset   Cancer Mother 52       Breast cancer   Thyroid disease Mother    Hypotension Mother    Other Father        unsure of history   Anesthesia problems Neg Hx     Social History   Tobacco Use   Smoking status: Never   Smokeless tobacco: Never  Substance Use Topics   Alcohol use: No    Subjective:    I connected with Megan Gonzalez on 10/31/21 at  3:20 PM EST by a video enabled telemedicine application and verified that I am speaking with the correct person using two identifiers.   I discussed the limitations of evaluation and management by telemedicine and the availability of in person appointments. The patient expressed understanding and agreed to proceed. Provider in office/ patient is at home; provider and patient are only 2 people on video call.   Notes that last week she was having changes in appearance of bowels accompanied by incontinence; states stool last week looked "like orange jello." States that this week she has since not been able to have any bowel movement at all accompanied by "severe pain."    Objective:  Vitals:   10/31/21 1457  Weight: 162 lb (73.5 kg)  Height: 5\' 2"  (1.575 m)    General: Well developed, well nourished, in no acute distress  Head: Normocephalic and atraumatic  Lungs: Respirations unlabored;  Neurologic: Alert and oriented; speech intact; face symmetrical;   Assessment:  1. Abdominal pain, unspecified abdominal location   2. Incontinence of feces, unspecified fecal incontinence type   3. Bowel habit changes     Plan:  Patient opted to do a virtual visit today and appointment is at 3:40 on a Friday afternoon; discussed concern about her symptoms and need for imaging; recommend that she go to ER and patient in agreement.   This visit occurred during  the SARS-CoV-2 public health emergency.  Safety protocols were in place, including screening questions prior to the visit, additional usage of staff PPE, and extensive cleaning of exam room while observing appropriate contact time as indicated for disinfecting solutions.    No follow-ups on file.  No orders of the defined types were placed in this encounter.   Requested Prescriptions    No prescriptions requested or ordered in this encounter

## 2021-11-13 ENCOUNTER — Other Ambulatory Visit: Payer: Self-pay | Admitting: Family Medicine

## 2021-11-13 DIAGNOSIS — K219 Gastro-esophageal reflux disease without esophagitis: Secondary | ICD-10-CM

## 2022-01-12 ENCOUNTER — Other Ambulatory Visit (INDEPENDENT_AMBULATORY_CARE_PROVIDER_SITE_OTHER): Payer: 59

## 2022-01-12 ENCOUNTER — Other Ambulatory Visit: Payer: Self-pay | Admitting: Family Medicine

## 2022-01-12 ENCOUNTER — Encounter: Payer: Self-pay | Admitting: Family Medicine

## 2022-01-12 ENCOUNTER — Telehealth (INDEPENDENT_AMBULATORY_CARE_PROVIDER_SITE_OTHER): Payer: 59 | Admitting: Family Medicine

## 2022-01-12 DIAGNOSIS — K219 Gastro-esophageal reflux disease without esophagitis: Secondary | ICD-10-CM | POA: Diagnosis not present

## 2022-01-12 DIAGNOSIS — R350 Frequency of micturition: Secondary | ICD-10-CM | POA: Diagnosis not present

## 2022-01-12 MED ORDER — PANTOPRAZOLE SODIUM 40 MG PO TBEC: DELAYED_RELEASE_TABLET | ORAL | 2 refills | Status: DC

## 2022-01-12 MED ORDER — LIDOCAINE VISCOUS HCL 2 % MT SOLN
15.0000 mL | Freq: Once | OROMUCOSAL | Status: AC
  Administered 2022-01-12: 15 mL via OROMUCOSAL

## 2022-01-12 MED ORDER — ALUM & MAG HYDROXIDE-SIMETH 200-200-20 MG/5ML PO SUSP
30.0000 mL | Freq: Once | ORAL | Status: AC
  Administered 2022-01-12: 30 mL via ORAL

## 2022-01-12 MED ORDER — HYOSCYAMINE SULFATE 0.125 MG SL SUBL
0.1250 mg | SUBLINGUAL_TABLET | Freq: Once | SUBLINGUAL | Status: AC
  Administered 2022-01-12: 0.125 mg via ORAL

## 2022-01-12 NOTE — Progress Notes (Signed)
Chief Complaint  ?Patient presents with  ? Abdominal Pain  ? ? ?Subjective: ?Patient is a 51 y.o. female here for upper abd pain and back pain. Due to COVID-19 pandemic, we are interacting via web portal for an electronic face-to-face visit. I verified patient's ID using 2 identifiers. Patient agreed to proceed with visit via this method. Patient is in a parked car, I am at office. Patient and I are present for visit.  ? ?Being tx'd for UTI with Macrobid. Has pressure in mid section of back. Also has metallic taste in month. Feels like when she had reflux s/s's in past and did well with a GI cocktail. Urinary ss's are improved. No fevers, N/V, bleeding. Ran out of Protonix.  ? ?Past Medical History:  ?Diagnosis Date  ? Anemia   ? Anxiety   ? Arthritis   ? chronic low back pain  ? Cancer [facility])   ? cervical cancer; no more uterus  ? Depression   ? Fecal incontinence   ? Headache(784.0)   ? Numbness   ? Overactive bladder   ? Sleep apnea   ? CPAP-non compliant  ? Tremor   ? ? ?Objective: ?No conversational dyspnea ?Age appropriate judgment and insight ?Nml affect and mood ? ?Assessment and Plan: ?Gastroesophageal reflux disease, unspecified whether esophagitis present - Plan: pantoprazole (PROTONIX) 40 MG tablet ? ?Exacerbation of chronic issue. Restart Protonix 40 mg/d, Pepcid 20 mg prn. Coming in for GI cocktail today.  ?Ck urine to f/u on UTI at her request and also check urine microalbumin:cr.  ?The patient voiced understanding and agreement to the plan. ? ?Shelda Pal, DO ?01/12/22  ?3:41 PM ? ? ? ? ?

## 2022-01-12 NOTE — Addendum Note (Signed)
Addended by: Sharon Seller B on: 01/12/2022 04:18 PM ? ? Modules accepted: Orders ? ?

## 2022-01-12 NOTE — Addendum Note (Signed)
Addended by: Kelle Darting A on: 01/12/2022 03:57 PM ? ? Modules accepted: Orders ? ?

## 2022-01-12 NOTE — Addendum Note (Signed)
Addended by: Sharon Seller B on: 01/12/2022 03:45 PM ? ? Modules accepted: Orders ? ?

## 2022-01-13 ENCOUNTER — Other Ambulatory Visit: Payer: Self-pay | Admitting: Family Medicine

## 2022-01-13 LAB — MICROALBUMIN / CREATININE URINE RATIO
Creatinine,U: 104.3 mg/dL
Microalb Creat Ratio: 0.7 mg/g (ref 0.0–30.0)
Microalb, Ur: <0.7 mg/dL (ref 0.0–1.9)

## 2022-01-13 LAB — URINALYSIS, MICROSCOPIC ONLY: RBC / HPF: NONE SEEN (ref 0–?)

## 2022-01-13 MED ORDER — NITROFURANTOIN MONOHYD MACRO 100 MG PO CAPS: 100.0000 mg | ORAL_CAPSULE | Freq: Two times a day (BID) | ORAL | 0 refills | Status: AC

## 2022-01-20 ENCOUNTER — Ambulatory Visit (INDEPENDENT_AMBULATORY_CARE_PROVIDER_SITE_OTHER): Payer: 59 | Admitting: Family

## 2022-01-20 ENCOUNTER — Other Ambulatory Visit: Payer: Self-pay

## 2022-01-20 ENCOUNTER — Ambulatory Visit (HOSPITAL_BASED_OUTPATIENT_CLINIC_OR_DEPARTMENT_OTHER)
Admission: RE | Admit: 2022-01-20 | Discharge: 2022-01-20 | Disposition: A | Discharge status: Home / Self Care | Payer: 59 | Source: Ambulatory Visit | Attending: Family | Admitting: Family

## 2022-01-20 VITALS — BP 135/71 | HR 85 | Temp 98.4°F | Resp 16 | Wt 170.0 lb

## 2022-01-20 DIAGNOSIS — M545 Low back pain, unspecified: Secondary | ICD-10-CM | POA: Diagnosis not present

## 2022-01-20 DIAGNOSIS — R109 Unspecified abdominal pain: Secondary | ICD-10-CM | POA: Insufficient documentation

## 2022-01-20 LAB — POC URINALSYSI DIPSTICK (AUTOMATED)
Bilirubin, UA: NEGATIVE
Blood, UA: NEGATIVE
Glucose, UA: NEGATIVE
Ketones, UA: NEGATIVE
Leukocytes, UA: NEGATIVE
Nitrite, UA: NEGATIVE
Protein, UA: NEGATIVE
Spec Grav, UA: 1.01 (ref 1.010–1.025)
Urobilinogen, UA: 0.2 E.U./dL
pH, UA: 7.5 (ref 5.0–8.0)

## 2022-01-20 MED ORDER — METHYLPREDNISOLONE 4 MG PO TBPK: ORAL_TABLET | ORAL | 0 refills | Status: DC

## 2022-01-20 NOTE — Assessment & Plan Note (Signed)
Suspect musculoskeletal back pain.  Will give trial of medrol dose pak.  ?

## 2022-01-20 NOTE — Assessment & Plan Note (Signed)
Suspect that constipation is a contributing factor.  Will obtain KUB to evaluate for constipation and also evaluate for kidney stone.  UA is unremarkable, but will send for culture to exclude UTI.  Clinically symptoms do not seem consistent with pyelonephritis.  ?

## 2022-01-20 NOTE — Progress Notes (Signed)
? ?Subjective:  ? ?By signing my name below, I, Carylon Perches, attest that this documentation has been prepared under the direction and in the presence of Debbrah Alar NP, 01/20/2022   ? ? Patient ID: Megan Gonzalez, female    DOB: 1970/12/17, 51 y.o.   MRN: [id number] ? ?Chief Complaint  ?Patient presents with  ? Back Pain  ?  Complains of low to mid back pain   ? ? ?HPI ?Patient is in today for an office visit.  ? ?UTI Symptoms/Back Pain - Patient complains of UTI symptoms that appeared 2 weeks ago. She originally had a cold and proceeded to take antibiotics, her cold symptoms were resolved but she continues to have UTI symptoms. She stated that she had a constant, dull, aching pain in her mid back which caused her to have trouble walking. She also had dysuria during the same week. She was prescribed 100 MG Macrobid which resolved her dysuria but she continues to have pressure in her lower back. She had a virtual office visit with Dr. Nani Ravens on 01/12/2022 and was given a GI cocktail for her reflux. Bacteria was found in her urine. On 01/19/2022, she noted that her urine had a foul - smell. She states that her urinary incontinence worsens as a result of her constipation.  ? ?There are no preventive care reminders to display for this patient. ? ?Past Medical History:  ?Diagnosis Date  ? Anemia   ? Anxiety   ? Arthritis   ? chronic low back pain  ? Cancer [facility])   ? cervical cancer; no more uterus  ? Depression   ? Fecal incontinence   ? Headache(784.0)   ? Numbness   ? Overactive bladder   ? Sleep apnea   ? CPAP-non compliant  ? Tremor   ? ? ?Past Surgical History:  ?Procedure Laterality Date  ? ABDOMINAL HYSTERECTOMY    ? APPENDECTOMY  08/25/2017  ? bladder neurostimulator    ? COLONOSCOPY  1999  ? DILATION AND CURETTAGE OF UTERUS    ? FOOT SURGERY  2006  ? left  ? INCISION AND DRAINAGE ABSCESS N/A 11/19/2013  ? Procedure: POSTERIOR CULPOTOMY (INCISION AND DRAINAGE OF POST OPERATIVE CUFF HEMATOMA);   Surgeon: Jonnie Kind, MD;  Location: [facility];  Service: Gynecology;  Laterality: N/A;  ? LAPAROSCOPIC APPENDECTOMY N/A 08/26/2017  ? Procedure: APPENDECTOMY LAPAROSCOPIC;  Surgeon: Jackolyn Confer, MD;  Location: [facility];  Service: General;  Laterality: N/A;  ? PUBOVAGINAL SLING  03/24/2012  ? Procedure: Gaynelle Arabian;  Surgeon: Ailene Rud, MD;  Location: [facility];  Service: Urology;  Laterality: N/A;  lynx with [employer]   ? REMOVAL OF URINARY SLING    ? SVD    ? x 3  ? TONSILLECTOMY  as child  ? URETERAL EXPLORATION  5./30/13  ? VAGINAL HYSTERECTOMY N/A 1/2/015  ? [employer], Dr Linus Mako and Dr Leory Plowman, with release of Pubovag sling  ? ? ?Family History  ?Problem Relation Age of Onset  ? Cancer Mother 74  ?     Breast cancer  ? Thyroid disease Mother   ? Hypotension Mother   ? Other Father   ?     unsure of history  ? Anesthesia problems Neg Hx   ? ? ?Social History  ? ?Socioeconomic History  ? Marital status: Married  ?  Spouse name: Not on file  ? Number of children: 3  ? Years of education: Masters  ? Highest education level: Not  on file  ?Occupational History  ? Occupation: Tutoring part-time  ?Tobacco Use  ? Smoking status: Never  ? Smokeless tobacco: Never  ?Vaping Use  ? Vaping Use: Never used  ?Substance and Sexual Activity  ? Alcohol use: No  ? Drug use: No  ? Sexual activity: Not on file  ?Other Topics Concern  ? Not on file  ?Social History Narrative  ? Lives at home with husband and children.  ? Right-handed.  ? 2 cups caffeine per day.  ? ?Social Determinants of Health  ? ?Financial Resource Strain: Not on file  ?Food Insecurity: Not on file  ?Transportation Needs: Not on file  ?Physical Activity: Not on file  ?Stress: Not on file  ?Social Connections: Not on file  ?Intimate Partner Violence: Not on file  ? ? ?Outpatient Medications Prior to Visit  ?Medication Sig Dispense Refill  ? albuterol (VENTOLIN HFA) 108 (90 Base) MCG/ACT inhaler Inhale 2 puffs into the lungs every 4  (four) hours as needed for shortness of breath. 6.7 g 2  ? azelastine (ASTELIN) 0.1 % nasal spray Place 2 sprays into both nostrils 2 (two) times daily. Use in each nostril as directed 30 mL 12  ? buPROPion (WELLBUTRIN XL) 300 MG 24 hr tablet take '300mg'$  by mouth daily  2  ? clotrimazole-betamethasone (LOTRISONE) cream Apply 1 application topically 2 (two) times daily. 30 g 0  ? estradiol (VIVELLE-DOT) 0.025 MG/24HR Place 1 patch onto the skin 2 (two) times a week.    ? fluticasone (FLONASE) 50 MCG/ACT nasal spray Place 2 sprays into both nostrils daily. 48 g 2  ? fluticasone (FLOVENT HFA) 110 MCG/ACT inhaler Inhale 2 puffs into the lungs in the morning and at bedtime. Rinse mouth out after use. 1 each 1  ? gabapentin (NEURONTIN) 300 MG capsule Take 1 capsule (300 mg total) by mouth 3 (three) times daily. 270 capsule 2  ? levocetirizine (XYZAL) 5 MG tablet Take 1 tablet (5 mg total) by mouth every evening. 30 tablet 2  ? pantoprazole (PROTONIX) 40 MG tablet TAKE 1 TABLET(40 MG) BY MOUTH DAILY 90 tablet 2  ? prazosin (MINIPRESS) 2 MG capsule Take 2 mg by mouth at bedtime.    ? rizatriptan (MAXALT-MLT) 5 MG disintegrating tablet DISSOLVE ONE TABLET BY MOUTH AS NEEDED. MAY REPEAT IN 2 HOURS IF NEEDED. 10 tablet 11  ? fluconazole (DIFLUCAN) 150 MG tablet Take 1 tab, repeat in 72 hours if no improvement. 2 tablet 0  ? ?No facility-administered medications prior to visit.  ? ? ?Allergies  ?Allergen Reactions  ? Codeine Nausea And Vomiting  ? Penicillins Other (See Comments)  ?  Unknown- childhood reaction  ? ? ?Review of Systems  ?Gastrointestinal:  Positive for constipation.  ?Genitourinary:   ?     (+) Urinary incontinence  ?(+) Foul-smelling urine  ?Musculoskeletal:  Positive for back pain ([place]).  ? ?   ?Objective:  ?  ?Physical Exam ?Constitutional:   ?   General: She is not in acute distress. ?   Appearance: Normal appearance. She is not ill-appearing.  ?HENT:  ?   Head: Normocephalic and atraumatic.  ?   Right  Ear: External ear normal.  ?   Left Ear: External ear normal.  ?Eyes:  ?   Extraocular Movements: Extraocular movements intact.  ?   Pupils: Pupils are equal, round, and reactive to light.  ?Cardiovascular:  ?   Rate and Rhythm: Normal rate and regular rhythm.  ?   Heart  sounds: Normal heart sounds. No murmur heard. ?  No gallop.  ?Pulmonary:  ?   Effort: Pulmonary effort is normal. No respiratory distress.  ?   Breath sounds: Normal breath sounds. No wheezing or rales.  ?Abdominal:  ?   General: Bowel sounds are normal. There is no distension.  ?   Palpations: Abdomen is soft.  ?   Tenderness: There is abdominal tenderness in the right upper quadrant and right lower quadrant. There is right CVA tenderness and left CVA tenderness. There is no guarding.  ?Skin: ?   General: Skin is warm and dry.  ?Neurological:  ?   Mental Status: She is alert and oriented to person, place, and time.  ?Psychiatric:     ?   Mood and Affect: Mood normal.     ?   Behavior: Behavior normal.     ?   Judgment: Judgment normal.  ? ? ?BP 135/71 (BP Location: Right Arm, Patient Position: Sitting, Cuff Size: Small)   Pulse 85   Temp 98.4 ?F (36.9 ?C) (Oral)   Resp 16   Wt 170 lb (77.1 kg)   LMP 04/24/2012   SpO2 100%   BMI 31.09 kg/m?  ?Wt Readings from Last 3 Encounters:  ?01/20/22 170 lb (77.1 kg)  ?10/31/21 162 lb (73.5 kg)  ?10/03/21 164 lb 4 oz (74.5 kg)  ? ? ?   ?Assessment & Plan:  ? ?Problem List Items Addressed This Visit   ? ?  ? Unprioritized  ? Back pain - Primary  ?  Suspect musculoskeletal back pain.  Will give trial of medrol dose pak.  ?  ?  ? Relevant Medications  ? methylPREDNISolone (MEDROL DOSEPAK) 4 MG TBPK tablet  ? Other Relevant Orders  ? POCT Urinalysis Dipstick (Automated) (Completed)  ? Urine Culture  ? Abdominal pain  ?  Suspect that constipation is a contributing factor.  Will obtain KUB to evaluate for constipation and also evaluate for kidney stone.  UA is unremarkable, but will send for culture to exclude  UTI.  Clinically symptoms do not seem consistent with pyelonephritis.  ?  ?  ? Relevant Orders  ? DG Abd 2 Views (Completed)  ? ? ? ? ?Meds ordered this encounter  ?Medications  ? methylPREDNISolone (MEDROL DOSEP

## 2022-01-20 NOTE — Patient Instructions (Signed)
Please begin medrol dose pak (steroid medication). ?Complete x-ray on the first floor.  ?We will let you know how your urine culture turns out.  ?

## 2022-01-21 LAB — URINE CULTURE
MICRO NUMBER:: 13189906
Result:: NO GROWTH
SPECIMEN QUALITY:: ADEQUATE

## 2022-01-23 ENCOUNTER — Other Ambulatory Visit: Payer: Self-pay

## 2022-01-23 ENCOUNTER — Telehealth: Payer: Self-pay | Admitting: Family Medicine

## 2022-01-23 ENCOUNTER — Encounter (HOSPITAL_BASED_OUTPATIENT_CLINIC_OR_DEPARTMENT_OTHER): Payer: Self-pay

## 2022-01-23 ENCOUNTER — Emergency Department (HOSPITAL_BASED_OUTPATIENT_CLINIC_OR_DEPARTMENT_OTHER): Payer: 59

## 2022-01-23 ENCOUNTER — Emergency Department (HOSPITAL_BASED_OUTPATIENT_CLINIC_OR_DEPARTMENT_OTHER)
Admission: EM | Admit: 2022-01-23 | Discharge: 2022-01-23 | Disposition: A | Discharge status: Home / Self Care | Payer: 59 | Attending: Emergency Medicine | Admitting: Emergency Medicine

## 2022-01-23 DIAGNOSIS — D72829 Elevated white blood cell count, unspecified: Secondary | ICD-10-CM | POA: Diagnosis not present

## 2022-01-23 DIAGNOSIS — R109 Unspecified abdominal pain: Secondary | ICD-10-CM | POA: Diagnosis present

## 2022-01-23 LAB — COMPREHENSIVE METABOLIC PANEL
ALT: 29 U/L (ref 0–44)
AST: 21 U/L (ref 15–41)
Albumin: 4.2 g/dL (ref 3.5–5.0)
Alkaline Phosphatase: 64 U/L (ref 38–126)
Anion gap: 8 (ref 5–15)
BUN: 17 mg/dL (ref 6–20)
CO2: 26 mmol/L (ref 22–32)
Calcium: 9 mg/dL (ref 8.9–10.3)
Chloride: 104 mmol/L (ref 98–111)
Creatinine, Ser: 1.08 mg/dL — ABNORMAL HIGH (ref 0.44–1.00)
GFR, Estimated: >60 mL/min (ref >60)
Glucose, Bld: 108 mg/dL — ABNORMAL HIGH (ref 70–99)
Potassium: 4.3 mmol/L (ref 3.5–5.1)
Sodium: 138 mmol/L (ref 135–145)
Total Bilirubin: 0.5 mg/dL (ref 0.3–1.2)
Total Protein: 6.9 g/dL (ref 6.5–8.1)

## 2022-01-23 LAB — CBC WITH DIFFERENTIAL/PLATELET
Abs Immature Granulocytes: 0.01 10*3/uL (ref 0.00–0.07)
Basophils Absolute: 0 10*3/uL (ref 0.0–0.1)
Basophils Relative: 0 %
Eosinophils Absolute: 0 10*3/uL (ref 0.0–0.5)
Eosinophils Relative: 0 %
HCT: 40.5 % (ref 36.0–46.0)
Hemoglobin: 13.3 g/dL (ref 12.0–15.0)
Immature Granulocytes: 0 %
Lymphocytes Relative: 22 %
Lymphs Abs: 1.7 10*3/uL (ref 0.7–4.0)
MCH: 26.9 pg (ref 26.0–34.0)
MCHC: 32.8 g/dL (ref 30.0–36.0)
MCV: 81.8 fL (ref 80.0–100.0)
Monocytes Absolute: 0.4 10*3/uL (ref 0.1–1.0)
Monocytes Relative: 5 %
Neutro Abs: 5.5 10*3/uL (ref 1.7–7.7)
Neutrophils Relative %: 73 %
Platelets: 314 10*3/uL (ref 150–400)
RBC: 4.95 MIL/uL (ref 3.87–5.11)
RDW: 13.6 % (ref 11.5–15.5)
WBC: 7.6 10*3/uL (ref 4.0–10.5)
nRBC: 0 % (ref 0.0–0.2)

## 2022-01-23 LAB — URINALYSIS, MICROSCOPIC (REFLEX)

## 2022-01-23 LAB — URINALYSIS, ROUTINE W REFLEX MICROSCOPIC
Bilirubin Urine: NEGATIVE
Glucose, UA: NEGATIVE mg/dL
Ketones, ur: NEGATIVE mg/dL
Leukocytes,Ua: NEGATIVE
Nitrite: NEGATIVE
Protein, ur: NEGATIVE mg/dL
Specific Gravity, Urine: 1.01 (ref 1.005–1.030)
pH: 6.5 (ref 5.0–8.0)

## 2022-01-23 MED ORDER — METHOCARBAMOL 500 MG PO TABS: 500.0000 mg | ORAL_TABLET | Freq: Two times a day (BID) | ORAL | 0 refills | Status: DC

## 2022-01-23 MED ORDER — IOHEXOL 300 MG/ML  SOLN
100.0000 mL | Freq: Once | INTRAMUSCULAR | Status: AC | PRN
  Administered 2022-01-23: 100 mL via INTRAVENOUS

## 2022-01-23 NOTE — ED Provider Notes (Signed)
? ?[facility] EMERGENCY DEPARTMENT  ?Provider Note ? ?CSN: [id number] ?Arrival date & time: 01/23/22 0054 ? ?History ?Chief Complaint  ?Patient presents with  ? Flank Pain  ? ? ?Megan Gonzalez is a 51 y.o. female with prior history of occasional RLQ pains never diagnosed with anything specific reports she had a URI about a month ago and then a UTI which she took Abx for. She had recurrence of her UTI symptoms and took another round of Abx and the burning improved however she has continued to have R flank pain radiating into RLQ/groin that is different from her prior pains. She saw her PCP a couple of days ago urine and culture were negative. She was given Medrol for suspected MSK pain. KUB was neg. Pain had been more or less constant since then but flared up again while she was sleep prompting her to come to the ED. Resting more comfortably now. She reports a history of CKD (from taking too many NSAIDs) and is scheduled for her regular Renal follow up later today.  ? ? ?Home Medications ?Prior to Admission medications   ?Medication Sig Start Date End Date Taking? Authorizing Provider  ?methocarbamol (ROBAXIN) 500 MG tablet Take 1 tablet (500 mg total) by mouth 2 (two) times daily. 01/23/22  Yes Truddie Hidden, MD  ?albuterol (VENTOLIN HFA) 108 (90 Base) MCG/ACT inhaler Inhale 2 puffs into the lungs every 4 (four) hours as needed for shortness of breath. 10/04/20   Shelda Pal, DO  ?azelastine (ASTELIN) 0.1 % nasal spray Place 2 sprays into both nostrils 2 (two) times daily. Use in each nostril as directed 04/01/21   Shelda Pal, DO  ?buPROPion (WELLBUTRIN XL) 300 MG 24 hr tablet take '300mg'$  by mouth daily 11/26/16   [provider]  ?clotrimazole-betamethasone (LOTRISONE) cream Apply 1 application topically 2 (two) times daily. 09/16/21   Shelda Pal, DO  ?estradiol (VIVELLE-DOT) 0.025 MG/24HR Place 1 patch onto the skin 2 (two) times a week.    [provider]  ?fluticasone (FLONASE) 50 MCG/ACT nasal spray Place 2 sprays into both nostrils daily. 04/01/21   Shelda Pal, DO  ?fluticasone (FLOVENT HFA) 110 MCG/ACT inhaler Inhale 2 puffs into the lungs in the morning and at bedtime. Rinse mouth out after use. 04/01/21   Shelda Pal, DO  ?gabapentin (NEURONTIN) 300 MG capsule Take 1 capsule (300 mg total) by mouth 3 (three) times daily. 10/04/20   Shelda Pal, DO  ?levocetirizine (XYZAL) 5 MG tablet Take 1 tablet (5 mg total) by mouth every evening. 04/01/21   Shelda Pal, DO  ?methylPREDNISolone (MEDROL DOSEPAK) 4 MG TBPK tablet Please take per package instructions 01/20/22   Debbrah Alar, NP  ?pantoprazole (PROTONIX) 40 MG tablet TAKE 1 TABLET(40 MG) BY MOUTH DAILY 01/12/22   Shelda Pal, DO  ?prazosin (MINIPRESS) 2 MG capsule Take 2 mg by mouth at bedtime.    [provider]  ?rizatriptan (MAXALT-MLT) 5 MG disintegrating tablet DISSOLVE ONE TABLET BY MOUTH AS NEEDED. MAY REPEAT IN 2 HOURS IF NEEDED. 07/02/21   Shelda Pal, DO  ? ? ? ?Allergies    ?Codeine and Penicillins ? ? ?Review of Systems   ?Review of Systems ?Please see HPI for pertinent positives and negatives ? ?Physical Exam ?BP 105/74   Pulse (!) 59   Temp 98.2 ?F (36.8 ?C) (Oral)   Resp 15   Ht '5\' 2"'$  (1.575 m)   Wt 77.1 kg  LMP 04/24/2012   SpO2 98%   BMI 31.09 kg/m?  ? ?Physical Exam ?Vitals and nursing note reviewed.  ?Constitutional:   ?   Appearance: Normal appearance.  ?HENT:  ?   Head: Normocephalic and atraumatic.  ?   Nose: Nose normal.  ?   Mouth/Throat:  ?   Mouth: Mucous membranes are moist.  ?Eyes:  ?   Extraocular Movements: Extraocular movements intact.  ?   Conjunctiva/sclera: Conjunctivae normal.  ?Cardiovascular:  ?   Rate and Rhythm: Normal rate.  ?Pulmonary:  ?   Effort: Pulmonary effort is normal.  ?   Breath sounds: Normal breath sounds.  ?Abdominal:  ?   General: Abdomen is flat.  ?    Palpations: Abdomen is soft.  ?   Tenderness: There is no abdominal tenderness. There is no guarding.  ?Musculoskeletal:     ?   General: No swelling. Normal range of motion.  ?   Cervical back: Neck supple.  ?Skin: ?   General: Skin is warm and dry.  ?Neurological:  ?   General: No focal deficit present.  ?   Mental Status: She is alert.  ?Psychiatric:     ?   Mood and Affect: Mood normal.  ? ? ?ED Results / Procedures / Treatments   ?EKG ?None ? ?Procedures ?Procedures ? ?Medications Ordered in the ED ?Medications  ?iohexol (OMNIPAQUE) 300 MG/ML solution 100 mL (100 mLs Intravenous Contrast Given 01/23/22 0434)  ? ? ?Initial Impression and Plan ? Patient here with nonspecific Flank and abdominal pain. Exam is benign. Labs done in triage show mild leukocytosis, although recently started medrol. CMP including Cr is normal. UA is neg. Will send for CT to evaluate occult infectious process or renal stone.  ? ?ED Course  ? ?Clinical Course as of 01/23/22 0523  ?Fri Jan 23, 2022  ?0521 I personally viewed the images from radiology studies and agree with radiologist interpretation: CT neg for acute process. Patient has a full bladder but has been able to void without difficulty. Her pain is improved. Will trial Robaxin for suspected MSK pain. Follow up with her [facility]ist if she continues to have urinary symptoms.  ? [CS]  ?  ?Clinical Course User Index ?[CS] Truddie Hidden, MD  ? ? ? ?MDM Rules/Calculators/A&P ?Medical Decision Making ?Problems Addressed: ?Flank pain: acute illness or injury ? ?Amount and/or Complexity of Data Reviewed ?Labs: ordered. Decision-making details documented in ED Course. ?Radiology: ordered and independent interpretation performed. Decision-making details documented in ED Course. ? ?Risk ?Prescription drug management. ? ? ? ?Final Clinical Impression(s) / ED Diagnoses ?Final diagnoses:  ?Flank pain  ? ? ?Rx / DC Orders ?ED Discharge Orders   ? ?      Ordered  ?  methocarbamol  (ROBAXIN) 500 MG tablet  2 times daily       ? 01/23/22 0523  ? ?  ?  ? ?  ? ?  ?Truddie Hidden, MD ?01/23/22 [phone] ? ?

## 2022-01-23 NOTE — Telephone Encounter (Signed)
Blood work looked normal. Can discuss further at her ER follow up. Ty.  ?

## 2022-01-23 NOTE — ED Triage Notes (Signed)
Pt states pain started in R flank and radiated down side and hip and groin. She had xrays Tues and were clean. She states when she urinates it is when she feels most pain. Hx of uti's last 2 weeks and has been on antibiotic. Was told culture looks clear. Appt tomm with renal MD. Evelina Bucy some tylenol and ibuprofen, gabapentin and no improvement.  ?

## 2022-01-23 NOTE — ED Notes (Signed)
Patient transported to CT 

## 2022-01-23 NOTE — Telephone Encounter (Signed)
Pt would like for Wendling to review ed lab results and inform her via my chart results.  ? ?Pt expressed she may need medication prescribed.  ? ?Please advise. ?

## 2022-01-27 ENCOUNTER — Telehealth (INDEPENDENT_AMBULATORY_CARE_PROVIDER_SITE_OTHER): Payer: 59 | Admitting: Family Medicine

## 2022-01-27 ENCOUNTER — Encounter: Payer: Self-pay | Admitting: Family Medicine

## 2022-01-27 DIAGNOSIS — R109 Unspecified abdominal pain: Secondary | ICD-10-CM

## 2022-01-27 DIAGNOSIS — E7800 Pure hypercholesterolemia, unspecified: Secondary | ICD-10-CM

## 2022-01-27 NOTE — Progress Notes (Signed)
Chief Complaint  ?Patient presents with  ? Follow-up  ?  ED followup  ? ? ?Subjective: ?Patient is a 51 y.o. female here for ED f/u. Due to COVID-19 pandemic, we are interacting via web portal for an electronic face-to-face visit. I verified patient's ID using 2 identifiers. Patient agreed to proceed with visit via this method. Patient is at home, I am at office. Patient and I are present for visit.  ? ?On 01/23/2022, the patient was in the ED for flank pain.  Renal function was normal, CT abdomen/pelvis unremarkable for kidney issues.  She does not take anti-inflammatories as frequently due to kidney issues that caused her in the past.  Medrol Dosepak helped a little bit.  She has not been able to do her stretches and exercises as consistently.  No new bowel/bladder changes.  She has some chronic incontinence for which she follows specialist at [facility] for. ? ?Patient has a history of high cholesterol.  She came off her statin and is concerned she may need to go back on it.  She tolerated it well but never had it refilled when she transitioned physicians.  Now that she is 51 she would like this reassessed.  Diet is fair, she does not workout routinely.  No chest pain or shortness of breath. ? ? ?Past Medical History:  ?Diagnosis Date  ? Anemia   ? Anxiety   ? Arthritis   ? chronic low back pain  ? Cancer [facility])   ? cervical cancer; no more uterus  ? Depression   ? Fecal incontinence   ? Headache(784.0)   ? Numbness   ? Overactive bladder   ? Sleep apnea   ? CPAP-non compliant  ? Tremor   ? ? ?Objective: ?LMP 04/24/2012  ?No conversational dyspnea ?Age appropriate judgment and insight ?Nml affect and mood ? ?Assessment and Plan: ?Flank pain ? ?Pure hypercholesterolemia ? ?Seems MSK given ED workup. Will refer to PT. Stretches/exercises provided via [employer].  ?Chronic, not controlled. Will reck cholesterol at convenience. F/u pending results. May need to go back on statin.  ?The patient voiced understanding and agreement  to the plan. ? ?Shelda Pal, DO ?01/27/22  ?1:30 PM ? ? ? ? ?

## 2022-02-10 ENCOUNTER — Ambulatory Visit (INDEPENDENT_AMBULATORY_CARE_PROVIDER_SITE_OTHER): Payer: 59 | Admitting: Family Medicine

## 2022-02-10 ENCOUNTER — Encounter: Payer: Self-pay | Admitting: Family Medicine

## 2022-02-10 VITALS — BP 108/71 | HR 79 | Temp 98.0°F | Ht 62.0 in | Wt 169.2 lb

## 2022-02-10 DIAGNOSIS — W57XXXA Bitten or stung by nonvenomous insect and other nonvenomous arthropods, initial encounter: Secondary | ICD-10-CM | POA: Diagnosis not present

## 2022-02-10 DIAGNOSIS — S1086XA Insect bite of other specified part of neck, initial encounter: Secondary | ICD-10-CM

## 2022-02-10 MED ORDER — LEVOCETIRIZINE DIHYDROCHLORIDE 5 MG PO TABS: 5.0000 mg | ORAL_TABLET | Freq: Every evening | ORAL | 2 refills | Status: DC

## 2022-02-10 MED ORDER — TRIAMCINOLONE ACETONIDE 0.1 % EX CREA: 1.0000 "application " | TOPICAL_CREAM | Freq: Two times a day (BID) | CUTANEOUS | 0 refills | Status: DC

## 2022-02-10 NOTE — Patient Instructions (Signed)
Try not to scratch as this can make things worse. Avoid scented products while dealing with this. You may resume when the itchiness resolves. Cold/cool compresses can help.  ? ?Go back on the Xyzal. You can use Benadryl as needed. ? ?Let us know if you need anything. ?

## 2022-02-10 NOTE — Progress Notes (Signed)
Chief Complaint  ?Patient presents with  ? Rash  ? ? ?Megan Gonzalez is a 51 y.o. female here for a skin complaint. ? ?Duration: 3 days ?Location: arms, neck ?Pruritic? Yes ?Painful? Yes ?Drainage? No ?New soaps/lotions/topicals/detergents? No ?Sick contacts? Yes; family members ?Other associated symptoms: stayed in a hotel in [place], concerned for cleanliness ?Therapies tried thus far: OTC HC cream ? ?Past Medical History:  ?Diagnosis Date  ? Anemia   ? Anxiety   ? Arthritis   ? chronic low back pain  ? Cancer [facility])   ? cervical cancer; no more uterus  ? Depression   ? Fecal incontinence   ? Headache(784.0)   ? Numbness   ? Overactive bladder   ? Sleep apnea   ? CPAP-non compliant  ? Tremor   ? ? ?BP 108/71   Pulse 79   Temp 98 ?F (36.7 ?C) (Oral)   Ht '5\' 2"'$  (1.575 m)   Wt 169 lb 4 oz (76.8 kg)   LMP 04/24/2012   SpO2 99%   BMI 30.96 kg/m?  ?Gen: awake, alert, appearing stated age ?Lungs: No accessory muscle use ?Skin: Erythematous papules with a central lesion representing a bug bite noted on the forearms and along the neck.  There are breakfast, lunch, and dinner formation is noted along the neck.  There is no excoriation, pustular lesion, fluctuance, drainage, or excoriation. ?Psych: Age appropriate judgment and insight ? ?Bug bite, initial encounter - Plan: levocetirizine (XYZAL) 5 MG tablet, triamcinolone cream (KENALOG) 0.1 % ? ?Start daily antihistamine in addition to topical Kenalog.  Try not to scratch.  Cool compresses recommended.  Benadryl as needed.  She has not had any new lesions since putting her close through a wash and dryer cycle. ?F/u prn. ?The patient voiced understanding and agreement to the plan. ? ?Shelda Pal, DO ?02/10/22 ?4:10 PM ? ?

## 2022-02-12 ENCOUNTER — Telehealth (INDEPENDENT_AMBULATORY_CARE_PROVIDER_SITE_OTHER): Payer: 59 | Admitting: Family

## 2022-02-12 ENCOUNTER — Encounter: Payer: Self-pay | Admitting: Family

## 2022-02-12 VITALS — Ht 62.0 in | Wt 169.0 lb

## 2022-02-12 DIAGNOSIS — L299 Pruritus, unspecified: Secondary | ICD-10-CM | POA: Diagnosis not present

## 2022-02-12 DIAGNOSIS — Z91038 Other insect allergy status: Secondary | ICD-10-CM | POA: Diagnosis not present

## 2022-02-12 MED ORDER — PREDNISONE 20 MG PO TABS: ORAL_TABLET | ORAL | 0 refills | Status: DC

## 2022-02-12 MED ORDER — HYDROXYZINE PAMOATE 25 MG PO CAPS: 25.0000 mg | ORAL_CAPSULE | Freq: Three times a day (TID) | ORAL | 0 refills | Status: DC | PRN

## 2022-02-12 NOTE — Progress Notes (Signed)
?Megan Gonzalez is a 51 y.o. female with the following history as recorded in EpicCare:  ?Patient Active Problem List  ? Diagnosis Date Noted  ? Pure hypercholesterolemia 01/27/2022  ? Gastroesophageal reflux disease 01/12/2022  ? Dyslipidemia 10/04/2020  ? Seasonal allergies 03/10/2019  ? Vitamin D deficiency 03/10/2019  ? CKD (chronic kidney disease) stage 2, GFR 60-89 ml/min 09/08/2018  ? Edema 05/09/2018  ? Abdominal pain 08/25/2017  ? Chronic migraine 07/26/2017  ? Chronic low back pain without sciatica 07/26/2017  ? Gait abnormality 07/26/2017  ? Pain of left calf 10/25/2014  ? History of treatment by mental health professional 10/02/2014  ? Atypical chest pain 08/09/2014  ? Routine general medical examination at a health care facility 08/09/2014  ? Constipation, postoperative 11/19/2013  ? Pelvic pain in female 11/17/2013  ? Back pain 02/03/2012  ?  ?Current Outpatient Medications  ?Medication Sig Dispense Refill  ? albuterol (VENTOLIN HFA) 108 (90 Base) MCG/ACT inhaler Inhale 2 puffs into the lungs every 4 (four) hours as needed for shortness of breath. 6.7 g 2  ? azelastine (ASTELIN) 0.1 % nasal spray Place 2 sprays into both nostrils 2 (two) times daily. Use in each nostril as directed 30 mL 12  ? buPROPion (WELLBUTRIN XL) 300 MG 24 hr tablet take '300mg'$  by mouth daily  2  ? clotrimazole-betamethasone (LOTRISONE) cream Apply 1 application topically 2 (two) times daily. 30 g 0  ? estradiol (VIVELLE-DOT) 0.025 MG/24HR Place 1 patch onto the skin 2 (two) times a week.    ? fluticasone (FLONASE) 50 MCG/ACT nasal spray Place 2 sprays into both nostrils daily. 48 g 2  ? fluticasone (FLOVENT HFA) 110 MCG/ACT inhaler Inhale 2 puffs into the lungs in the morning and at bedtime. Rinse mouth out after use. 1 each 1  ? gabapentin (NEURONTIN) 300 MG capsule Take 1 capsule (300 mg total) by mouth 3 (three) times daily. 270 capsule 2  ? hydrOXYzine (VISTARIL) 25 MG capsule Take 1 capsule (25 mg total) by mouth every  8 (eight) hours as needed for itching. 30 capsule 0  ? levocetirizine (XYZAL) 5 MG tablet Take 1 tablet (5 mg total) by mouth every evening. 30 tablet 2  ? methocarbamol (ROBAXIN) 500 MG tablet Take 1 tablet (500 mg total) by mouth 2 (two) times daily. 20 tablet 0  ? pantoprazole (PROTONIX) 40 MG tablet TAKE 1 TABLET(40 MG) BY MOUTH DAILY 90 tablet 2  ? prazosin (MINIPRESS) 2 MG capsule Take 2 mg by mouth at bedtime.    ? predniSONE (DELTASONE) 20 MG tablet Take 2 tablets daily x 2 days, then decrease to 1 tablet daily x 5 days 10 tablet 0  ? rizatriptan (MAXALT-MLT) 5 MG disintegrating tablet DISSOLVE ONE TABLET BY MOUTH AS NEEDED. MAY REPEAT IN 2 HOURS IF NEEDED. 10 tablet 11  ? triamcinolone cream (KENALOG) 0.1 % Apply 1 application. topically 2 (two) times daily. 30 g 0  ? ?No current facility-administered medications for this visit.  ?  ?Allergies: Codeine and Penicillins  ?Past Medical History:  ?Diagnosis Date  ? Anemia   ? Anxiety   ? Arthritis   ? chronic low back pain  ? Cancer [facility])   ? cervical cancer; no more uterus  ? Depression   ? Fecal incontinence   ? Headache(784.0)   ? Numbness   ? Overactive bladder   ? Sleep apnea   ? CPAP-non compliant  ? Tremor   ?  ?Past Surgical History:  ?Procedure Laterality Date  ?  ABDOMINAL HYSTERECTOMY    ? APPENDECTOMY  08/25/2017  ? bladder neurostimulator    ? COLONOSCOPY  1999  ? DILATION AND CURETTAGE OF UTERUS    ? FOOT SURGERY  2006  ? left  ? INCISION AND DRAINAGE ABSCESS N/A 11/19/2013  ? Procedure: POSTERIOR CULPOTOMY (INCISION AND DRAINAGE OF POST OPERATIVE CUFF HEMATOMA);  Surgeon: Jonnie Kind, MD;  Location: [facility];  Service: Gynecology;  Laterality: N/A;  ? LAPAROSCOPIC APPENDECTOMY N/A 08/26/2017  ? Procedure: APPENDECTOMY LAPAROSCOPIC;  Surgeon: Jackolyn Confer, MD;  Location: [facility];  Service: General;  Laterality: N/A;  ? PUBOVAGINAL SLING  03/24/2012  ? Procedure: Gaynelle Arabian;  Surgeon: Ailene Rud, MD;  Location: [facility];  Service:  Urology;  Laterality: N/A;  lynx with [employer]   ? REMOVAL OF URINARY SLING    ? SVD    ? x 3  ? TONSILLECTOMY  as child  ? URETERAL EXPLORATION  5./30/13  ? VAGINAL HYSTERECTOMY N/A 1/2/015  ? [employer], Dr Linus Mako and Dr Leory Plowman, with release of Pubovag sling  ?  ?Family History  ?Problem Relation Age of Onset  ? Cancer Mother 22  ?     Breast cancer  ? Thyroid disease Mother   ? Hypotension Mother   ? Other Father   ?     unsure of history  ? Anesthesia problems Neg Hx   ?  ?Social History  ? ?Tobacco Use  ? Smoking status: Never  ? Smokeless tobacco: Never  ?Substance Use Topics  ? Alcohol use: No  ?  ?Subjective:  ? ? ?I connected with Megan Gonzalez on 02/12/22 at  3:20 PM EDT by a video enabled telemedicine application and verified that I am speaking with the correct person using two identifiers. ?  ?I discussed the limitations of evaluation and management by telemedicine and the availability of in person appointments. The patient expressed understanding and agreed to proceed. ?Provider in office/ patient is in her car; provider and patient are only 2 people on video call.  ? ?Seen 2 days ago with concerns for bed bugs; had video visit with her PCP and was told to use Xyzal and Benadryl; notes that itching is "unbearable" and feels like lesions are continue to erupt. Requesting "something else to treat the itching." ? ? ? ? ?Objective:  ?Vitals:  ? 02/12/22 1517  ?Weight: 169 lb (76.7 kg)  ?Height: '5\' 2"'$  (1.575 m)  ?  ?General: Well developed, well nourished, in no acute distress  ?Head: Normocephalic and atraumatic  ?Lungs: Respirations unlabored;  ?Neurologic: Alert and oriented; speech intact; face symmetrical;  ? ?Assessment:  ?1. Allergic reaction to insect bite   ?2. Itching   ?  ?Plan:  ?Change to Hydroxyzine 25 mg tid prn for itching; will also add short course of prednisone; will need to follow up in person if symptoms persist.  ? ? ? ?No follow-ups on file.  ?No orders of the  defined types were placed in this encounter. ?  ?Requested Prescriptions  ? ?Signed Prescriptions Disp Refills  ? hydrOXYzine (VISTARIL) 25 MG capsule 30 capsule 0  ?  Sig: Take 1 capsule (25 mg total) by mouth every 8 (eight) hours as needed for itching.  ? predniSONE (DELTASONE) 20 MG tablet 10 tablet 0  ?  Sig: Take 2 tablets daily x 2 days, then decrease to 1 tablet daily x 5 days  ?  ? ?

## 2022-02-23 ENCOUNTER — Other Ambulatory Visit: Payer: Self-pay | Admitting: Family Medicine

## 2022-02-23 ENCOUNTER — Encounter: Payer: Self-pay | Admitting: Family Medicine

## 2022-02-23 DIAGNOSIS — Z91038 Other insect allergy status: Secondary | ICD-10-CM

## 2022-02-23 DIAGNOSIS — L299 Pruritus, unspecified: Secondary | ICD-10-CM

## 2022-04-03 ENCOUNTER — Ambulatory Visit: Payer: Medicare Other | Admitting: Family Medicine

## 2022-05-14 ENCOUNTER — Other Ambulatory Visit: Payer: Self-pay | Admitting: Family Medicine

## 2022-05-14 ENCOUNTER — Other Ambulatory Visit (INDEPENDENT_AMBULATORY_CARE_PROVIDER_SITE_OTHER): Payer: 59

## 2022-05-14 DIAGNOSIS — E7800 Pure hypercholesterolemia, unspecified: Secondary | ICD-10-CM

## 2022-05-14 LAB — LIPID PANEL
Cholesterol: 181 mg/dL (ref 0–200)
HDL: 39.5 mg/dL (ref >39.00)
LDL Cholesterol: 112 mg/dL — ABNORMAL HIGH (ref 0–99)
NonHDL: 141.73
Total CHOL/HDL Ratio: 5
Triglycerides: 151 mg/dL — ABNORMAL HIGH (ref 0.0–149.0)
VLDL: 30.2 mg/dL (ref 0.0–40.0)

## 2022-05-25 ENCOUNTER — Ambulatory Visit: Payer: 59

## 2022-05-25 NOTE — Progress Notes (Deleted)
Subjective:   Megan Gonzalez is a 51 y.o. female who presents for an Initial Medicare Annual Wellness Visit.  I connected with Megan Gonzalez today by telephone and verified that I am speaking with the correct person using two identifiers. Location patient: home Location provider: work Persons participating in the virtual visit: patient, Marine scientist.    I discussed the limitations, risks, security and privacy concerns of performing an evaluation and management service by telephone and the availability of in person appointments. I also discussed with the patient that there may be a patient responsible charge related to this service. The patient expressed understanding and verbally consented to this telephonic visit.    Interactive audio and video telecommunications were attempted between this provider and patient, however failed, due to patient having technical difficulties OR patient did not have access to video capability.  We continued and completed visit with audio only.  Some vital signs may be absent or patient reported.   Time Spent with patient on telephone encounter: *** minutes   Review of Systems    ***       Objective:    There were no vitals filed for this visit. There is no height or weight on file to calculate BMI.     01/23/2022    1:02 AM 04/17/2020    8:06 AM 08/26/2017   10:14 AM 08/26/2017    2:00 AM 08/25/2017    6:06 PM 04/06/2016    6:48 PM 11/18/2013   12:36 AM  Advanced Directives  Does Patient Have a Medical Advance Directive? Yes No No No No No Patient does not have advance directive  Does patient want to make changes to medical advance directive?  No - Patient declined       Would patient like information on creating a medical advance directive?   No - Patient declined No - Patient declined  No - patient declined information     Current Medications (verified) Outpatient Encounter Medications as of 05/25/2022  Medication Sig   albuterol (VENTOLIN HFA) 108  (90 Base) MCG/ACT inhaler Inhale 2 puffs into the lungs every 4 (four) hours as needed for shortness of breath.   azelastine (ASTELIN) 0.1 % nasal spray Place 2 sprays into both nostrils 2 (two) times daily. Use in each nostril as directed   buPROPion (WELLBUTRIN XL) 300 MG 24 hr tablet take '300mg'$  by mouth daily   clotrimazole-betamethasone (LOTRISONE) cream Apply 1 application topically 2 (two) times daily.   estradiol (VIVELLE-DOT) 0.025 MG/24HR Place 1 patch onto the skin 2 (two) times a week.   fluticasone (FLONASE) 50 MCG/ACT nasal spray Place 2 sprays into both nostrils daily.   fluticasone (FLOVENT HFA) 110 MCG/ACT inhaler Inhale 2 puffs into the lungs in the morning and at bedtime. Rinse mouth out after use.   gabapentin (NEURONTIN) 300 MG capsule Take 1 capsule (300 mg total) by mouth 3 (three) times daily.   hydrOXYzine (VISTARIL) 25 MG capsule Take 1 capsule (25 mg total) by mouth every 8 (eight) hours as needed for itching.   levocetirizine (XYZAL) 5 MG tablet Take 1 tablet (5 mg total) by mouth every evening.   methocarbamol (ROBAXIN) 500 MG tablet Take 1 tablet (500 mg total) by mouth 2 (two) times daily.   pantoprazole (PROTONIX) 40 MG tablet TAKE 1 TABLET(40 MG) BY MOUTH DAILY   prazosin (MINIPRESS) 2 MG capsule Take 2 mg by mouth at bedtime.   rizatriptan (MAXALT-MLT) 5 MG disintegrating tablet DISSOLVE ONE TABLET BY MOUTH AS NEEDED.  MAY REPEAT IN 2 HOURS IF NEEDED.   triamcinolone cream (KENALOG) 0.1 % Apply 1 application. topically 2 (two) times daily.   No facility-administered encounter medications on file as of 05/25/2022.    Allergies (verified) Codeine and Penicillins   History: Past Medical History:  Diagnosis Date   Anemia    Anxiety    Arthritis    chronic low back pain   Cancer ([place])    cervical cancer; no more uterus   Depression    Fecal incontinence    Headache(784.0)    Numbness    Overactive bladder    Sleep apnea    CPAP-non compliant   Tremor     Past Surgical History:  Procedure Laterality Date   ABDOMINAL HYSTERECTOMY     APPENDECTOMY  08/25/2017   bladder neurostimulator     COLONOSCOPY  1999   DILATION AND CURETTAGE OF UTERUS     FOOT SURGERY  2006   left   INCISION AND DRAINAGE ABSCESS N/A 11/19/2013   Procedure: POSTERIOR CULPOTOMY (INCISION AND DRAINAGE OF POST OPERATIVE CUFF HEMATOMA);  Surgeon: Jonnie Kind, MD;  Location: [facility];  Service: Gynecology;  Laterality: N/A;   LAPAROSCOPIC APPENDECTOMY N/A 08/26/2017   Procedure: APPENDECTOMY LAPAROSCOPIC;  Surgeon: Jackolyn Confer, MD;  Location: [facility];  Service: General;  Laterality: N/A;   PUBOVAGINAL SLING  03/24/2012   Procedure: Gaynelle Arabian;  Surgeon: Ailene Rud, MD;  Location: [facility];  Service: Urology;  Laterality: N/A;  lynx with [employer]    REMOVAL OF URINARY SLING     SVD     x 3   TONSILLECTOMY  as child   URETERAL EXPLORATION  5./30/13   VAGINAL HYSTERECTOMY N/A 1/2/015   [employer], Dr Linus Mako and Dr Leory Plowman, with release of Pubovag sling   Family History  Problem Relation Age of Onset   Cancer Mother 59       Breast cancer   Thyroid disease Mother    Hypotension Mother    Other Father        unsure of history   Anesthesia problems Neg Hx    Social History   Socioeconomic History   Marital status: Married    Spouse name: Not on file   Number of children: 3   Years of education: Masters   Highest education level: Not on file  Occupational History   Occupation: Tutoring part-time  Tobacco Use   Smoking status: Never   Smokeless tobacco: Never  Vaping Use   Vaping Use: Never used  Substance and Sexual Activity   Alcohol use: No   Drug use: No   Sexual activity: Not on file  Other Topics Concern   Not on file  Social History Narrative   Lives at home with husband and children.   Right-handed.   2 cups caffeine per day.   Social Determinants of Health   Financial Resource Strain: Not on file  Food  Insecurity: Not on file  Transportation Needs: Not on file  Physical Activity: Not on file  Stress: Not on file  Social Connections: Not on file    Tobacco Counseling Counseling given: Not Answered   Clinical Intake:                 Diabetic?No         Activities of Daily Living     No data to display          Patient Care Team: Shelda Pal, DO  as PCP - General (Family Medicine) Carol Ada, MD as Consulting Physician (Gastroenterology) Otilio Carpen, MD as Consulting Physician (Obstetrics and Gynecology)  Indicate any recent Medical Services you may have received from other than [facility] providers in the past year (date may be approximate).     Assessment:   This is a routine wellness examination for Megan Gonzalez.  Hearing/Vision screen No results found.  Dietary issues and exercise activities discussed:     Goals Addressed   None    Depression Screen    02/12/2022    3:18 PM 06/27/2021    1:23 PM 04/01/2021    8:47 AM 06/17/2017   12:01 PM  PHQ 2/9 Scores  PHQ - 2 Score 0 0 4 2  PHQ- 9 Score   8 10    Fall Risk    02/12/2022    3:18 PM 10/31/2021    2:59 PM 06/27/2021    1:23 PM 04/01/2021    8:46 AM  Fall Risk   Falls in the past year? 0 0 0 1  Number falls in past yr: 0 0 0 1  Injury with Fall? 0 0 0 1  Risk for fall due to : No Fall Risks No Fall Risks No Fall Risks History of fall(s)  Follow up Falls evaluation completed Falls evaluation completed Falls evaluation completed Falls evaluation completed    [place]:  Any stairs in or around the home? {YES/NO:21197} If so, are there any without handrails? {YES/NO:21197} Home free of loose throw rugs in walkways, pet beds, electrical cords, etc? {YES/NO:21197} Adequate lighting in your home to reduce risk of falls? {YES/NO:21197}  ASSISTIVE DEVICES UTILIZED TO PREVENT FALLS:  Life alert? {YES/NO:21197} Use of a cane, walker or w/c?  {YES/NO:21197} Grab bars in the bathroom? {YES/NO:21197} Shower chair or bench in shower? {YES/NO:21197} Elevated toilet seat or a handicapped toilet? {YES/NO:21197}  TIMED UP AND GO:  Was the test performed? No . Phone visit   Cognitive Function:        Immunizations Immunization History  Administered Date(s) Administered   Hepatitis B, ped/adol 01/13/2014   Influenza,inj,Quad PF,6+ Mos 08/09/2014, 08/27/2017, 09/08/2018, 09/12/2019, 10/04/2020, 10/10/2021   PFIZER(Purple Top)SARS-COV-2 Vaccination 12/22/2019, 01/13/2020   Tdap 10/04/2020    TDAP status: Up to date  Flu Vaccine status: Up to date  Pneumococcal vaccine status: Due at age 63  Covid-19 vaccine status: Information provided on how to obtain vaccines.   Qualifies for Shingles Vaccine? Yes   Zostavax completed No   Shingrix Completed?: No.    Education has been provided regarding the importance of this vaccine. Patient has been advised to call insurance company to determine out of pocket expense if they have not yet received this vaccine. Advised may also receive vaccine at local pharmacy or Health Dept. Verbalized acceptance and understanding.  Screening Tests Health Maintenance  Topic Date Due   MAMMOGRAM  08/10/2018   Zoster Vaccines- Shingrix (1 of 2) Never done   INFLUENZA VACCINE  05/26/2022   COLONOSCOPY (Pts 45-5yr Insurance coverage will need to be confirmed)  10/03/2028   TETANUS/TDAP  10/04/2030   Hepatitis C Screening  Completed   HIV Screening  Completed   HPV VACCINES  Aged Out   COVID-19 Vaccine  Discontinued    Health Maintenance  Health Maintenance Due  Topic Date Due   MAMMOGRAM  08/10/2018   Zoster Vaccines- Shingrix (1 of 2) Never done    Colorectal cancer screening: Type of screening: Colonoscopy.  Completed 10/03/2018. Repeat every 10 years  {Mammogram status:21018020}  Bone Density status: Due at age 38  Lung Cancer Screening: (Low Dose CT Chest recommended if Age 15-80  years, 30 pack-year currently smoking OR have quit w/in 15years.) does not qualify.     Additional Screening:  Hepatitis C Screening: Completed 10/04/2020  Vision Screening: Recommended annual ophthalmology exams for early detection of glaucoma and other disorders of the eye. Is the patient up to date with their annual eye exam?  {YES/NO:21197} Who is the provider or what is the name of the office in which the patient attends annual eye exams? *** If pt is not established with a provider, would they like to be referred to a provider to establish care? {YES/NO:21197}.   Dental Screening: Recommended annual dental exams for proper oral hygiene  Community Resource Referral / Chronic Care Management: CRR required this visit?  {YES/NO:21197}  CCM required this visit?  {YES/NO:21197}     Plan:     I have personally reviewed and noted the following in the patient's chart:   Medical and social history Use of alcohol, tobacco or illicit drugs  Current medications and supplements including opioid prescriptions. {Opioid [phone]} Functional ability and status Nutritional status Physical activity Advanced directives List of other physicians Hospitalizations, surgeries, and ER visits in previous 12 months Vitals Screenings to include cognitive, depression, and falls Referrals and appointments  In addition, I have reviewed and discussed with patient certain preventive protocols, quality metrics, and best practice recommendations. A written personalized care plan for preventive services as well as general preventive health recommendations were provided to patient.   Due to this being a telephonic visit, the after visit summary with patients personalized plan was offered to patient via mail or my-chart. Patient would like to access on my-chart.   Marta Antu, LPN   [id number]  Nurse Health Advisor  Nurse Notes: ***

## 2022-07-07 ENCOUNTER — Encounter: Payer: Self-pay | Admitting: Family Medicine

## 2022-07-07 ENCOUNTER — Ambulatory Visit: Payer: Medicare Other | Admitting: Family Medicine

## 2022-07-07 VITALS — BP 120/80 | HR 102 | Temp 98.2°F | Ht 62.0 in | Wt 161.2 lb

## 2022-07-07 DIAGNOSIS — G8929 Other chronic pain: Secondary | ICD-10-CM

## 2022-07-07 DIAGNOSIS — M25551 Pain in right hip: Secondary | ICD-10-CM

## 2022-07-07 MED ORDER — METHOCARBAMOL 500 MG PO TABS: 500.0000 mg | ORAL_TABLET | Freq: Two times a day (BID) | ORAL | 0 refills | Status: DC

## 2022-07-07 MED ORDER — PREDNISONE 20 MG PO TABS: 40.0000 mg | ORAL_TABLET | Freq: Every day | ORAL | 0 refills | Status: AC

## 2022-07-07 MED ORDER — METHYLPREDNISOLONE ACETATE 80 MG/ML IJ SUSP
80.0000 mg | Freq: Once | INTRAMUSCULAR | Status: AC
  Administered 2022-07-07: 80 mg via INTRAMUSCULAR

## 2022-07-07 NOTE — Patient Instructions (Signed)
Go to [facility] for your Xray. Go to the basement and tell the radiology team your name/DOB.  Ice/cold pack over area for 10-15 min twice daily.  Heat (pad or rice pillow in microwave) over affected area, 10-15 minutes twice daily.   OK to take Tylenol 1000 mg (2 extra strength tabs) or 975 mg (3 regular strength tabs) every 6 hours as needed.  Let us know if you need anything.  Hip Exercises It is normal to feel mild stretching, pulling, tightness, or discomfort as you do these exercises, but you should stop right away if you feel sudden pain or your pain gets worse.   STRETCHING AND RANGE OF MOTION EXERCISES These exercises warm up your muscles and joints and improve the movement and flexibility of your hip. These exercises also help to relieve pain, numbness, and tingling. Exercise A: Hamstrings, Supine  Lie on your back. Loop a belt or towel over the ball of your left / right foot. The ball of your foot is on the walking surface, right under your toes. Straighten your left / right knee and slowly pull on the belt to raise your leg. Do not let your left / right knee bend while you do this. Keep your other leg flat on the floor. Raise the left / right leg until you feel a gentle stretch behind your left / right knee or thigh. Hold this position for 30 seconds. Slowly return your leg to the starting position. Repeat2 times. Complete this stretch 3 times per week. Exercise B: Hip Rotators  Lie on your back on a firm surface. Hold your left / right knee with your left / right hand. Hold your ankle with your other hand. Gently pull your left / right knee and rotate your lower leg toward your other shoulder. Pull until you feel a stretch in your buttocks. Keep your hips and shoulders firmly planted while you do this stretch. Hold this position for 30 seconds. Repeat 2 times. Complete this stretch 3 times per week. Exercise C: V-Sit (Hamstrings and Adductors)  Sit on the floor with  your legs extended in a large "V" shape. Keep your knees straight during this exercise. Start with your head and chest upright, then bend at your waist to reach for your left foot (position A). You should feel a stretch in your right inner thigh. Hold this position for 30 seconds. Then slowly return to the upright position. Bend at your waist to reach forward (position B). You should feel a stretch behind both of your thighs and knees. Hold this position for 30 seconds. Then slowly return to the upright position. Bend at your waist to reach for your right foot (position C). You should feel a stretch in your left inner thigh. Hold this position for 30 seconds. Then slowly return to the upright position. Repeat A, B, and C 2 times each. Complete this stretch 3 times per week. Exercise D: Lunge (Hip Flexors)  Place your left / right knee on the floor and bend your other knee so that is directly over your ankle. You should be half-kneeling. Keep good posture with your head over your shoulders. Tighten your buttocks to point your tailbone downward. This helps your back to keep from arching too much. You should feel a gentle stretch in the front of your left / right thigh and hip. If you do not feel any resistance, slightly slide your other foot forward and then slowly lunge forward so your knee once  again lines up over your ankle. Make sure your tailbone continues to point downward. Hold this position for 30 seconds. Repeat 2 times. Complete this stretch 3 times per week.  STRENGTHENING EXERCISES These exercises build strength and endurance in your hip. Endurance is the ability to use your muscles for a long time, even after they get tired. Exercise E: Bridge (Hip Extensors)  Lie on your back on a firm surface with your knees bent and your feet flat on the floor. Tighten your buttocks muscles and lift your bottom off the floor until the trunk of your body is level with your thighs. Do not arch  your back. You should feel the muscles working in your buttocks and the back of your thighs. If you do not feel these muscles, slide your feet 1-2 inches (2.5-5 cm) farther away from your buttocks. Hold this position for 3 seconds. Slowly lower your hips to the starting position. Repeat for a total of 10 repetitions. Let your muscles relax completely between repetitions. If this exercise is too easy, try doing it with your arms crossed over your chest. Repeat 2 times. Complete this exercise 3 times per week. Exercise F: Straight Leg Raises - Hip Abductors  Lie on your side with your left / right leg in the top position. Lie so your head, shoulder, knee, and hip line up with each other. You may bend your bottom knee to help you balance. Roll your hips slightly forward, so your hips are stacked directly over each other and your left / right knee is facing forward. Leading with your heel, lift your top leg 4-6 inches (10-15 cm). You should feel the muscles in your outer hip lifting. Do not let your foot drift forward. Do not let your knee roll toward the ceiling. Hold this position for 1 second. Slowly return to the starting position. Let your muscles relax completely between repetitions. Repeat for a total of 10 repetitions.  Repeat 2 times. Complete this exercise 3 times per week. Exercise G: Straight Leg Raises - Hip Adductors  Lie on your side with your left / right leg in the bottom position. Lie so your head, shoulder, knee, and hip line up. You may place your upper foot in front to help you balance. Roll your hips slightly forward, so your hips are stacked directly over each other and your left / right knee is facing forward. Tense the muscles in your inner thigh and lift your bottom leg 4-6 inches (10-15 cm). Hold this position for 1 second. Slowly return to the starting position. Let your muscles relax completely between repetitions. Repeat for a total of 10 repetitions. Repeat 2  times. Complete this exercise 3 times per week. Exercise H: Straight Leg Raises - Quadriceps  Lie on your back with your left / right leg extended and your other knee bent. Tense the muscles in the front of your left / right thigh. When you do this, you should see your kneecap slide up or see increased dimpling just above your knee. Tighten these muscles even more and raise your leg 4-6 inches (10-15 cm) off the floor. Hold this position for 3 seconds. Keep these muscles tense as you lower your leg. Relax the muscles slowly and completely between repetitions. Repeat for a total of 10 repetitions. Repeat 2 times. Complete this exercise 3 times per week. Exercise I: Hip Abductors, Standing Tie one end of a rubber exercise band or tubing to a secure surface, such as a table or  pole. Loop the other end of the band or tubing around your left / right ankle. Keeping your ankle with the band or tubing directly opposite of the secured end, step away until there is tension in the tubing or band. Hold onto a chair as needed for balance. Lift your left / right leg out to your side. While you do this: Keep your back upright. Keep your shoulders over your hips. Keep your toes pointing forward. Make sure to use your hip muscles to lift your leg. Do not "throw" your leg or tip your body to lift your leg. Hold this position for 1 second. Slowly return to the starting position. Repeat for a total of 10 repetitions. Repeat 2 times. Complete this exercise 3 times per week. Exercise J: Squats (Quadriceps) Stand in a door frame so your feet and knees are in line with the frame. You may place your hands on the frame for balance. Slowly bend your knees and lower your hips like you are going to sit in a chair. Keep your lower legs in a straight-up-and-down position. Do not let your hips go lower than your knees. Do not bend your knees lower than told by your health care provider. If your hip pain increases, do  not bend as low. Hold this position for 1 second. Slowly push with your legs to return to standing. Do not use your hands to pull yourself to standing. Repeat for a total of 10 repetitions. Repeat 2 times. Complete this exercise 3 times per week. Make sure you discuss any questions you have with your health care provider. Document Released: 10/30/2005 Document Revised: 07/06/2016 Document Reviewed: 10/07/2015 Elsevier Interactive Patient Education  [employer].

## 2022-07-07 NOTE — Progress Notes (Signed)
Musculoskeletal Exam  Patient: Megan Gonzalez DOB: 12-06-1970  DOS: 07/07/2022  SUBJECTIVE:  Chief Complaint:   Chief Complaint  Patient presents with   Flank Pain    right    Megan Gonzalez is a 51 y.o.  female for evaluation and treatment of R anterior hip pain.   Onset:  12  years  ago. No inj or change in activity.  Location: R anterior hip Character:  sharp  Progression of issue:  worsened recently Associated symptoms: cant stand up straight without pain No bruising, catching/locking of hip, redness, swelling Treatment: to date has been rest, ice, OTC NSAIDS, acetaminophen, and PT.   Neurovascular symptoms: no  Past Medical History:  Diagnosis Date   Anemia    Anxiety    Arthritis    chronic low back pain   Cancer (HCC)    cervical cancer; no more uterus   Depression    Fecal incontinence    Headache(784.0)    Numbness    Overactive bladder    Sleep apnea    CPAP-non compliant   Tremor     Objective: VITAL SIGNS: BP 120/80   Pulse (!) 102   Temp 98.2 F (36.8 C) (Oral)   Ht '5\' 2"'$  (1.575 m)   Wt 161 lb 4 oz (73.1 kg)   LMP 04/24/2012   SpO2 93%   BMI 29.49 kg/m  Constitutional: Well formed, well developed. No acute distress. Thorax & Lungs: No accessory muscle use Musculoskeletal: R hip.   Normal active range of motion: yes.   Normal passive range of motion: yes Tenderness to palpation: yes over R hip flexor and R SI jt No ttp over greater troch or other bony landmarks No ttp over lumbar spine/parasp msc b/l Deformity: no Ecchymosis: no Tests positive: Stinchfield Tests negative: logroll, straight leg, FABER, FADDIR Neurologic: Normal sensory function. No focal deficits noted. DTR's equal and symmetric in LE's. No clonus. 5/5 strength in LE's b/l. Gait is antalgic.  Psychiatric: Normal mood. Age appropriate judgment and insight. Alert & oriented x 3.    Assessment:  Chronic right hip pain - Plan: methocarbamol (ROBAXIN) 500 MG  tablet, predniSONE (DELTASONE) 20 MG tablet, DG HIP UNILAT WITH PELVIS 2-3 VIEWS RIGHT, methylPREDNISolone acetate (DEPO-MEDROL) injection 80 mg  Plan: Exacerbation of chronic issue. Ck XR, will go to [facility] location, address given in AVS. Depo injection today, 5 d pred burst starting tomorrow. Robaxin prn, she has done well with this in past. Stretches/exercises, heat, ice, Tylenol.  F/u in 3 mo for CPE or prn. The patient voiced understanding and agreement to the plan.   [place], DO 07/07/22  3:50 PM

## 2022-08-28 ENCOUNTER — Encounter: Payer: Self-pay | Admitting: Family Medicine

## 2022-08-28 ENCOUNTER — Telehealth (INDEPENDENT_AMBULATORY_CARE_PROVIDER_SITE_OTHER): Payer: 59 | Admitting: Family Medicine

## 2022-08-28 DIAGNOSIS — H9201 Otalgia, right ear: Secondary | ICD-10-CM | POA: Diagnosis not present

## 2022-08-28 DIAGNOSIS — Z1231 Encounter for screening mammogram for malignant neoplasm of breast: Secondary | ICD-10-CM | POA: Diagnosis not present

## 2022-08-28 MED ORDER — FLUCONAZOLE 150 MG PO TABS: ORAL_TABLET | ORAL | 0 refills | Status: DC

## 2022-08-28 MED ORDER — PREDNISONE 20 MG PO TABS: 40.0000 mg | ORAL_TABLET | Freq: Every day | ORAL | 0 refills | Status: AC

## 2022-08-28 MED ORDER — AZITHROMYCIN 250 MG PO TABS: ORAL_TABLET | ORAL | 0 refills | Status: DC

## 2022-08-28 NOTE — Progress Notes (Signed)
CC: Ear pain  Megan Gonzalez here for URI complaints. Due to COVID-19 pandemic, we are interacting via web portal for an electronic face-to-face visit. I verified patient's ID using 2 identifiers. Patient agreed to proceed with visit via this method. Patient is at work, I am at office. Patient and I are present for visit.   Duration: 4 days  Associated symptoms: sinus congestion, R>L ear pain, and eye drainage Denies: sinus pain, rhinorrhea, itchy watery eyes, ear drainage, sore throat, wheezing, shortness of breath, myalgia, and coughing, fevers Treatment to date: Vit C,  Tylenol Sinus Sick contacts: No  Past Medical History:  Diagnosis Date   Anemia    Anxiety    Arthritis    chronic low back pain   Cancer ([place])    cervical cancer; no more uterus   Depression    Fecal incontinence    Headache(784.0)    Numbness    Overactive bladder    Sleep apnea    CPAP-non compliant   Tremor     Objective No conversational dyspnea Age appropriate judgment and insight Nml affect and mood  Right ear pain - Plan: predniSONE (DELTASONE) 20 MG tablet, fluconazole (DIFLUCAN) 150 MG tablet, azithromycin (ZITHROMAX) 250 MG tablet  Encounter for screening mammogram for malignant neoplasm of breast - Plan: MM DIGITAL SCREENING BILATERAL  5 d pred burst to tx for ETD/middle ear effusion. If no improvement in 2 d, will take Zpak to cover for infectious causes. Diflucan should it cause a yeast infection.  Mammogram ordered.  Pt voiced understanding and agreement to the plan.  [place], DO 08/28/22 9:26 AM

## 2022-10-20 ENCOUNTER — Telehealth (HOSPITAL_BASED_OUTPATIENT_CLINIC_OR_DEPARTMENT_OTHER): Payer: Self-pay

## 2022-11-26 ENCOUNTER — Telehealth (INDEPENDENT_AMBULATORY_CARE_PROVIDER_SITE_OTHER): Payer: 59 | Admitting: Family

## 2022-11-26 ENCOUNTER — Encounter: Payer: Self-pay | Admitting: Family

## 2022-11-26 VITALS — Ht 62.0 in | Wt 150.0 lb

## 2022-11-26 DIAGNOSIS — J019 Acute sinusitis, unspecified: Secondary | ICD-10-CM

## 2022-11-26 MED ORDER — DOXYCYCLINE HYCLATE 100 MG PO TABS: 100.0000 mg | ORAL_TABLET | Freq: Two times a day (BID) | ORAL | 0 refills | Status: DC

## 2022-11-26 NOTE — Progress Notes (Signed)
Megan Gonzalez is a 52 y.o. female with the following history as recorded in EpicCare:  Patient Active Problem List   Diagnosis Date Noted   Pure hypercholesterolemia 01/27/2022   Gastroesophageal reflux disease 01/12/2022   Dyslipidemia 10/04/2020   Seasonal allergies 03/10/2019   Vitamin D deficiency 03/10/2019   CKD (chronic kidney disease) stage 2, GFR 60-89 ml/min 09/08/2018   Edema 05/09/2018   Abdominal pain 08/25/2017   Chronic migraine 07/26/2017   Chronic low back pain without sciatica 07/26/2017   Gait abnormality 07/26/2017   Pain of left calf 10/25/2014   History of treatment by mental health professional 10/02/2014   Atypical chest pain 08/09/2014   Routine general medical examination at a health care facility 08/09/2014   Constipation, postoperative 11/19/2013   Pelvic pain in female 11/17/2013   Back pain 02/03/2012    Current Outpatient Medications  Medication Sig Dispense Refill   albuterol (VENTOLIN HFA) 108 (90 Base) MCG/ACT inhaler Inhale 2 puffs into the lungs every 4 (four) hours as needed for shortness of breath. 6.7 g 2   azelastine (ASTELIN) 0.1 % nasal spray Place 2 sprays into both nostrils 2 (two) times daily. Use in each nostril as directed 30 mL 12   buPROPion (WELLBUTRIN XL) 300 MG 24 hr tablet take '300mg'$  by mouth daily  2   clotrimazole-betamethasone (LOTRISONE) cream Apply 1 application topically 2 (two) times daily. 30 g 0   doxycycline (VIBRA-TABS) 100 MG tablet Take 1 tablet (100 mg total) by mouth 2 (two) times daily. 14 tablet 0   estradiol (VIVELLE-DOT) 0.025 MG/24HR Place 1 patch onto the skin 2 (two) times a week.     fluticasone (FLONASE) 50 MCG/ACT nasal spray Place 2 sprays into both nostrils daily. 48 g 2   fluticasone (FLOVENT HFA) 110 MCG/ACT inhaler Inhale 2 puffs into the lungs in the morning and at bedtime. Rinse mouth out after use. 1 each 1   gabapentin (NEURONTIN) 300 MG capsule Take 1 capsule (300 mg total) by mouth 3  (three) times daily. 270 capsule 2   hydrOXYzine (VISTARIL) 25 MG capsule Take 1 capsule (25 mg total) by mouth every 8 (eight) hours as needed for itching. 30 capsule 0   levocetirizine (XYZAL) 5 MG tablet Take 1 tablet (5 mg total) by mouth every evening. 30 tablet 2   methocarbamol (ROBAXIN) 500 MG tablet Take 1 tablet (500 mg total) by mouth 2 (two) times daily. 30 tablet 0   pantoprazole (PROTONIX) 40 MG tablet TAKE 1 TABLET(40 MG) BY MOUTH DAILY 90 tablet 2   prazosin (MINIPRESS) 2 MG capsule Take 2 mg by mouth at bedtime.     rizatriptan (MAXALT-MLT) 5 MG disintegrating tablet DISSOLVE ONE TABLET BY MOUTH AS NEEDED. MAY REPEAT IN 2 HOURS IF NEEDED. 10 tablet 11   triamcinolone cream (KENALOG) 0.1 % Apply 1 application. topically 2 (two) times daily. 30 g 0   No current facility-administered medications for this visit.    Allergies: Codeine and Penicillins  Past Medical History:  Diagnosis Date   Anemia    Anxiety    Arthritis    chronic low back pain   Cancer ([place])    cervical cancer; no more uterus   Depression    Fecal incontinence    Headache(784.0)    Numbness    Overactive bladder    Sleep apnea    CPAP-non compliant   Tremor     Past Surgical History:  Procedure Laterality Date   ABDOMINAL HYSTERECTOMY  APPENDECTOMY  08/25/2017   bladder neurostimulator     COLONOSCOPY  1999   DILATION AND CURETTAGE OF UTERUS     FOOT SURGERY  2006   left   INCISION AND DRAINAGE ABSCESS N/A 11/19/2013   Procedure: POSTERIOR CULPOTOMY (INCISION AND DRAINAGE OF POST OPERATIVE CUFF HEMATOMA);  Surgeon: Jonnie Kind, MD;  Location: [facility];  Service: Gynecology;  Laterality: N/A;   LAPAROSCOPIC APPENDECTOMY N/A 08/26/2017   Procedure: APPENDECTOMY LAPAROSCOPIC;  Surgeon: Jackolyn Confer, MD;  Location: [facility];  Service: General;  Laterality: N/A;   PUBOVAGINAL SLING  03/24/2012   Procedure: Gaynelle Arabian;  Surgeon: Ailene Rud, MD;  Location: [facility];  Service:  Urology;  Laterality: N/A;  lynx with [employer]    REMOVAL OF URINARY SLING     SVD     x 3   TONSILLECTOMY  as child   URETERAL EXPLORATION  5./30/13   VAGINAL HYSTERECTOMY N/A 1/2/015   [employer], Dr Linus Mako and Dr Leory Plowman, with release of Pubovag sling    Family History  Problem Relation Age of Onset   Cancer Mother 64       Breast cancer   Thyroid disease Mother    Hypotension Mother    Other Father        unsure of history   Anesthesia problems Neg Hx     Social History   Tobacco Use   Smoking status: Never   Smokeless tobacco: Never  Substance Use Topics   Alcohol use: No    Subjective:   I connected with Megan Gonzalez on 11/26/22 at 10:20 AM EST by a video enabled telemedicine application and verified that I am speaking with the correct person using two identifiers.   I discussed the limitations of evaluation and management by telemedicine and the availability of in person appointments. The patient expressed understanding and agreed to proceed.  Provider in office/ patient is at her place of employment; provider and patient are only 2 people on video call.    2 week history of ear pressure/ sinus pain/ pressure; seemed to start after returning from trip to [place]; using her Flonase and Astelin;       Objective:  Vitals:   11/26/22 1018  Weight: 150 lb (68 kg)  Height: '5\' 2"'$  (1.575 m)    General: Well developed, well nourished, in no acute distress  Skin : Warm and dry.  Head: Normocephalic and atraumatic  Lungs: Respirations unlabored;  Neurologic: Alert and oriented; speech intact; face symmetrical; moves all extremities well; CNII-XII intact without focal deficit   Assessment:  1. Acute sinusitis, recurrence not specified, unspecified location     Plan:  Rx for Doxycycline 100 mg bid x 7 days; continue Flonase and Astelin; increase fluids, rest and follow up worse, no better. She will need to be seen in person if symptoms persist.    No follow-ups on file.  No orders of the defined types were placed in this encounter.   Requested Prescriptions   Signed Prescriptions Disp Refills   doxycycline (VIBRA-TABS) 100 MG tablet 14 tablet 0    Sig: Take 1 tablet (100 mg total) by mouth 2 (two) times daily.

## 2022-12-01 ENCOUNTER — Encounter: Payer: Self-pay | Admitting: Family Medicine

## 2022-12-01 ENCOUNTER — Ambulatory Visit (INDEPENDENT_AMBULATORY_CARE_PROVIDER_SITE_OTHER): Payer: 59 | Admitting: Family Medicine

## 2022-12-01 VITALS — BP 108/68 | HR 92 | Temp 98.2°F | Ht 62.0 in | Wt 164.1 lb

## 2022-12-01 DIAGNOSIS — J302 Other seasonal allergic rhinitis: Secondary | ICD-10-CM

## 2022-12-01 DIAGNOSIS — K219 Gastro-esophageal reflux disease without esophagitis: Secondary | ICD-10-CM

## 2022-12-01 DIAGNOSIS — Z0000 Encounter for general adult medical examination without abnormal findings: Secondary | ICD-10-CM

## 2022-12-01 DIAGNOSIS — W57XXXA Bitten or stung by nonvenomous insect and other nonvenomous arthropods, initial encounter: Secondary | ICD-10-CM

## 2022-12-01 MED ORDER — FLUTICASONE PROPIONATE 50 MCG/ACT NA SUSP: 2.0000 | Freq: Every day | NASAL | 2 refills | Status: DC

## 2022-12-01 MED ORDER — PANTOPRAZOLE SODIUM 40 MG PO TBEC: DELAYED_RELEASE_TABLET | ORAL | 2 refills | Status: DC

## 2022-12-01 MED ORDER — LEVOCETIRIZINE DIHYDROCHLORIDE 5 MG PO TABS
5.0000 mg | ORAL_TABLET | Freq: Every evening | ORAL | 2 refills | Status: AC
Start: 2022-12-01 — End: ?

## 2022-12-01 NOTE — Progress Notes (Signed)
Chief Complaint  Patient presents with   Annual Exam    Check ears today     Well Woman Megan Gonzalez is here for a complete physical.   Her last physical was >1 year ago.  Current diet: in general, diet is fair. Current exercise: pilates. Weight is stable and she denies fatigue out of ordinary. Seatbelt? Yes Advanced directive? No  Health Maintenance Pap/HPV- Yes Mammogram- No Colon cancer screening-Yes Shingrix- No Tetanus- Yes Hep C screening- Yes HIV screening- Yes  Past Medical History:  Diagnosis Date   Anemia    Anxiety    Arthritis    chronic low back pain   Cancer (HCC)    cervical cancer; no more uterus   Depression    Fecal incontinence    Headache(784.0)    Numbness    Overactive bladder    Sleep apnea    CPAP-non compliant   Tremor      Past Surgical History:  Procedure Laterality Date   ABDOMINAL HYSTERECTOMY     APPENDECTOMY  08/25/2017   bladder neurostimulator     COLONOSCOPY  1999   DILATION AND CURETTAGE OF UTERUS     FOOT SURGERY  2006   left   INCISION AND DRAINAGE ABSCESS N/A 11/19/2013   Procedure: POSTERIOR CULPOTOMY (INCISION AND DRAINAGE OF POST OPERATIVE CUFF HEMATOMA);  Surgeon: Jonnie Kind, MD;  Location: [facility];  Service: Gynecology;  Laterality: N/A;   LAPAROSCOPIC APPENDECTOMY N/A 08/26/2017   Procedure: APPENDECTOMY LAPAROSCOPIC;  Surgeon: Jackolyn Confer, MD;  Location: [facility];  Service: General;  Laterality: N/A;   PUBOVAGINAL SLING  03/24/2012   Procedure: Gaynelle Arabian;  Surgeon: Ailene Rud, MD;  Location: [facility];  Service: Urology;  Laterality: N/A;  lynx with Chemical engineer    REMOVAL OF URINARY SLING     SVD     x 3   TONSILLECTOMY  as child   URETERAL EXPLORATION  5./30/13   VAGINAL HYSTERECTOMY N/A 1/2/015   [employer], Dr Linus Mako and Dr Leory Plowman, with release of Pubovag sling    Medications  Current Outpatient Medications on File Prior to Visit  Medication Sig Dispense Refill    albuterol (VENTOLIN HFA) 108 (90 Base) MCG/ACT inhaler Inhale 2 puffs into the lungs every 4 (four) hours as needed for shortness of breath. 6.7 g 2   azelastine (ASTELIN) 0.1 % nasal spray Place 2 sprays into both nostrils 2 (two) times daily. Use in each nostril as directed 30 mL 12   buPROPion (WELLBUTRIN XL) 300 MG 24 hr tablet take '300mg'$  by mouth daily  2   clotrimazole-betamethasone (LOTRISONE) cream Apply 1 application topically 2 (two) times daily. 30 g 0   doxycycline (VIBRA-TABS) 100 MG tablet Take 1 tablet (100 mg total) by mouth 2 (two) times daily. 14 tablet 0   estradiol (VIVELLE-DOT) 0.025 MG/24HR Place 1 patch onto the skin 2 (two) times a week.     fluticasone (FLOVENT HFA) 110 MCG/ACT inhaler Inhale 2 puffs into the lungs in the morning and at bedtime. Rinse mouth out after use. 1 each 1   gabapentin (NEURONTIN) 300 MG capsule Take 1 capsule (300 mg total) by mouth 3 (three) times daily. 270 capsule 2   hydrOXYzine (VISTARIL) 25 MG capsule Take 1 capsule (25 mg total) by mouth every 8 (eight) hours as needed for itching. 30 capsule 0   methocarbamol (ROBAXIN) 500 MG tablet Take 1 tablet (500 mg total) by mouth 2 (two) times daily. 30 tablet  0   prazosin (MINIPRESS) 2 MG capsule Take 2 mg by mouth at bedtime.     rizatriptan (MAXALT-MLT) 5 MG disintegrating tablet DISSOLVE ONE TABLET BY MOUTH AS NEEDED. MAY REPEAT IN 2 HOURS IF NEEDED. 10 tablet 11    Allergies Allergies  Allergen Reactions   Codeine Nausea And Vomiting   Penicillins Other (See Comments)    Unknown- childhood reaction    Review of Systems: Constitutional:  no unexpected weight changes Eye:  no recent significant change in vision Ear/Nose/Mouth/Throat:  Ears:  no recent change in hearing Nose/Mouth/Throat:  no complaints of nasal congestion, no sore throat Cardiovascular: no chest pain Respiratory:  no shortness of breath Gastrointestinal:  no abdominal pain, no change in bowel habits GU:  Female:  negative for dysuria or pelvic pain Musculoskeletal/Extremities:  no new pain of the joints Integumentary (Skin/Breast):  no abnormal skin lesions reported Neurologic:  no headaches Endocrine:  denies fatigue  Exam BP 108/68 (BP Location: Left Arm, Patient Position: Sitting, Cuff Size: Normal)   Pulse 92   Temp 98.2 F (36.8 C) (Oral)   Ht '5\' 2"'$  (1.575 m)   Wt 164 lb 2 oz (74.4 kg)   LMP 04/24/2012   SpO2 99%   BMI 30.02 kg/m  General:  well developed, well nourished, in no apparent distress Skin:  no significant moles, warts, or growths Head:  no masses, lesions, or tenderness Eyes:  pupils equal and round, sclera anicteric without injection Ears:  canals without lesions, TMs shiny without retraction, no obvious effusion, no erythema Nose:  nares patent, mucosa normal, and no drainage  Throat/Pharynx:  lips and gingiva without lesion; tongue and uvula midline; non-inflamed pharynx; no exudates or postnasal drainage Neck: neck supple without adenopathy, thyromegaly, or masses Lungs:  clear to auscultation, breath sounds equal bilaterally, no respiratory distress Cardio:  regular rate and rhythm, no LE edema Abdomen:  abdomen soft, nontender; bowel sounds normal; no masses or organomegaly Genital: Defer to GYN Musculoskeletal:  symmetrical muscle groups noted without atrophy or deformity Extremities:  no clubbing, cyanosis, or edema, no deformities, no skin discoloration Neuro:  gait normal; deep tendon reflexes normal and symmetric Psych: well oriented with normal range of affect and appropriate judgment/insight  Assessment and Plan  Well adult exam - Plan: CBC, Comprehensive metabolic panel, Lipid panel  Seasonal allergies - Plan: fluticasone (FLONASE) 50 MCG/ACT nasal spray  Bug bite, initial encounter - Plan: levocetirizine (XYZAL) 5 MG tablet  Gastroesophageal reflux disease, unspecified whether esophagitis present - Plan: pantoprazole (PROTONIX) 40 MG tablet   Well 52  y.o. female. Counseled on diet and exercise. Advanced directive form provided today.  Shingrix rec'd.  # to contact imaging for mammogram provided today.  Other orders as above. Follow up in 6 mo or prn. The patient voiced understanding and agreement to the plan.  [place], DO 12/01/22 2:06 PM

## 2022-12-01 NOTE — Patient Instructions (Addendum)
Give [place] 2-3 business days to get the results of your labs back.   Please contact [phone] for your mammogram.   Keep the diet clean and stay active.  The Shingrix vaccine (for shingles) is a 2 shot series spaced 2-6 months apart. It can make people feel low energy, achy and almost like they have the flu for 48 hours after injection. 1/5 people can have nausea and/or vomiting. Please plan accordingly when deciding on when to get this shot. Call our office for a nurse visit appointment to get this. The second shot of the series is less severe regarding the side effects, but it still lasts 48 hours.   Please get me a copy of your advanced directive form at your convenience.   Let us know if you need anything.

## 2022-12-02 LAB — LIPID PANEL
Cholesterol: 187 mg/dL (ref 0–200)
HDL: 41.7 mg/dL (ref >39.00)
LDL Cholesterol: 116 mg/dL — ABNORMAL HIGH (ref 0–99)
NonHDL: 145.12
Total CHOL/HDL Ratio: 4
Triglycerides: 146 mg/dL (ref 0.0–149.0)
VLDL: 29.2 mg/dL (ref 0.0–40.0)

## 2022-12-02 LAB — CBC
HCT: 40.1 % (ref 36.0–46.0)
Hemoglobin: 13.2 g/dL (ref 12.0–15.0)
MCHC: 33 g/dL (ref 30.0–36.0)
MCV: 83.8 fl (ref 78.0–100.0)
Platelets: 290 10*3/uL (ref 150.0–400.0)
RBC: 4.79 Mil/uL (ref 3.87–5.11)
RDW: 13.5 % (ref 11.5–15.5)
WBC: 5.8 10*3/uL (ref 4.0–10.5)

## 2022-12-02 LAB — COMPREHENSIVE METABOLIC PANEL
ALT: 17 U/L (ref 0–35)
AST: 16 U/L (ref 0–37)
Albumin: 4.8 g/dL (ref 3.5–5.2)
Alkaline Phosphatase: 74 U/L (ref 39–117)
BUN: 16 mg/dL (ref 6–23)
CO2: 31 mEq/L (ref 19–32)
Calcium: 9.8 mg/dL (ref 8.4–10.5)
Chloride: 101 mEq/L (ref 96–112)
Creatinine, Ser: 1.2 mg/dL (ref 0.40–1.20)
GFR: 52.66 mL/min — ABNORMAL LOW (ref >60.00)
Glucose, Bld: 74 mg/dL (ref 70–99)
Potassium: 4.2 mEq/L (ref 3.5–5.1)
Sodium: 141 mEq/L (ref 135–145)
Total Bilirubin: 0.3 mg/dL (ref 0.2–1.2)
Total Protein: 6.8 g/dL (ref 6.0–8.3)

## 2023-01-14 ENCOUNTER — Telehealth: Payer: Self-pay | Admitting: Family Medicine

## 2023-01-14 NOTE — Telephone Encounter (Signed)
Copied from [place] [id number]. Topic: Medicare AWV >> Jan 14, 2023 10:05 AM Devoria Glassing wrote: Reason for CRM: Called patient to schedule Medicare Annual Wellness Visit (AWV). Left message for patient to call back and schedule Medicare Annual Wellness Visit (AWV).  Last date of AWV: NONE  Please schedule an appointment at any time with Beatris Ship, CMA after 01/25/2023 on AWV schedule  .  If any questions, please contact me.  Thank you ,  Sherol Dade; [place] Direct Dial: [phone]

## 2023-08-11 ENCOUNTER — Telehealth: Payer: Self-pay | Admitting: Family Medicine

## 2023-08-11 NOTE — Telephone Encounter (Signed)
Initial Comment Caller states she is due for her annual check up but has been dealing with dizziness when she looks down. Feels like she will pass out but hasn't. Thinks she may need blood work. Translation No Disp. Time Lamount Cohen Time) Disposition Final User 08/11/2023 1:26:21 PM Attempt made - message left Carmon, RN, Angelique Blonder 08/11/2023 1:33:02 PM Attempt made - message left Carmon, RN, Angelique Blonder 08/11/2023 1:45:25 PM FINAL ATTEMPT MADE - message left Yes Annye English, RN, Angelique Blonder Final Disposition 08/11/2023 1:45:25 PM FINAL ATTEMPT MADE - message left Yes Carmon, RN, Angelique Blonder

## 2023-08-11 NOTE — Telephone Encounter (Signed)
Called left message left message to call back

## 2023-08-11 NOTE — Telephone Encounter (Signed)
Pt states she is feeling dizzy with some constipation and wanted to see pcp tomorrow or Friday. Transferred to triage for nurse eval.

## 2023-08-13 NOTE — Telephone Encounter (Signed)
Called left message to call back 

## 2023-08-16 NOTE — Telephone Encounter (Signed)
Called the patient and got her this time on the phone Schedule appointment 08/17/23 at [facility]

## 2023-08-16 NOTE — Telephone Encounter (Signed)
Unable to get the patient on the phone Patient seeing GYN on 10/14/23.

## 2023-08-16 NOTE — Telephone Encounter (Signed)
Would like her to be evaluated at convenience if we are able to get a hold of her. Ty.

## 2023-08-16 NOTE — Telephone Encounter (Signed)
Called left message to call back and schedule with PCP if still having problems. Unable to get the patient.  Will forward  note to PCP

## 2023-08-17 ENCOUNTER — Encounter: Payer: Self-pay | Admitting: Family Medicine

## 2023-08-17 ENCOUNTER — Ambulatory Visit (INDEPENDENT_AMBULATORY_CARE_PROVIDER_SITE_OTHER): Payer: 59 | Admitting: Family Medicine

## 2023-08-17 VITALS — BP 110/70 | HR 64 | Temp 98.4°F | Ht 62.0 in | Wt 173.2 lb

## 2023-08-17 DIAGNOSIS — R42 Dizziness and giddiness: Secondary | ICD-10-CM | POA: Diagnosis not present

## 2023-08-17 DIAGNOSIS — J302 Other seasonal allergic rhinitis: Secondary | ICD-10-CM

## 2023-08-17 DIAGNOSIS — K6289 Other specified diseases of anus and rectum: Secondary | ICD-10-CM

## 2023-08-17 LAB — COMPREHENSIVE METABOLIC PANEL
ALT: 18 U/L (ref 0–35)
AST: 16 U/L (ref 0–37)
Albumin: 4.5 g/dL (ref 3.5–5.2)
Alkaline Phosphatase: 86 U/L (ref 39–117)
BUN: 13 mg/dL (ref 6–23)
CO2: 31 meq/L (ref 19–32)
Calcium: 9.6 mg/dL (ref 8.4–10.5)
Chloride: 104 meq/L (ref 96–112)
Creatinine, Ser: 1.23 mg/dL — ABNORMAL HIGH (ref 0.40–1.20)
GFR: 50.87 mL/min — ABNORMAL LOW (ref >60.00)
Glucose, Bld: 86 mg/dL (ref 70–99)
Potassium: 4.4 meq/L (ref 3.5–5.1)
Sodium: 144 meq/L (ref 135–145)
Total Bilirubin: 0.4 mg/dL (ref 0.2–1.2)
Total Protein: 6.6 g/dL (ref 6.0–8.3)

## 2023-08-17 LAB — CBC
HCT: 42.2 % (ref 36.0–46.0)
Hemoglobin: 13.6 g/dL (ref 12.0–15.0)
MCHC: 32.2 g/dL (ref 30.0–36.0)
MCV: 83.2 fL (ref 78.0–100.0)
Platelets: 279 10*3/uL (ref 150.0–400.0)
RBC: 5.07 Mil/uL (ref 3.87–5.11)
RDW: 13.9 % (ref 11.5–15.5)
WBC: 4.4 10*3/uL (ref 4.0–10.5)

## 2023-08-17 MED ORDER — CLOTRIMAZOLE-BETAMETHASONE 1-0.05 % EX CREA: 1.0000 | TOPICAL_CREAM | Freq: Two times a day (BID) | CUTANEOUS | 0 refills | Status: DC

## 2023-08-17 MED ORDER — AZELASTINE HCL 0.1 % NA SOLN: 2.0000 | Freq: Two times a day (BID) | NASAL | 12 refills | Status: DC

## 2023-08-17 MED ORDER — FLUTICASONE PROPIONATE 50 MCG/ACT NA SUSP
2.0000 | Freq: Every day | NASAL | 2 refills | Status: AC
Start: 2023-08-17 — End: ?

## 2023-08-17 NOTE — Patient Instructions (Addendum)
Stay hydrated. Try to drink 55-60 oz of water daily outside of exercise.  Give [place] 2-3 business days to get the results of your labs back.   Consider using YouTube if the instructions are not making sense. Send me a message in 2 days if not improving with the spinning sensation.   Let us know if you need anything.

## 2023-08-17 NOTE — Progress Notes (Signed)
Chief Complaint  Patient presents with   Dizziness    Light headed     Megan Gonzalez is 52 y.o. pt here for dizziness.  Duration: 3 weeks, comes and goes Feels lightheaded and sometimes feels that she is moving forward but is stationary.  Associated runny eyes and ringing in the ears.  Pass out? No; feels like she is going to pass out x3 when she stands up.  Spinning? Yes; can last a few seconds, goes away when she stops moving.  Sometimes will last multiple days.  Recent illness/fever? No; she does have allergies Headache? No Neurologic signs? No Change in PO intake? No Palpitations? No  Past Medical History:  Diagnosis Date   Anemia    Anxiety    Arthritis    chronic low back pain   Cancer (HCC)    cervical cancer; no more uterus   Depression    Fecal incontinence    Headache(784.0)    Numbness    Overactive bladder    Sleep apnea    CPAP-non compliant   Tremor     Family History  Problem Relation Age of Onset   Cancer Mother 42       Breast cancer   Thyroid disease Mother    Hypotension Mother    Other Father        unsure of history   Anesthesia problems Neg Hx     Allergies as of 08/17/2023       Reactions   Codeine Nausea And Vomiting   Penicillins Other (See Comments)   Unknown- childhood reaction        Medication List        Accurate as of August 17, 2023  7:30 AM. If you have any questions, ask your nurse or doctor.          STOP taking these medications    doxycycline 100 MG tablet Commonly known as: VIBRA-TABS Stopped by: Sharlene Dory   Flovent HFA 110 MCG/ACT inhaler Generic drug: fluticasone Stopped by: Jilda Roche Miakoda Mcmillion   hydrOXYzine 25 MG capsule Commonly known as: VISTARIL Stopped by: Jilda Roche Gerard Bonus   methocarbamol 500 MG tablet Commonly known as: ROBAXIN Stopped by: Sharlene Dory       TAKE these medications    albuterol 108 (90 Base) MCG/ACT inhaler Commonly known  as: VENTOLIN HFA Inhale 2 puffs into the lungs every 4 (four) hours as needed for shortness of breath.   azelastine 0.1 % nasal spray Commonly known as: ASTELIN Place 2 sprays into both nostrils 2 (two) times daily. Use in each nostril as directed   buPROPion 300 MG 24 hr tablet Commonly known as: WELLBUTRIN XL take 300mg  by mouth daily   clotrimazole-betamethasone cream Commonly known as: Lotrisone Apply 1 Application topically 2 (two) times daily.   estradiol 0.025 MG/24HR Commonly known as: VIVELLE-DOT Place 1 patch onto the skin 2 (two) times a week.   fluticasone 50 MCG/ACT nasal spray Commonly known as: FLONASE Place 2 sprays into both nostrils daily.   gabapentin 300 MG capsule Commonly known as: NEURONTIN Take 1 capsule (300 mg total) by mouth 3 (three) times daily.   levocetirizine 5 MG tablet Commonly known as: XYZAL Take 1 tablet (5 mg total) by mouth every evening.   pantoprazole 40 MG tablet Commonly known as: PROTONIX TAKE 1 TABLET(40 MG) BY MOUTH DAILY   prazosin 2 MG capsule Commonly known as: MINIPRESS Take 2 mg by mouth at bedtime.   rizatriptan 5  MG disintegrating tablet Commonly known as: MAXALT-MLT DISSOLVE ONE TABLET BY MOUTH AS NEEDED. MAY REPEAT IN 2 HOURS IF NEEDED.        BP 110/70 (BP Location: Left Arm, Patient Position: Sitting, Cuff Size: Normal)   Pulse 64   Temp 98.4 F (36.9 C) (Oral)   Ht 5\' 2"  (1.575 m)   Wt 173 lb 4 oz (78.6 kg)   LMP 04/24/2012   SpO2 99%   BMI 31.69 kg/m  General: Awake, alert, appears stated age Eyes: PERRLA, EOMi Ears: Patent, TM's neg b/l Heart: RRR, no murmurs, no carotid bruits Lungs: CTAB, no accessory muscle use Neuro: No cerebellar signs, patellar reflex 2/4 b/l wo clonus, calcaneal reflex 0/4 b/l wo clonus, biceps reflex 1/4 b/l wo clonus; 5/5 strength throughout, gait normal, Dix-Hall-Pike + on R, spared her for the left. Psych: Age appropriate judgment and insight, normal mood and  affect  Vertigo  Lightheaded - Plan: CBC, Comprehensive metabolic panel  Seasonal allergies - Plan: azelastine (ASTELIN) 0.1 % nasal spray, fluticasone (FLONASE) 50 MCG/ACT nasal spray  Anal irritation - Plan: clotrimazole-betamethasone (LOTRISONE) cream  Acute issue.  Epley maneuver provided.  YouTube instructions do not make sense.  She will message me in a few days if not improved and we will refer her to the vestibular rehab team. Could be related to #1.  Need to improve hydration.  Will check basic labs today.  If still not improved, would consider cardiology evaluation. This could also be playing a role.  Will refill Astelin and Flonase which she has not been as diligent with. Refill as above given her incontinence issues returning. F/u as originally scheduled. Pt voiced understanding and agreement to the plan.  I spent 32 minutes with the patient discussing the above plans in addition to reviewing her chart on the same day of the visit.  Jilda Roche [place], DO 08/17/23 7:30 AM

## 2023-08-25 ENCOUNTER — Telehealth: Payer: Self-pay

## 2023-08-25 NOTE — Telephone Encounter (Signed)
Called pt lvm asking her if she had MM DIGITAL SCREENING BILATERAL done yet before expire.  Ask her to let us know.

## 2023-08-26 ENCOUNTER — Ambulatory Visit (HOSPITAL_BASED_OUTPATIENT_CLINIC_OR_DEPARTMENT_OTHER)
Admission: RE | Admit: 2023-08-26 | Discharge: 2023-08-26 | Disposition: A | Discharge status: Home / Self Care | Payer: 59 | Source: Ambulatory Visit | Attending: Family Medicine | Admitting: Family Medicine

## 2023-08-26 ENCOUNTER — Encounter (HOSPITAL_BASED_OUTPATIENT_CLINIC_OR_DEPARTMENT_OTHER): Payer: Self-pay

## 2023-08-26 DIAGNOSIS — Z1231 Encounter for screening mammogram for malignant neoplasm of breast: Secondary | ICD-10-CM | POA: Diagnosis present

## 2023-09-09 ENCOUNTER — Telehealth: Payer: 59 | Admitting: Physician Assistant

## 2023-09-09 DIAGNOSIS — J0110 Acute frontal sinusitis, unspecified: Secondary | ICD-10-CM | POA: Diagnosis not present

## 2023-09-09 MED ORDER — DOXYCYCLINE HYCLATE 100 MG PO TABS: 100.0000 mg | ORAL_TABLET | Freq: Two times a day (BID) | ORAL | 0 refills | Status: AC

## 2023-09-09 NOTE — Progress Notes (Signed)
MyChart Video Visit    Virtual Visit via Video Note   This format is felt to be most appropriate for this patient at this time. Physical exam was limited by quality of the video and audio technology used for the visit.   Patient location: home Provider location: [email or web address]  I discussed the limitations of evaluation and management by telemedicine and the availability of in person appointments. The patient expressed understanding and agreed to proceed.  Patient: Megan Gonzalez   DOB: 29-Aug-1971   52 y.o. Female  MRN: [id number] Visit Date: 09/09/2023  Today's healthcare provider: Alfredia Ferguson, PA-C   Chief Complaint  Patient presents with   Sore Throat    Symptoms present for week. Does have sweats through out the day and night.  OTC- Pseudafed and tylenol(sinus)    Subjective    Sore Throat  Associated symptoms include congestion, coughing and headaches. Pertinent negatives include no abdominal pain or shortness of breath.   Pt reports nasal congestion, sinus pain, sore throat, cough w/ green mucous x 1 week. Denies fevers, but having night sweats and chills. Denies wheezing, sob.  She has tried sudafed and tylenol sinus over the counter.  Medications: Outpatient Medications Prior to Visit  Medication Sig   albuterol (VENTOLIN HFA) 108 (90 Base) MCG/ACT inhaler Inhale 2 puffs into the lungs every 4 (four) hours as needed for shortness of breath.   azelastine (ASTELIN) 0.1 % nasal spray Place 2 sprays into both nostrils 2 (two) times daily. Use in each nostril as directed   buPROPion (WELLBUTRIN XL) 300 MG 24 hr tablet take 300mg  by mouth daily   clotrimazole-betamethasone (LOTRISONE) cream Apply 1 Application topically 2 (two) times daily.   estradiol (VIVELLE-DOT) 0.025 MG/24HR Place 1 patch onto the skin 2 (two) times a week.   fluticasone (FLONASE) 50 MCG/ACT nasal spray Place 2 sprays into both nostrils daily.   gabapentin (NEURONTIN) 300 MG capsule Take 1 capsule  (300 mg total) by mouth 3 (three) times daily.   levocetirizine (XYZAL) 5 MG tablet Take 1 tablet (5 mg total) by mouth every evening.   pantoprazole (PROTONIX) 40 MG tablet TAKE 1 TABLET(40 MG) BY MOUTH DAILY   prazosin (MINIPRESS) 2 MG capsule Take 2 mg by mouth at bedtime.   rizatriptan (MAXALT-MLT) 5 MG disintegrating tablet DISSOLVE ONE TABLET BY MOUTH AS NEEDED. MAY REPEAT IN 2 HOURS IF NEEDED.   No facility-administered medications prior to visit.    Review of Systems  Constitutional:  Positive for fatigue. Negative for fever.  HENT:  Positive for congestion, postnasal drip, rhinorrhea, sinus pressure, sinus pain and sore throat.   Respiratory:  Positive for cough. Negative for shortness of breath and wheezing.   Cardiovascular:  Negative for chest pain and leg swelling.  Gastrointestinal:  Negative for abdominal pain.  Neurological:  Positive for headaches. Negative for dizziness.        Objective    LMP 04/24/2012       Physical Exam     Assessment & Plan     1. Acute non-recurrent frontal sinusitis Rec rest, hydration, otc antihistamines, saline nasal sprays.  Rx doxy bid x 7 days. Pt has PCN allergy.   - doxycycline (VIBRA-TABS) 100 MG tablet; Take 1 tablet (100 mg total) by mouth 2 (two) times daily for 7 days.  Dispense: 14 tablet; Refill: 0   Return if symptoms worsen or fail to improve.     I discussed the assessment and treatment plan with the patient.  The patient was provided an opportunity to ask questions and all were answered. The patient agreed with the plan and demonstrated an understanding of the instructions.   The patient was advised to call back or seek an in-person evaluation if the symptoms worsen or if the condition fails to improve as anticipated.  I provided 7 minutes of non-face-to-face time during this encounter.  Alfredia Ferguson, PA-C [facility] at [facility] [phone] (phone) [phone]  (fax)  [facility]

## 2023-09-22 ENCOUNTER — Ambulatory Visit: Payer: Medicare Other | Admitting: Family Medicine

## 2023-09-22 ENCOUNTER — Other Ambulatory Visit: Payer: Self-pay

## 2023-09-22 DIAGNOSIS — K219 Gastro-esophageal reflux disease without esophagitis: Secondary | ICD-10-CM

## 2023-09-22 MED ORDER — PANTOPRAZOLE SODIUM 40 MG PO TBEC: DELAYED_RELEASE_TABLET | ORAL | 2 refills | Status: DC

## 2024-01-07 ENCOUNTER — Other Ambulatory Visit (HOSPITAL_BASED_OUTPATIENT_CLINIC_OR_DEPARTMENT_OTHER): Payer: Self-pay

## 2024-01-07 ENCOUNTER — Encounter: Payer: Self-pay | Admitting: Family Medicine

## 2024-01-07 ENCOUNTER — Ambulatory Visit: Admitting: Family Medicine

## 2024-01-07 VITALS — BP 134/78 | HR 78 | Temp 98.0°F | Resp 18 | Ht 62.0 in | Wt 172.6 lb

## 2024-01-07 DIAGNOSIS — J0140 Acute pansinusitis, unspecified: Secondary | ICD-10-CM | POA: Diagnosis not present

## 2024-01-07 DIAGNOSIS — R059 Cough, unspecified: Secondary | ICD-10-CM

## 2024-01-07 LAB — POCT INFLUENZA A/B
Influenza A, POC: NEGATIVE
Influenza B, POC: NEGATIVE

## 2024-01-07 LAB — POC COVID19 BINAXNOW: SARS Coronavirus 2 Ag: NEGATIVE

## 2024-01-07 MED ORDER — FLUCONAZOLE 150 MG PO TABS
ORAL_TABLET | ORAL | 0 refills | Status: DC
Start: 2024-01-07 — End: 2024-05-05
  Filled 2024-01-07: qty 2, 4d supply, fill #0

## 2024-01-07 MED ORDER — METHYLPREDNISOLONE ACETATE 80 MG/ML IJ SUSP
80.0000 mg | Freq: Once | INTRAMUSCULAR | Status: AC
  Administered 2024-01-07: 80 mg via INTRAMUSCULAR

## 2024-01-07 MED ORDER — DOXYCYCLINE HYCLATE 100 MG PO TABS
100.0000 mg | ORAL_TABLET | Freq: Two times a day (BID) | ORAL | 0 refills | Status: AC
  Filled 2024-01-07: qty 14, 7d supply, fill #0

## 2024-01-07 NOTE — Patient Instructions (Addendum)
 Continue to push fluids, practice good hand hygiene, and cover your mouth if you cough.  If you start having fevers, shaking or shortness of breath, seek immediate care.  OK to take Tylenol 1000 mg (2 extra strength tabs) or 975 mg (3 regular strength tabs) every 6 hours as needed.  If not obviously better by Sunday, take the antibiotic.   Let us know if you need anything.

## 2024-01-07 NOTE — Progress Notes (Signed)
 Chief Complaint  Patient presents with   Cough    3-4 weeks    Megan Gonzalez here for URI complaints.  Duration: 3 weeks  Associated symptoms: subjective fever, sinus headache, sinus congestion, sinus pain, myalgia, and coughing Denies: itchy watery eyes, ear pain, ear drainage, sore throat, wheezing, shortness of breath, and fevers Treatment to date: Tylenol sinus, INCS, Astelin nasal spray, Vit C Sick contacts: Yes- spouse  Past Medical History:  Diagnosis Date   Anemia    Anxiety    Arthritis    chronic low back pain   Cancer (HCC)    cervical cancer; no more uterus   Depression    Fecal incontinence    Headache(784.0)    Numbness    Overactive bladder    Sleep apnea    CPAP-non compliant   Tremor     Objective BP 134/78   Pulse 78   Temp 98 F (36.7 C)   Resp 18   Ht 5\' 2"  (1.575 m)   Wt 172 lb 9.6 oz (78.3 kg)   LMP 04/24/2012   SpO2 100%   BMI 31.57 kg/m  General: Awake, alert, appears stated age HEENT: AT, [name], ears patent b/l and TM's neg, nares patent w/o discharge, pharynx pink and without exudates, MMM, +ttp over max and frontal sinuses.  Neck: No masses or asymmetry Heart: RRR Lungs: CTAB, no accessory muscle use Psych: Age appropriate judgment and insight, normal mood and affect  Acute pansinusitis, recurrence not specified - Plan: doxycycline (VIBRA-TABS) 100 MG tablet, fluconazole (DIFLUCAN) 150 MG tablet  Cough, unspecified type - Plan: POC COVID-19, POCT Influenza A/B  Covid and flu testing neg. Depomedrol 80 mg IM today. If no better in 2 d, will take 7 d course of doxy. Continue to push fluids, practice good hand hygiene, cover mouth when coughing. F/u prn. If starting to experience fevers, shaking, or shortness of breath, seek immediate care. Pt voiced understanding and agreement to the plan.  Jilda Roche [place], DO 01/07/24 7:51 AM

## 2024-01-07 NOTE — Addendum Note (Signed)
 Addended by: Maximino Sarin on: 01/07/2024 07:53 AM   Modules accepted: Orders

## 2024-01-21 ENCOUNTER — Other Ambulatory Visit (HOSPITAL_BASED_OUTPATIENT_CLINIC_OR_DEPARTMENT_OTHER): Payer: Self-pay

## 2024-01-21 ENCOUNTER — Other Ambulatory Visit: Payer: Self-pay

## 2024-01-21 MED ORDER — METHYLPHENIDATE HCL 10 MG PO TABS
15.0000 mg | ORAL_TABLET | Freq: Every morning | ORAL | 0 refills | Status: AC
  Filled 2024-01-21 – 2024-01-28 (×3): qty 45, 30d supply, fill #0

## 2024-01-21 MED ORDER — METHYLPHENIDATE HCL 10 MG PO TABS
15.0000 mg | ORAL_TABLET | Freq: Every morning | ORAL | 0 refills | Status: AC
  Filled 2024-03-17: qty 45, 30d supply, fill #0

## 2024-01-21 MED ORDER — HYDROXYZINE HCL 10 MG PO TABS
10.0000 mg | ORAL_TABLET | Freq: Two times a day (BID) | ORAL | 0 refills | Status: AC | PRN
  Filled 2024-01-21: qty 60, 30d supply, fill #0

## 2024-01-21 MED ORDER — METHYLPHENIDATE HCL 10 MG PO TABS
15.0000 mg | ORAL_TABLET | Freq: Every morning | ORAL | 0 refills | Status: AC
  Filled 2024-07-03: qty 45, 30d supply, fill #0

## 2024-01-21 MED ORDER — BUPROPION HCL ER (XL) 300 MG PO TB24
300.0000 mg | ORAL_TABLET | Freq: Every day | ORAL | 0 refills | Status: AC
  Filled 2024-01-21: qty 90, 90d supply, fill #0
  Filled 2024-01-25: qty 30, 30d supply, fill #0
  Filled 2024-02-22: qty 30, 30d supply, fill #1
  Filled 2024-03-29: qty 30, 30d supply, fill #2

## 2024-01-21 MED ORDER — PRAZOSIN HCL 2 MG PO CAPS
2.0000 mg | ORAL_CAPSULE | Freq: Every day | ORAL | 0 refills | Status: AC
  Filled 2024-01-21: qty 30, 30d supply, fill #0
  Filled 2024-08-08: qty 30, 30d supply, fill #1

## 2024-01-25 ENCOUNTER — Other Ambulatory Visit (HOSPITAL_BASED_OUTPATIENT_CLINIC_OR_DEPARTMENT_OTHER): Payer: Self-pay

## 2024-01-25 ENCOUNTER — Other Ambulatory Visit: Payer: Self-pay

## 2024-01-28 ENCOUNTER — Other Ambulatory Visit: Payer: Self-pay

## 2024-01-28 ENCOUNTER — Other Ambulatory Visit (HOSPITAL_BASED_OUTPATIENT_CLINIC_OR_DEPARTMENT_OTHER): Payer: Self-pay

## 2024-02-25 ENCOUNTER — Other Ambulatory Visit (HOSPITAL_BASED_OUTPATIENT_CLINIC_OR_DEPARTMENT_OTHER): Payer: Self-pay

## 2024-02-25 ENCOUNTER — Other Ambulatory Visit: Payer: Self-pay | Admitting: Family Medicine

## 2024-02-25 DIAGNOSIS — K219 Gastro-esophageal reflux disease without esophagitis: Secondary | ICD-10-CM

## 2024-02-25 DIAGNOSIS — J302 Other seasonal allergic rhinitis: Secondary | ICD-10-CM

## 2024-02-25 MED ORDER — PREMARIN 0.625 MG/GM VA CREA
0.5000 g | TOPICAL_CREAM | VAGINAL | 4 refills | Status: AC
Start: 2024-02-28 — End: ?
  Filled 2024-02-25: qty 30, 30d supply, fill #0

## 2024-02-25 NOTE — Telephone Encounter (Signed)
 Last Fill: Pantoprazole : 09/22/23     Astelin : 08/17/23  Last OV: 01/07/24 Next OV: None Scheduled  Routing to provider for review/authorization.

## 2024-02-25 NOTE — Telephone Encounter (Signed)
 Copied from CRM [id number]. Topic: Clinical - Medication Refill >> Feb 25, 2024  3:45 PM Jenice Mitts wrote: Most Recent Primary Care Visit:  Provider: Jobe Mulder  Department: [facility]  Visit Type: ACUTE  Date: 01/07/2024  Medication: pantoprazole  (PROTONIX ) 40 MG tablet azelastine  (ASTELIN ) 0.1 % nasal spray  Has the patient contacted their pharmacy? Yes (Agent: If no, request that the patient contact the pharmacy for the refill. If patient does not wish to contact the pharmacy document the reason why and proceed with request.) (Agent: If yes, when and what did the pharmacy advise?)  Is this the correct pharmacy for this prescription? Yes If no, delete pharmacy and type the correct one.  This is the patient's preferred pharmacy:  [id number] - [place], [place] - [address] [place] [address] Phone: [phone] Fax: [phone]   Has the prescription been filled recently? No  Is the patient out of the medication? Yes  Has the patient been seen for an appointment in the last year OR does the patient have an upcoming appointment? Yes  Can we respond through MyChart? No  Agent: Please be advised that Rx refills may take up to 3 business days. We ask that you follow-up with your pharmacy.

## 2024-02-28 MED ORDER — AZELASTINE HCL 0.1 % NA SOLN: 2.0000 | Freq: Two times a day (BID) | NASAL | 12 refills | Status: AC

## 2024-02-28 MED ORDER — PANTOPRAZOLE SODIUM 40 MG PO TBEC: DELAYED_RELEASE_TABLET | ORAL | 2 refills | Status: DC

## 2024-03-17 ENCOUNTER — Other Ambulatory Visit (HOSPITAL_BASED_OUTPATIENT_CLINIC_OR_DEPARTMENT_OTHER): Payer: Self-pay

## 2024-03-29 ENCOUNTER — Other Ambulatory Visit (HOSPITAL_BASED_OUTPATIENT_CLINIC_OR_DEPARTMENT_OTHER): Payer: Self-pay

## 2024-04-29 ENCOUNTER — Other Ambulatory Visit (HOSPITAL_BASED_OUTPATIENT_CLINIC_OR_DEPARTMENT_OTHER): Payer: Self-pay

## 2024-05-01 ENCOUNTER — Other Ambulatory Visit (HOSPITAL_BASED_OUTPATIENT_CLINIC_OR_DEPARTMENT_OTHER): Payer: Self-pay

## 2024-05-01 MED ORDER — METHYLPHENIDATE HCL 10 MG PO TABS
15.0000 mg | ORAL_TABLET | Freq: Every morning | ORAL | 0 refills | Status: AC
  Filled 2024-09-04: qty 45, 30d supply, fill #0

## 2024-05-01 MED ORDER — BUPROPION HCL ER (XL) 300 MG PO TB24
300.0000 mg | ORAL_TABLET | Freq: Every day | ORAL | 0 refills | Status: DC
  Filled 2024-05-01: qty 30, 30d supply, fill #0
  Filled 2024-05-31: qty 30, 30d supply, fill #1
  Filled 2024-06-26: qty 30, 30d supply, fill #2

## 2024-05-01 MED ORDER — METHYLPHENIDATE HCL 10 MG PO TABS
15.0000 mg | ORAL_TABLET | Freq: Every morning | ORAL | 0 refills | Status: AC
  Filled 2024-05-31: qty 45, 30d supply, fill #0

## 2024-05-01 MED ORDER — PRAZOSIN HCL 2 MG PO CAPS
2.0000 mg | ORAL_CAPSULE | Freq: Every day | ORAL | 0 refills | Status: AC
  Filled 2024-05-01: qty 30, 30d supply, fill #0

## 2024-05-01 MED ORDER — METHYLPHENIDATE HCL 10 MG PO TABS
15.0000 mg | ORAL_TABLET | Freq: Every morning | ORAL | 0 refills | Status: AC
  Filled 2024-05-01: qty 45, 30d supply, fill #0

## 2024-05-01 MED ORDER — HYDROXYZINE HCL 10 MG PO TABS
10.0000 mg | ORAL_TABLET | Freq: Two times a day (BID) | ORAL | 0 refills | Status: AC | PRN
  Filled 2024-05-01: qty 60, 30d supply, fill #0

## 2024-05-02 ENCOUNTER — Other Ambulatory Visit (HOSPITAL_BASED_OUTPATIENT_CLINIC_OR_DEPARTMENT_OTHER): Payer: Self-pay

## 2024-05-02 ENCOUNTER — Other Ambulatory Visit: Payer: Self-pay

## 2024-05-05 ENCOUNTER — Encounter: Payer: Self-pay | Admitting: Family Medicine

## 2024-05-05 ENCOUNTER — Ambulatory Visit (INDEPENDENT_AMBULATORY_CARE_PROVIDER_SITE_OTHER): Admitting: Family Medicine

## 2024-05-05 ENCOUNTER — Ambulatory Visit: Payer: Self-pay | Admitting: Family Medicine

## 2024-05-05 VITALS — BP 130/74 | HR 100 | Temp 98.0°F | Resp 16 | Ht 62.0 in | Wt 178.0 lb

## 2024-05-05 DIAGNOSIS — R944 Abnormal results of kidney function studies: Secondary | ICD-10-CM | POA: Diagnosis not present

## 2024-05-05 DIAGNOSIS — R739 Hyperglycemia, unspecified: Secondary | ICD-10-CM

## 2024-05-05 DIAGNOSIS — R599 Enlarged lymph nodes, unspecified: Secondary | ICD-10-CM

## 2024-05-05 DIAGNOSIS — K219 Gastro-esophageal reflux disease without esophagitis: Secondary | ICD-10-CM

## 2024-05-05 DIAGNOSIS — R350 Frequency of micturition: Secondary | ICD-10-CM

## 2024-05-05 DIAGNOSIS — R7303 Prediabetes: Secondary | ICD-10-CM | POA: Insufficient documentation

## 2024-05-05 LAB — URINALYSIS
Bilirubin Urine: NEGATIVE
Hgb urine dipstick: NEGATIVE
Ketones, ur: NEGATIVE
Leukocytes,Ua: NEGATIVE
Nitrite: NEGATIVE
Specific Gravity, Urine: 1.02 (ref 1.000–1.030)
Total Protein, Urine: NEGATIVE
Urine Glucose: NEGATIVE
Urobilinogen, UA: 0.2 (ref 0.0–1.0)
pH: 6 (ref 5.0–8.0)

## 2024-05-05 LAB — MICROALBUMIN / CREATININE URINE RATIO
Creatinine,U: 175.5 mg/dL
Microalb Creat Ratio: UNDETERMINED mg/g (ref 0.0–30.0)
Microalb, Ur: <0.7 mg/dL

## 2024-05-05 LAB — COMPREHENSIVE METABOLIC PANEL WITH GFR
ALT: 17 U/L (ref 0–35)
AST: 17 U/L (ref 0–37)
Albumin: 4.7 g/dL (ref 3.5–5.2)
Alkaline Phosphatase: 78 U/L (ref 39–117)
BUN: 16 mg/dL (ref 6–23)
CO2: 30 meq/L (ref 19–32)
Calcium: 9.8 mg/dL (ref 8.4–10.5)
Chloride: 105 meq/L (ref 96–112)
Creatinine, Ser: 1.37 mg/dL — ABNORMAL HIGH (ref 0.40–1.20)
GFR: 44.47 mL/min — ABNORMAL LOW (ref >60.00)
Glucose, Bld: 92 mg/dL (ref 70–99)
Potassium: 4.4 meq/L (ref 3.5–5.1)
Sodium: 140 meq/L (ref 135–145)
Total Bilirubin: 0.3 mg/dL (ref 0.2–1.2)
Total Protein: 6.7 g/dL (ref 6.0–8.3)

## 2024-05-05 LAB — HEMOGLOBIN A1C: Hgb A1c MFr Bld: 5.9 % (ref 4.6–6.5)

## 2024-05-05 NOTE — Progress Notes (Signed)
 Chief Complaint  Patient presents with   Follow-up    Follow Up    Subjective: Patient is a 53 y.o. female here for f/u.  Went to UC for neck swelling. She was rx'd Clinda. Getting better overall. Still having some irritation in her throat and coughing that comes and goes, worse when she forgets to take her Protonix . She will sometimes have difficulty swallowing, feeling things get stuck in her upper chest if she gulps too much fluid or does not chew thoroughly. Steadily getting worse but still only intermittent. No wt loss.   She had labs done in UC. GFR 55 and glucose 120. She would like to follow up on this. No known hx of DM or prediabetes.   Past Medical History:  Diagnosis Date   Anemia    Anxiety    Arthritis    chronic low back pain   Cancer (HCC)    cervical cancer; no more uterus   Depression    Fecal incontinence    Headache(784.0)    Numbness    Overactive bladder    Sleep apnea    CPAP-non compliant   Tremor     Objective: BP 130/74 (BP Location: Left Arm, Patient Position: Sitting)   Pulse 100   Temp 98 F (36.7 C) (Oral)   Resp 16   Ht 5' 2 (1.575 m)   Wt 178 lb (80.7 kg)   LMP 04/24/2012   SpO2 98%   BMI 32.56 kg/m  General: Awake, appears stated age Heart: RRR, no LE edema Neck: mild edema over submandibular glands b/l, slight ttp b/l Mouth: MMM, no exudate Nose: Patent, no drainage Lungs: CTAB, no rales, wheezes or rhonchi. No accessory muscle use Psych: Age appropriate judgment and insight, normal affect and mood  Assessment and Plan: Hyperglycemia - Plan: Comprehensive metabolic panel with GFR, Hemoglobin A1c  Decreased GFR - Plan: Microalbumin / creatinine urine ratio  Gastroesophageal reflux disease, unspecified whether esophagitis present  Urine frequency - Plan: Urine Culture, Urinalysis  Glands swollen  Ck A1c. Ck urine and recheck GFR. Stay on Protonix  daily. Take twice daily during flares.  Ck UA/cx.  Seems to be improving.  Finish medication. Unsure exact underlying cause.  The patient voiced understanding and agreement to the plan.  I spent 30 min w pt discussing the above plans in addition to reviewing her chart on the same day of the visit.   Megan [place], DO 05/05/24  9:51 AM

## 2024-05-05 NOTE — Patient Instructions (Addendum)
 If you are having reflux symptoms on the pantoprazole , take it twice daily instead of daily until symptoms resolve.   Give us  2-3 business days to get the results of your labs back.   Keep the diet clean and stay active.  Let us  know if you need anything.

## 2024-05-06 LAB — URINE CULTURE
MICRO NUMBER:: 16687891
Result:: NO GROWTH
SPECIMEN QUALITY:: ADEQUATE

## 2024-05-31 ENCOUNTER — Other Ambulatory Visit (HOSPITAL_BASED_OUTPATIENT_CLINIC_OR_DEPARTMENT_OTHER): Payer: Self-pay

## 2024-06-01 ENCOUNTER — Other Ambulatory Visit (HOSPITAL_BASED_OUTPATIENT_CLINIC_OR_DEPARTMENT_OTHER): Payer: Self-pay

## 2024-07-03 ENCOUNTER — Other Ambulatory Visit (HOSPITAL_BASED_OUTPATIENT_CLINIC_OR_DEPARTMENT_OTHER): Payer: Self-pay

## 2024-07-03 ENCOUNTER — Other Ambulatory Visit: Payer: Self-pay

## 2024-07-31 ENCOUNTER — Other Ambulatory Visit: Payer: Self-pay

## 2024-07-31 ENCOUNTER — Other Ambulatory Visit (HOSPITAL_BASED_OUTPATIENT_CLINIC_OR_DEPARTMENT_OTHER): Payer: Self-pay

## 2024-07-31 MED ORDER — METHYLPHENIDATE HCL 10 MG PO TABS
15.0000 mg | ORAL_TABLET | Freq: Every morning | ORAL | 0 refills | Status: AC
  Filled 2024-07-31 – 2024-08-04 (×2): qty 45, 30d supply, fill #0
  Filled ????-??-??: fill #0

## 2024-07-31 MED ORDER — METHYLPHENIDATE HCL 10 MG PO TABS: 15.0000 mg | ORAL_TABLET | Freq: Every morning | ORAL | 0 refills | Status: AC

## 2024-07-31 MED ORDER — PRAZOSIN HCL 2 MG PO CAPS
2.0000 mg | ORAL_CAPSULE | Freq: Every day | ORAL | 0 refills | Status: AC
  Filled 2024-07-31: qty 30, 30d supply, fill #0

## 2024-07-31 MED ORDER — HYDROXYZINE HCL 10 MG PO TABS
10.0000 mg | ORAL_TABLET | Freq: Two times a day (BID) | ORAL | 0 refills | Status: AC | PRN
  Filled 2024-07-31 (×2): qty 60, 30d supply, fill #0

## 2024-07-31 MED ORDER — METHYLPHENIDATE HCL 10 MG PO TABS
15.0000 mg | ORAL_TABLET | Freq: Every morning | ORAL | 0 refills | Status: AC
  Filled 2024-09-29: qty 45, 30d supply, fill #0

## 2024-07-31 MED ORDER — BUPROPION HCL ER (XL) 300 MG PO TB24
300.0000 mg | ORAL_TABLET | Freq: Every day | ORAL | 0 refills | Status: DC
  Filled 2024-07-31: qty 30, 30d supply, fill #0
  Filled 2024-08-25: qty 30, 30d supply, fill #1
  Filled 2024-09-25: qty 30, 30d supply, fill #2

## 2024-08-01 ENCOUNTER — Other Ambulatory Visit (HOSPITAL_BASED_OUTPATIENT_CLINIC_OR_DEPARTMENT_OTHER): Payer: Self-pay

## 2024-08-04 ENCOUNTER — Other Ambulatory Visit (HOSPITAL_BASED_OUTPATIENT_CLINIC_OR_DEPARTMENT_OTHER): Payer: Self-pay

## 2024-08-07 ENCOUNTER — Other Ambulatory Visit (HOSPITAL_BASED_OUTPATIENT_CLINIC_OR_DEPARTMENT_OTHER): Payer: Self-pay

## 2024-08-21 ENCOUNTER — Other Ambulatory Visit (HOSPITAL_BASED_OUTPATIENT_CLINIC_OR_DEPARTMENT_OTHER): Payer: Self-pay

## 2024-09-01 ENCOUNTER — Other Ambulatory Visit (HOSPITAL_BASED_OUTPATIENT_CLINIC_OR_DEPARTMENT_OTHER): Payer: Self-pay

## 2024-09-04 ENCOUNTER — Other Ambulatory Visit (HOSPITAL_BASED_OUTPATIENT_CLINIC_OR_DEPARTMENT_OTHER): Payer: Self-pay

## 2024-09-14 ENCOUNTER — Other Ambulatory Visit (HOSPITAL_BASED_OUTPATIENT_CLINIC_OR_DEPARTMENT_OTHER): Payer: Self-pay

## 2024-09-15 ENCOUNTER — Telehealth: Payer: Self-pay | Admitting: Family Medicine

## 2024-09-15 NOTE — Telephone Encounter (Signed)
 Copied from CRM #[id number]. Topic: Medicare AWV >> Sep 15, 2024  9:35 AM Nathanel DEL wrote: Called LVM 09/15/2024 to sched AWV. Please schedule in office or virtual visit.   Nathanel Paschal; Care Guide Ambulatory Clinical Support [place] [facility] Direct Dial: [phone]

## 2024-09-29 ENCOUNTER — Other Ambulatory Visit (HOSPITAL_BASED_OUTPATIENT_CLINIC_OR_DEPARTMENT_OTHER): Payer: Self-pay

## 2024-10-09 ENCOUNTER — Other Ambulatory Visit (HOSPITAL_BASED_OUTPATIENT_CLINIC_OR_DEPARTMENT_OTHER): Payer: Self-pay

## 2024-10-30 ENCOUNTER — Other Ambulatory Visit: Payer: Self-pay

## 2024-10-30 ENCOUNTER — Other Ambulatory Visit (HOSPITAL_BASED_OUTPATIENT_CLINIC_OR_DEPARTMENT_OTHER): Payer: Self-pay

## 2024-10-30 MED ORDER — HYDROXYZINE HCL 10 MG PO TABS
10.0000 mg | ORAL_TABLET | Freq: Two times a day (BID) | ORAL | 0 refills | Status: AC | PRN
  Filled 2024-10-30: qty 60, 30d supply, fill #0

## 2024-10-30 MED ORDER — BUPROPION HCL ER (XL) 300 MG PO TB24
300.0000 mg | ORAL_TABLET | Freq: Every day | ORAL | 0 refills | Status: AC
  Filled 2024-10-30: qty 30, 30d supply, fill #0
  Filled 2024-11-23: qty 30, 30d supply, fill #1

## 2024-10-30 MED ORDER — PRAZOSIN HCL 2 MG PO CAPS
2.0000 mg | ORAL_CAPSULE | Freq: Every day | ORAL | 0 refills | Status: AC
  Filled 2024-10-30: qty 30, 30d supply, fill #0

## 2024-10-30 MED ORDER — METHYLPHENIDATE HCL 10 MG PO TABS
10.0000 mg | ORAL_TABLET | Freq: Every morning | ORAL | 0 refills | Status: AC
  Filled 2024-10-30: qty 45, 30d supply, fill #0

## 2024-10-30 MED ORDER — METHYLPHENIDATE HCL 10 MG PO TABS: 15.0000 mg | ORAL_TABLET | Freq: Every morning | ORAL | 0 refills | Status: AC

## 2024-10-30 MED ORDER — METHYLPHENIDATE HCL 10 MG PO TABS
15.0000 mg | ORAL_TABLET | Freq: Every morning | ORAL | 0 refills | Status: AC
  Filled 2024-11-28: qty 45, 30d supply, fill #0

## 2024-11-06 ENCOUNTER — Ambulatory Visit: Admitting: Family Medicine

## 2024-11-28 ENCOUNTER — Ambulatory Visit: Admitting: Family Medicine

## 2024-11-28 ENCOUNTER — Other Ambulatory Visit (HOSPITAL_BASED_OUTPATIENT_CLINIC_OR_DEPARTMENT_OTHER): Payer: Self-pay

## 2024-11-28 ENCOUNTER — Encounter: Payer: Self-pay | Admitting: Family Medicine

## 2024-11-28 VITALS — BP 126/77 | HR 114 | Temp 98.1°F | Ht 62.0 in | Wt 168.2 lb

## 2024-11-28 DIAGNOSIS — K130 Diseases of lips: Secondary | ICD-10-CM

## 2024-11-28 DIAGNOSIS — J069 Acute upper respiratory infection, unspecified: Secondary | ICD-10-CM

## 2024-11-28 MED ORDER — AZITHROMYCIN 250 MG PO TABS
ORAL_TABLET | ORAL | 0 refills | Status: AC
  Filled 2024-11-28: qty 6, 5d supply, fill #0

## 2024-11-28 MED ORDER — MAGIC MOUTHWASH W/LIDOCAINE: 5.0000 mL | Freq: Four times a day (QID) | ORAL | 0 refills | Status: AC | PRN

## 2024-11-28 MED ORDER — NYSTATIN 100000 UNIT/ML MT SUSP
5.0000 mL | Freq: Four times a day (QID) | OROMUCOSAL | 0 refills | Status: AC | PRN
  Filled 2024-11-28: qty 240, 12d supply, fill #0

## 2024-11-29 ENCOUNTER — Other Ambulatory Visit (HOSPITAL_BASED_OUTPATIENT_CLINIC_OR_DEPARTMENT_OTHER): Payer: Self-pay

## 2024-11-29 ENCOUNTER — Ambulatory Visit: Admitting: Family Medicine

## 2024-11-29 ENCOUNTER — Encounter: Payer: Self-pay | Admitting: Family Medicine

## 2024-11-29 VITALS — BP 130/82 | HR 100 | Temp 98.0°F | Resp 16 | Ht 62.0 in | Wt 168.0 lb

## 2024-11-29 DIAGNOSIS — H6993 Unspecified Eustachian tube disorder, bilateral: Secondary | ICD-10-CM

## 2024-11-29 DIAGNOSIS — K219 Gastro-esophageal reflux disease without esophagitis: Secondary | ICD-10-CM

## 2024-11-29 DIAGNOSIS — R051 Acute cough: Secondary | ICD-10-CM

## 2024-11-29 DIAGNOSIS — B001 Herpesviral vesicular dermatitis: Secondary | ICD-10-CM

## 2024-11-29 MED ORDER — PANTOPRAZOLE SODIUM 40 MG PO TBEC
40.0000 mg | DELAYED_RELEASE_TABLET | Freq: Every day | ORAL | 2 refills | Status: AC
  Filled 2024-11-29: qty 30, 30d supply, fill #0

## 2024-11-29 MED ORDER — METHYLPREDNISOLONE ACETATE 80 MG/ML IJ SUSP
80.0000 mg | Freq: Once | INTRAMUSCULAR | Status: AC
  Administered 2024-11-29: 80 mg via INTRAMUSCULAR

## 2024-11-29 MED ORDER — VALACYCLOVIR HCL 1 G PO TABS
ORAL_TABLET | ORAL | 1 refills | Status: AC
  Filled 2024-11-29: qty 30, 8d supply, fill #0
  Filled 2024-11-30 – 2024-12-01 (×2): qty 30, 8d supply, fill #1

## 2024-11-29 MED ORDER — FLUCONAZOLE 150 MG PO TABS
ORAL_TABLET | ORAL | 0 refills | Status: AC
  Filled 2024-11-29: qty 2, 3d supply, fill #0

## 2024-11-29 NOTE — Progress Notes (Signed)
 Chief Complaint  Patient presents with   Fatigue    Fatigue     Megan Gonzalez here for URI complaints.  Duration: 1 week  Associated symptoms: subjective fever, sinus congestion, rhinorrhea, itchy watery eyes, ear pain, shortness of breath, and fatigue Denies: sinus pain, ear drainage, sore throat, wheezing, myalgia, and N/V/D Treatment to date: Tylenol  Cold/Flu, Sudafed, Zpak Sick contacts: No  Past Medical History:  Diagnosis Date   Anemia    Anxiety    Arthritis    chronic low back pain   Cancer (HCC)    cervical cancer; no more uterus   Depression    Fecal incontinence    Headache(784.0)    Numbness    Overactive bladder    Sleep apnea    CPAP-non compliant   Tremor     Objective BP 130/82 (BP Location: Left Arm, Patient Position: Sitting)   Pulse 100   Temp 98 F (36.7 C) (Oral)   Resp 16   Ht 5' 2 (1.575 m)   Wt 168 lb (76.2 kg)   LMP 04/24/2012   SpO2 97%   BMI 30.73 kg/m  General: Awake, alert, appears stated age HEENT: AT, [place], ears patent b/l and TM's neg, nares patent w/o discharge, pharynx pink and without exudates, MMM, no sinus ttp b/l Neck: No masses or asymmetry Heart: RRR Lungs: CTAB, no accessory muscle use Psych: Age appropriate judgment and insight, normal mood and affect  Dysfunction of both eustachian tubes  Cold sore - Plan: valACYclovir  (VALTREX ) 1000 MG tablet  Acute cough - Plan: methylPREDNISolone  acetate (DEPO-MEDROL ) injection 80 mg  Gastroesophageal reflux disease, unspecified whether esophagitis present - Plan: pantoprazole  (PROTONIX ) 40 MG tablet  Depomedrol injection today for above. Cold sore management as above. Finish abx, will send Diflucan  for prn use. Continue to push fluids, practice good hand hygiene, cover mouth when coughing. F/u prn. If starting to experience worsening symptoms, shaking, or shortness of breath, seek immediate care. Pt voiced understanding and agreement to the plan.  [name]  [place], DO 11/29/24 2:29 PM

## 2024-11-29 NOTE — Patient Instructions (Addendum)
 Continue to push fluids, practice good hand hygiene, and cover your mouth if you cough.  If you start having fevers, shaking or shortness of breath, seek immediate care.  OK to take Tylenol  1000 mg (2 extra strength tabs) or 975 mg (3 regular strength tabs) every 6 hours as needed.  Take 1 tab twice daily for 7 days for the cold sore initially.  Let us  know if you need anything.

## 2024-11-30 ENCOUNTER — Other Ambulatory Visit (HOSPITAL_BASED_OUTPATIENT_CLINIC_OR_DEPARTMENT_OTHER): Payer: Self-pay

## 2024-12-01 ENCOUNTER — Other Ambulatory Visit (HOSPITAL_BASED_OUTPATIENT_CLINIC_OR_DEPARTMENT_OTHER): Payer: Self-pay
